# Patient Record
Sex: Female | Born: 1935 | Race: Black or African American | Hispanic: No | Smoking: Never smoker
Health system: Southern US, Community
[De-identification: ages and names within clinical notes are randomized; demographics above are authoritative.]

## PROBLEM LIST (undated history)

## (undated) DIAGNOSIS — I10 Essential (primary) hypertension: Secondary | ICD-10-CM

## (undated) DIAGNOSIS — D649 Anemia, unspecified: Secondary | ICD-10-CM

## (undated) DIAGNOSIS — E785 Hyperlipidemia, unspecified: Secondary | ICD-10-CM

## (undated) DIAGNOSIS — I2510 Atherosclerotic heart disease of native coronary artery without angina pectoris: Secondary | ICD-10-CM

## (undated) DIAGNOSIS — C189 Malignant neoplasm of colon, unspecified: Secondary | ICD-10-CM

## (undated) DIAGNOSIS — G629 Polyneuropathy, unspecified: Secondary | ICD-10-CM

## (undated) DIAGNOSIS — K219 Gastro-esophageal reflux disease without esophagitis: Secondary | ICD-10-CM

## (undated) DIAGNOSIS — R7303 Prediabetes: Secondary | ICD-10-CM

## (undated) DIAGNOSIS — C8330 Diffuse large B-cell lymphoma, unspecified site: Secondary | ICD-10-CM

## (undated) DIAGNOSIS — E119 Type 2 diabetes mellitus without complications: Secondary | ICD-10-CM

## (undated) DIAGNOSIS — I493 Ventricular premature depolarization: Secondary | ICD-10-CM

## (undated) HISTORY — PX: OTHER SURGICAL HISTORY: SHX169

## (undated) HISTORY — PX: HERNIA REPAIR: SHX51

## (undated) HISTORY — DX: Atherosclerotic heart disease of native coronary artery without angina pectoris: I25.10

## (undated) HISTORY — DX: Prediabetes: R73.03

## (undated) HISTORY — DX: Essential (primary) hypertension: I10

## (undated) HISTORY — DX: Malignant neoplasm of colon, unspecified: C18.9

## (undated) HISTORY — DX: Gastro-esophageal reflux disease without esophagitis: K21.9

## (undated) HISTORY — DX: Anemia, unspecified: D64.9

## (undated) HISTORY — DX: Polyneuropathy, unspecified: G62.9

## (undated) HISTORY — DX: Ventricular premature depolarization: I49.3

## (undated) HISTORY — DX: Hyperlipidemia, unspecified: E78.5

---

## 2005-08-11 ENCOUNTER — Observation Stay (HOSPITAL_COMMUNITY): Admission: EM | Admit: 2005-08-11 | Discharge: 2005-08-12 | Payer: Self-pay | Admitting: Emergency Medicine

## 2005-08-11 ENCOUNTER — Ambulatory Visit: Payer: Self-pay | Admitting: Cardiology

## 2005-12-11 ENCOUNTER — Emergency Department (HOSPITAL_COMMUNITY): Admission: EM | Admit: 2005-12-11 | Discharge: 2005-12-11 | Payer: Self-pay | Admitting: Emergency Medicine

## 2006-03-03 ENCOUNTER — Ambulatory Visit (HOSPITAL_COMMUNITY): Admission: RE | Admit: 2006-03-03 | Discharge: 2006-03-03 | Payer: Self-pay | Admitting: Family Medicine

## 2006-03-10 ENCOUNTER — Ambulatory Visit (HOSPITAL_COMMUNITY): Admission: RE | Admit: 2006-03-10 | Discharge: 2006-03-11 | Payer: Self-pay | Admitting: Family Medicine

## 2006-03-10 ENCOUNTER — Ambulatory Visit: Payer: Self-pay | Admitting: Cardiology

## 2006-03-13 ENCOUNTER — Ambulatory Visit: Payer: Self-pay | Admitting: *Deleted

## 2006-07-18 ENCOUNTER — Ambulatory Visit: Payer: Self-pay | Admitting: Family Medicine

## 2006-08-14 ENCOUNTER — Ambulatory Visit: Payer: Self-pay | Admitting: Family Medicine

## 2006-08-29 ENCOUNTER — Encounter (INDEPENDENT_AMBULATORY_CARE_PROVIDER_SITE_OTHER): Payer: Self-pay | Admitting: Family Medicine

## 2006-08-29 LAB — CONVERTED CEMR LAB
Blood Glucose, Fasting: 85 mg/dL
RBC count: 4.51 10*6/uL
WBC, blood: 5.1 10*3/uL

## 2006-09-11 ENCOUNTER — Ambulatory Visit: Payer: Self-pay | Admitting: Family Medicine

## 2006-09-17 ENCOUNTER — Ambulatory Visit (HOSPITAL_COMMUNITY): Admission: RE | Admit: 2006-09-17 | Discharge: 2006-09-17 | Payer: Self-pay | Admitting: *Deleted

## 2006-10-14 ENCOUNTER — Encounter: Payer: Self-pay | Admitting: Family Medicine

## 2006-10-15 DIAGNOSIS — J309 Allergic rhinitis, unspecified: Secondary | ICD-10-CM | POA: Insufficient documentation

## 2006-10-15 DIAGNOSIS — I10 Essential (primary) hypertension: Secondary | ICD-10-CM

## 2006-10-15 DIAGNOSIS — J40 Bronchitis, not specified as acute or chronic: Secondary | ICD-10-CM | POA: Insufficient documentation

## 2006-10-15 DIAGNOSIS — F411 Generalized anxiety disorder: Secondary | ICD-10-CM | POA: Insufficient documentation

## 2006-10-15 DIAGNOSIS — F329 Major depressive disorder, single episode, unspecified: Secondary | ICD-10-CM

## 2006-10-15 DIAGNOSIS — M545 Low back pain: Secondary | ICD-10-CM

## 2006-10-15 DIAGNOSIS — K219 Gastro-esophageal reflux disease without esophagitis: Secondary | ICD-10-CM

## 2006-10-15 DIAGNOSIS — E785 Hyperlipidemia, unspecified: Secondary | ICD-10-CM

## 2006-10-15 DIAGNOSIS — I252 Old myocardial infarction: Secondary | ICD-10-CM

## 2006-10-15 DIAGNOSIS — I2510 Atherosclerotic heart disease of native coronary artery without angina pectoris: Secondary | ICD-10-CM

## 2006-10-15 DIAGNOSIS — H409 Unspecified glaucoma: Secondary | ICD-10-CM | POA: Insufficient documentation

## 2006-10-23 ENCOUNTER — Ambulatory Visit: Payer: Self-pay | Admitting: Family Medicine

## 2006-11-13 ENCOUNTER — Ambulatory Visit: Payer: Self-pay | Admitting: Family Medicine

## 2006-11-13 LAB — CONVERTED CEMR LAB
AST: 20 units/L (ref 0–37)
Albumin: 4.6 g/dL (ref 3.5–5.2)
Alkaline Phosphatase: 117 units/L (ref 39–117)
BUN: 14 mg/dL (ref 6–23)
Potassium: 3.9 meq/L (ref 3.5–5.3)
Sodium: 143 meq/L (ref 135–145)
Total Protein: 7.6 g/dL (ref 6.0–8.3)

## 2006-12-18 ENCOUNTER — Encounter (INDEPENDENT_AMBULATORY_CARE_PROVIDER_SITE_OTHER): Payer: Self-pay | Admitting: Family Medicine

## 2006-12-29 ENCOUNTER — Ambulatory Visit: Payer: Self-pay | Admitting: Family Medicine

## 2006-12-29 DIAGNOSIS — M899 Disorder of bone, unspecified: Secondary | ICD-10-CM | POA: Insufficient documentation

## 2006-12-29 DIAGNOSIS — M949 Disorder of cartilage, unspecified: Secondary | ICD-10-CM

## 2006-12-29 LAB — CONVERTED CEMR LAB: HDL goal, serum: 40 mg/dL

## 2007-01-05 ENCOUNTER — Telehealth (INDEPENDENT_AMBULATORY_CARE_PROVIDER_SITE_OTHER): Payer: Self-pay | Admitting: Family Medicine

## 2007-01-09 ENCOUNTER — Ambulatory Visit (HOSPITAL_COMMUNITY): Admission: RE | Admit: 2007-01-09 | Discharge: 2007-01-09 | Payer: Self-pay | Admitting: Family Medicine

## 2007-01-12 ENCOUNTER — Encounter (INDEPENDENT_AMBULATORY_CARE_PROVIDER_SITE_OTHER): Payer: Self-pay | Admitting: Family Medicine

## 2007-01-26 ENCOUNTER — Ambulatory Visit: Payer: Self-pay | Admitting: Family Medicine

## 2007-01-28 ENCOUNTER — Encounter (INDEPENDENT_AMBULATORY_CARE_PROVIDER_SITE_OTHER): Payer: Self-pay | Admitting: Family Medicine

## 2007-03-16 ENCOUNTER — Ambulatory Visit: Payer: Self-pay | Admitting: Family Medicine

## 2007-03-17 ENCOUNTER — Encounter (INDEPENDENT_AMBULATORY_CARE_PROVIDER_SITE_OTHER): Payer: Self-pay | Admitting: Family Medicine

## 2007-03-17 LAB — CONVERTED CEMR LAB
AST: 21 units/L (ref 0–37)
Albumin: 4.2 g/dL (ref 3.5–5.2)
BUN: 12 mg/dL (ref 6–23)
Calcium: 8.9 mg/dL (ref 8.4–10.5)
Chloride: 109 meq/L (ref 96–112)
Potassium: 3.5 meq/L (ref 3.5–5.3)

## 2007-04-27 ENCOUNTER — Encounter (INDEPENDENT_AMBULATORY_CARE_PROVIDER_SITE_OTHER): Payer: Self-pay | Admitting: Family Medicine

## 2007-11-24 ENCOUNTER — Encounter (INDEPENDENT_AMBULATORY_CARE_PROVIDER_SITE_OTHER): Payer: Self-pay | Admitting: Family Medicine

## 2007-12-18 ENCOUNTER — Ambulatory Visit (HOSPITAL_COMMUNITY): Admission: RE | Admit: 2007-12-18 | Discharge: 2007-12-18 | Payer: Self-pay | Admitting: Family Medicine

## 2007-12-26 ENCOUNTER — Emergency Department (HOSPITAL_COMMUNITY): Admission: EM | Admit: 2007-12-26 | Discharge: 2007-12-27 | Payer: Self-pay | Admitting: Emergency Medicine

## 2008-01-01 ENCOUNTER — Encounter (INDEPENDENT_AMBULATORY_CARE_PROVIDER_SITE_OTHER): Payer: Self-pay | Admitting: Family Medicine

## 2008-01-22 ENCOUNTER — Encounter (INDEPENDENT_AMBULATORY_CARE_PROVIDER_SITE_OTHER): Payer: Self-pay | Admitting: Family Medicine

## 2008-01-31 ENCOUNTER — Emergency Department (HOSPITAL_COMMUNITY): Admission: EM | Admit: 2008-01-31 | Discharge: 2008-01-31 | Payer: Self-pay | Admitting: Emergency Medicine

## 2008-10-19 ENCOUNTER — Emergency Department (HOSPITAL_COMMUNITY): Admission: EM | Admit: 2008-10-19 | Discharge: 2008-10-19 | Payer: Self-pay | Admitting: Emergency Medicine

## 2009-02-03 ENCOUNTER — Ambulatory Visit (HOSPITAL_COMMUNITY): Admission: RE | Admit: 2009-02-03 | Discharge: 2009-02-03 | Payer: Self-pay | Admitting: Family Medicine

## 2009-07-04 ENCOUNTER — Emergency Department (HOSPITAL_COMMUNITY): Admission: EM | Admit: 2009-07-04 | Discharge: 2009-07-04 | Payer: Self-pay | Admitting: Emergency Medicine

## 2009-12-25 ENCOUNTER — Inpatient Hospital Stay (HOSPITAL_COMMUNITY): Admission: EM | Admit: 2009-12-25 | Discharge: 2009-12-28 | Payer: Self-pay | Admitting: Emergency Medicine

## 2009-12-27 ENCOUNTER — Encounter (INDEPENDENT_AMBULATORY_CARE_PROVIDER_SITE_OTHER): Payer: Self-pay | Admitting: Internal Medicine

## 2010-01-23 ENCOUNTER — Ambulatory Visit (HOSPITAL_COMMUNITY): Admission: RE | Admit: 2010-01-23 | Discharge: 2010-01-23 | Payer: Self-pay | Admitting: Family Medicine

## 2010-02-09 ENCOUNTER — Encounter: Admission: RE | Admit: 2010-02-09 | Discharge: 2010-02-09 | Payer: Self-pay | Admitting: Family Medicine

## 2010-09-19 ENCOUNTER — Emergency Department (HOSPITAL_COMMUNITY): Admission: EM | Admit: 2010-09-19 | Discharge: 2010-09-19 | Payer: Self-pay | Admitting: Emergency Medicine

## 2011-01-22 LAB — URINALYSIS, ROUTINE W REFLEX MICROSCOPIC
Glucose, UA: NEGATIVE mg/dL
Specific Gravity, Urine: 1.015 (ref 1.005–1.030)
pH: 6.5 (ref 5.0–8.0)

## 2011-01-22 LAB — CBC
Platelets: 172 10*3/uL (ref 150–400)
RBC: 4.13 MIL/uL (ref 3.87–5.11)
RDW: 14.3 % (ref 11.5–15.5)
WBC: 5.4 10*3/uL (ref 4.0–10.5)

## 2011-01-22 LAB — DIFFERENTIAL
Basophils Absolute: 0 10*3/uL (ref 0.0–0.1)
Eosinophils Absolute: 0.2 10*3/uL (ref 0.0–0.7)
Lymphocytes Relative: 37 % (ref 12–46)
Lymphs Abs: 2 10*3/uL (ref 0.7–4.0)
Neutrophils Relative %: 52 % (ref 43–77)

## 2011-01-22 LAB — BASIC METABOLIC PANEL
Calcium: 8.3 mg/dL — ABNORMAL LOW (ref 8.4–10.5)
Creatinine, Ser: 0.88 mg/dL (ref 0.4–1.2)
GFR calc Af Amer: >60 mL/min (ref >60)

## 2011-01-22 LAB — URINE CULTURE: Culture  Setup Time: 201111110334

## 2011-01-22 LAB — URINE MICROSCOPIC-ADD ON

## 2011-01-30 LAB — BASIC METABOLIC PANEL
CO2: 25 mEq/L (ref 19–32)
Calcium: 9.4 mg/dL (ref 8.4–10.5)
Chloride: 108 mEq/L (ref 96–112)
GFR calc Af Amer: >60 mL/min (ref >60)
Sodium: 141 mEq/L (ref 135–145)

## 2011-01-30 LAB — CBC
HCT: 35.9 % — ABNORMAL LOW (ref 36.0–46.0)
Hemoglobin: 12.1 g/dL (ref 12.0–15.0)
Hemoglobin: 12.2 g/dL (ref 12.0–15.0)
MCHC: 33.3 g/dL (ref 30.0–36.0)
MCHC: 33.6 g/dL (ref 30.0–36.0)
MCV: 83.3 fL (ref 78.0–100.0)
RBC: 4.37 MIL/uL (ref 3.87–5.11)
RDW: 14.3 % (ref 11.5–15.5)
WBC: 5.1 10*3/uL (ref 4.0–10.5)

## 2011-01-30 LAB — LIPID PANEL
LDL Cholesterol: 212 mg/dL — ABNORMAL HIGH (ref 0–99)
Total CHOL/HDL Ratio: 5 RATIO
Triglycerides: 93 mg/dL (ref <150)
VLDL: 19 mg/dL (ref 0–40)

## 2011-01-30 LAB — DIFFERENTIAL
Basophils Relative: 1 % (ref 0–1)
Basophils Relative: 1 % (ref 0–1)
Lymphs Abs: 2 10*3/uL (ref 0.7–4.0)
Monocytes Absolute: 0.3 10*3/uL (ref 0.1–1.0)
Monocytes Relative: 5 % (ref 3–12)
Monocytes Relative: 7 % (ref 3–12)
Neutro Abs: 1.7 10*3/uL (ref 1.7–7.7)
Neutro Abs: 2.7 10*3/uL (ref 1.7–7.7)
Neutrophils Relative %: 43 % (ref 43–77)
Neutrophils Relative %: 53 % (ref 43–77)

## 2011-01-30 LAB — URINE MICROSCOPIC-ADD ON

## 2011-01-30 LAB — PHOSPHORUS
Phosphorus: 3.6 mg/dL (ref 2.3–4.6)
Phosphorus: 3.9 mg/dL (ref 2.3–4.6)

## 2011-01-30 LAB — CK TOTAL AND CKMB (NOT AT ARMC)
CK, MB: 2.3 ng/mL (ref 0.3–4.0)
CK, MB: 2.9 ng/mL (ref 0.3–4.0)
Relative Index: 2 (ref 0.0–2.5)
Total CK: 132 U/L (ref 7–177)

## 2011-01-30 LAB — URINE CULTURE

## 2011-01-30 LAB — COMPREHENSIVE METABOLIC PANEL
Alkaline Phosphatase: 79 U/L (ref 39–117)
BUN: 11 mg/dL (ref 6–23)
Calcium: 9 mg/dL (ref 8.4–10.5)
Creatinine, Ser: 0.71 mg/dL (ref 0.4–1.2)
Glucose, Bld: 92 mg/dL (ref 70–99)
Potassium: 3.9 mEq/L (ref 3.5–5.1)
Total Protein: 6.6 g/dL (ref 6.0–8.3)

## 2011-01-30 LAB — URINALYSIS, ROUTINE W REFLEX MICROSCOPIC
Hgb urine dipstick: NEGATIVE
Nitrite: NEGATIVE
Specific Gravity, Urine: <1.005 — ABNORMAL LOW (ref 1.005–1.030)
Urobilinogen, UA: 0.2 mg/dL (ref 0.0–1.0)

## 2011-01-30 LAB — HEMOGLOBIN A1C: Hgb A1c MFr Bld: 6.3 % — ABNORMAL HIGH (ref 4.6–6.1)

## 2011-01-30 LAB — TSH: TSH: 2.777 u[IU]/mL (ref 0.350–4.500)

## 2011-01-30 LAB — VITAMIN B12: Vitamin B-12: 580 pg/mL (ref 211–911)

## 2011-01-30 LAB — POCT CARDIAC MARKERS
CKMB, poc: 1.7 ng/mL (ref 1.0–8.0)
Myoglobin, poc: 98 ng/mL (ref 12–200)
Troponin i, poc: <0.05 ng/mL (ref 0.00–0.09)

## 2011-02-16 LAB — URINALYSIS, ROUTINE W REFLEX MICROSCOPIC
Glucose, UA: NEGATIVE mg/dL
Nitrite: NEGATIVE
Protein, ur: NEGATIVE mg/dL
pH: 6 (ref 5.0–8.0)

## 2011-02-16 LAB — COMPREHENSIVE METABOLIC PANEL
BUN: 10 mg/dL (ref 6–23)
Calcium: 9.2 mg/dL (ref 8.4–10.5)
Creatinine, Ser: 0.86 mg/dL (ref 0.4–1.2)
Glucose, Bld: 106 mg/dL — ABNORMAL HIGH (ref 70–99)
Total Protein: 6.8 g/dL (ref 6.0–8.3)

## 2011-02-16 LAB — TROPONIN I: Troponin I: 0.03 ng/mL (ref 0.00–0.06)

## 2011-02-16 LAB — DIFFERENTIAL
Basophils Relative: 1 % (ref 0–1)
Lymphs Abs: 1.7 10*3/uL (ref 0.7–4.0)
Monocytes Relative: 7 % (ref 3–12)
Neutro Abs: 2.9 10*3/uL (ref 1.7–7.7)
Neutrophils Relative %: 56 % (ref 43–77)

## 2011-02-16 LAB — CK TOTAL AND CKMB (NOT AT ARMC)
CK, MB: 2.1 ng/mL (ref 0.3–4.0)
Total CK: 149 U/L (ref 7–177)

## 2011-02-16 LAB — CBC
MCHC: 34.1 g/dL (ref 30.0–36.0)
Platelets: 175 10*3/uL (ref 150–400)
RBC: 4.23 MIL/uL (ref 3.87–5.11)
RDW: 14.7 % (ref 11.5–15.5)

## 2011-03-29 NOTE — Procedures (Signed)
Evelyn Baird, Evelyn Baird                 ACCOUNT NO.:  [id number]   MEDICAL RECORD NO.:  [id number]          PATIENT TYPE:  OUT   LOCATION:  RAD                           FACILITY:  [facility]   PHYSICIAN:  [place] Bing, M.D. [facility] OF BIRTH:  1930/05/05   DATE OF PROCEDURE:  03/10/2006  DATE OF DISCHARGE:                                  ECHOCARDIOGRAM   REFERRING:  Dr. Loleta Chance.   CLINICAL DATA:  A 77 year old woman with hypertension and dizziness.   Aorta 3.  Left atrium 4.3.  Septum 1.4.  Posterior wall 1.3.  LV diastole  4.2.  LV systole 3.4.   IMPRESSION:  1.  Technically suboptimal but adequate echocardiographic study.  2.  Mild left atrial enlargement.  The right ventricle is prominent without      hypertrophy and with normal systolic function.  3.  Moderate aortic valvular sclerosis with mild impairment and leaflet      separation.  No stenosis by Doppler.  4.  Normal diameter of the aortic root with mild calcification of the wall.  5.  Normal tricuspid valve.  Mild regurgitation.  Normal estimated right      ventricular systolic pressure.  6.  Normal pulmonic valve and proximal pulmonary artery.  7.  Normal mitral valve.  Mild-to-moderate annular calcification.  Phylactic      coaptation with trivial regurgitation and no definite prolapse.  8.  Normal left ventricular size.  Mild hypertrophy.  Normal regional and      global function.  9.  Normal inferior vena cava.      [place] Bing, M.D. [facility]  Electronically Signed     RR/MEDQ  D:  03/10/2006  T:  03/11/2006  Job:  [id number]

## 2011-03-29 NOTE — H&P (Signed)
Evelyn Baird, Evelyn Baird                 ACCOUNT NO.:  [id number]   MEDICAL RECORD NO.:  [id number]          PATIENT TYPE:  INP   LOCATION:  A222                          FACILITY:  [facility]   PHYSICIAN:  Osvaldo Shipper, MD     DATE OF BIRTH:  75-Aug-1931   DATE OF ADMISSION:  08/11/2005  DATE OF DISCHARGE:  LH                                HISTORY & PHYSICAL   PRIMARY CARE PHYSICIAN:  Dr. Jorene Guest in [place].   ADMISSION DIAGNOSES:  1.  Near syncope.  2.  Pulsatile tinnitus.  3.  History of coronary artery disease.  4.  Hypertension.   CHIEF COMPLAINT:  Near syncopal episode last night.   HISTORY OF PRESENT ILLNESS:  The patient is a 77 year old African-American  female with past medical history of hypertension, coronary artery disease  who presented to the emergency department today after having a near syncopal  episode yesterday. The patient was in a store doing grocery shopping, when  she suddenly felt light headed. The patient denied having symptoms of  vertigo and she felt like she was going to pass out. She fell backwards,  more towards her left side but did not hurt her head anywhere. There was no  syncopal episode, however. The patient maintained that she remained  conscious throughout the process. However, did feel profoundly light headed.  She also complained of a mild headache at that time but nothing severe. The  patient gives a significant history of tinnitus, more so in her right ear,  which is pulsatile in nature, which she has been having for a few weeks on  and off. She did feel this sensation yesterday. The patient subsequent sat  down on the floor. She was helped to her feet by her daughter. Subsequently  was taken to a hospital. However, she had a long waiting period in that  hospital and she was taken to another hospital in [place]. The patient was  seen apparently by a physician, did not have any tests done over there and  was discharged home. This morning, the  patient woke up and still felt that  her symptoms had not gone away. She was still feeling light headed and  decided to come into this emergency department. There is no history  suggestive of vertigo. At no point did she have any chest pain,  palpitations, or shortness of breath. The patient does give a history of  dyspnea on exertion, however. There was no history of any seizure type of  activity. No history of urinary or stool incontinence. There is no history  of any focal weakness. No history of nausea, vomiting, or abdominal pain. No  history of any dysuria or hematuria.   PAST MEDICAL HISTORY:  The patient had a hysterectomy with bilateral  salpingo-oophorectomy along with a hernia repair about a few months ago.   MEDICATIONS:  The patient is not compliant with her medications. She does  not know the names of most of her medications. She states that she is  supposed to be taking a lot of medications, however, she has discontinued  them. She is currently on the following:  1.  Nitroglycerin for chest pain. The last used was Friday.  2.  Ibuprofen p.r.n.  3.  Zetia unknown dose daily, however, she has not been taking it for a      weak, as she has been feeling sick to her stomach.  Her pharmacy is [facility] in [place]. The phone number is [phone]. I did call them, however, they are closed at this time. The patient's  daughters also do not know the names of her medications.   ALLERGIES:  No known drug allergies.   SOCIAL HISTORY:  The patient lives in [place]. Lives alone. However, she  ambulates independently and does all of her activities of daily living  herself. No history of any alcohol use or any drug use. No recent history of  any smoking use. She used to be a smoker in the past, on and off, but never  a regular smoker.   FAMILY HISTORY:  Kidney disease in father. Otherwise, the patient does not  know much medical problems in her family.   REVIEW OF  SYSTEMS:  A 10 point review of systems was done, which was  remarkable for persistent symptoms of light headedness as well as  intermittent pulsatile tinnitus.   PHYSICAL EXAMINATION:  VITAL SIGNS:  Temperature 99.1, blood pressure  initially 151/67. Occasional she is known to become hypertensive with 150's  over 100. The patient did have orthostatic done, which are as follows:  Lying down blood pressure 158/89, heart rate 61. Sitting up blood pressure  149/86, heart rate 68. Standing up blood pressure 137/92, heart rate 77.  Respiratory rate is 14. Saturation 97% on room air.  GENERAL:  A well developed, well nourished female in no apparent distress.  HEENT:  No pallor, no icterus. Oral mucus membranes moist. No oral lesions  are seen. Examination of the ears bilaterally did not reveal any blood.  Tympanic membranes were present with good light reflex.  NECK:  Soft and supple with full range of motion with no reproducibility of  her symptoms.  LUNGS:  Few crackles at the base, clearing with cough. Otherwise,  unremarkable.  CARDIOVASCULAR:  S1 and S2 is normal with a few premature beats, consistent  with premature ventricular contractions as seen on telemetry. There is a  systolic murmur appreciated in the aortic area. Does not seem to radiate to  the carotids, however. No S3 or S4 heard. No carotid bruits were heard. No  jugular venous distention was seen.  ABDOMEN:  Soft, nontender, and nondistended. Bowel sounds are present. No  mass or organomegaly appreciated. Please note that there is some diffuse  mild tenderness present but there is no guarding or rigidity.  EXTREMITIES:  Without any edema. Peripheral pulses are palpable.  NEUROLOGIC:  The patient is alert and oriented x3. Cranial nerve examination  within normal limits. No nystagmus was noted. Motor examination, the patient had 5 out of 5 strength to both upper and lower extremities bilaterally.  Cerebellar signs were within  normal limits. Gait appeared to be normal.  Romberg sign was negative. Sensory examination within normal limits though  limited somewhat. Tone within normal limits. Reflexes within normal limits,  1+ bilaterally and equal. Plantar's downgoing.   LABORATORY DATA:  The patient has had limited laboratory analysis, however,  includes venous blood gas of pH 7.44, pCO 734, hemoglobin 13.9. No white  count or platelet count available. Sodium 139, potassium 3.8, chloride 110,  glucose 84, BUN 9, creatinine 0.8. No other parameters available. No liver  function studies available. CK MB 1.2, troponin less than 0.05. UA negative  for any abnormalities.   Chest x-ray showed significant for cardiomegaly and tiny nodular densities  in the left upper lung region. Followup recommended in 3 months.   EKG shows sinus rhythm with a normal axis. No significant Q waves  appreciated. Intervals appear to be within normal limits. Premature  ventricular contractions is seen in this electrocardiogram. No significant  ST or T wave abnormalities are noted on this electrocardiogram.   IMPRESSION:  This is a 77 year old African-American female with a past  medical history of coronary artery disease, hypertension, who presents to  the emergency department with an episode of near syncope last night with  symptoms continuing somewhat this morning. Differential diagnoses include  cardiac etiology considering the premature ventricular contractions that are  seen on her electrocardiogram. It is possible that she might have had some  kind of arrhythmia. We do hear a systolic murmur in the aortic area. It is  possible that patient has aortic stenosis, which should also be considered.  Ischemic event is unlikely, although it will be prudent to rule it out. The  patient gives a history of pulsatile tinnitus, which is concerning for  vascular malformations intracranially. The patient did have orthostatic's  done, which  suggest mild orthostasis, although she did not have any symptoms  when this testing was done.   PLAN:  NEAR SYNCOPE:  Will get the patient to telemetry. Will do a CT scan  of the head for now to rule out any acute cerebrovascular event. We will  rule the patient out for acute coronary syndrome. Will keep the patient on  telemetry to rule out any arrhythmias. A 2-D echocardiogram will be checked  to evaluate her enlarged heart, as well as the murmur heard on examination  to rule out significant valvular disease. The pulsatile tinnitus is  concerning, hence MRI and MRA will be ordered for tomorrow morning.   The patient is not very compliant with her medications. We are unable to get  her medication list at this time. I did try to call the pharmacy but was  unable to reach them because they are closed. The number is mentioned earlier in this dictation and attempt should be made tomorrow to get her  medication list. I have also asked her family to bring in her medications.  The patient will be started on an anti-hypertensive if her blood pressure  stays elevated tonight. Further management decisions will be based on  results of initial testing, as patient responds to treatment.      Osvaldo Shipper, MD  Electronically Signed     GK/MEDQ  D:  08/11/2005  T:  08/11/2005  Job:  [id number]   cc:   Dr. Sheilah Pigeon, [place]

## 2011-03-29 NOTE — Procedures (Signed)
Evelyn Baird, Evelyn Baird                 ACCOUNT NO.:  [id number]   MEDICAL RECORD NO.:  [id number]          PATIENT TYPE:  INP   LOCATION:  A222                          FACILITY:  [facility]   PHYSICIAN:  [place] Bing, M.D. [facility] OF BIRTH:  [birth year]   DATE OF PROCEDURE:  08/12/2005  DATE OF DISCHARGE:  08/12/2005                                  ECHOCARDIOGRAM   REFERRING:  Osvaldo Shipper, M.D.   CLINICAL DATA:  A 77 year old woman with syncope, hypertension and coronary  disease.   M-MODE:  Aorta 3.3, left atrium 4.6, septum 1.2, posterior wall 1.3, LV  diastole 4.9, LV systole 3.8.   1.  Technically adequate echocardiographic study.  2.  Left atrial size at the upper limit of normal; normal right atrium.  3.  Mild right ventricular dilatation; no right ventricular hypertrophy;      normal right ventricular systolic function.  4.  Trileaflet aortic valve; moderate sclerosis of the leaflets; minimally      decreased leaflet excursion. No stenosis nor insufficiency by Doppler.  5.  Normal mitral valve; mild annular calcification; trace regurgitation.  6.  Normal tricuspid valve; physiologic regurgitation; estimated right      ventricular systolic pressure at the upper limit of normal to slightly      increased.  7.  Normal pulmonic valve and proximal pulmonary artery.  8.  Normal left ventricular size; borderline hypertrophy; normal regional      and global function.  9.  Normal inferior vena cava.      [place] Bing, M.D. [facility]  Electronically Signed     RR/MEDQ  D:  08/12/2005  T:  08/12/2005  Job:  [id number]

## 2011-07-31 ENCOUNTER — Other Ambulatory Visit (HOSPITAL_COMMUNITY): Payer: Self-pay | Admitting: Family Medicine

## 2011-07-31 DIAGNOSIS — M79605 Pain in left leg: Secondary | ICD-10-CM

## 2011-08-02 ENCOUNTER — Ambulatory Visit (HOSPITAL_COMMUNITY): Payer: PRIVATE HEALTH INSURANCE

## 2011-08-02 LAB — POCT CARDIAC MARKERS
CKMB, poc: 1.2
CKMB, poc: 1.7
Myoglobin, poc: 57.9
Myoglobin, poc: 59.1
Operator id: 208461
Operator id: 215201
Troponin i, poc: <0.05
Troponin i, poc: <0.05

## 2011-08-02 LAB — BASIC METABOLIC PANEL WITH GFR
BUN: 11
CO2: 27
Calcium: 9
Chloride: 107
Creatinine, Ser: 0.73
GFR calc non Af Amer: >60
Glucose, Bld: 104 — ABNORMAL HIGH
Potassium: 3.4 — ABNORMAL LOW
Sodium: 140

## 2011-08-02 LAB — CBC
HCT: 31.5 — ABNORMAL LOW
Hemoglobin: 10.9 — ABNORMAL LOW
MCHC: 34.7
MCV: 81.4
Platelets: 215
RBC: 3.87
RDW: 14.3
WBC: 5.5

## 2011-08-02 LAB — DIFFERENTIAL
Eosinophils Absolute: 0.2
Lymphocytes Relative: 42
Lymphs Abs: 2.3
Monocytes Relative: 8
Neutro Abs: 2.5
Neutrophils Relative %: 46

## 2011-08-05 LAB — BASIC METABOLIC PANEL
BUN: 7
Chloride: 107
GFR calc Af Amer: >60
Potassium: 3.6
Sodium: 139

## 2011-08-05 LAB — DIFFERENTIAL
Eosinophils Absolute: 0.2
Eosinophils Relative: 4
Lymphocytes Relative: 31
Lymphs Abs: 1.5
Monocytes Relative: 7

## 2011-08-05 LAB — CBC
HCT: 33.7 — ABNORMAL LOW
Hemoglobin: 11.6 — ABNORMAL LOW
MCV: 81.6
RBC: 4.13
WBC: 4.9

## 2011-08-05 LAB — POCT CARDIAC MARKERS
Myoglobin, poc: 55
Operator id: 211291
Troponin i, poc: <0.05
Troponin i, poc: <0.05

## 2011-08-16 LAB — POCT CARDIAC MARKERS
CKMB, poc: <1 ng/mL — ABNORMAL LOW (ref 1.0–8.0)
Myoglobin, poc: 54.9 ng/mL (ref 12–200)

## 2011-08-16 LAB — CBC
MCV: 83.9 fL (ref 78.0–100.0)
Platelets: 195 10*3/uL (ref 150–400)
RDW: 14.4 % (ref 11.5–15.5)
WBC: 5.7 10*3/uL (ref 4.0–10.5)

## 2011-08-16 LAB — COMPREHENSIVE METABOLIC PANEL
AST: 24 U/L (ref 0–37)
Albumin: 3.8 g/dL (ref 3.5–5.2)
BUN: 8 mg/dL (ref 6–23)
Chloride: 110 mEq/L (ref 96–112)
Creatinine, Ser: 0.86 mg/dL (ref 0.4–1.2)
GFR calc Af Amer: >60 mL/min (ref >60)
Potassium: 3.6 mEq/L (ref 3.5–5.1)
Total Bilirubin: 0.4 mg/dL (ref 0.3–1.2)
Total Protein: 6.9 g/dL (ref 6.0–8.3)

## 2011-08-16 LAB — URINE MICROSCOPIC-ADD ON

## 2011-08-16 LAB — URINALYSIS, ROUTINE W REFLEX MICROSCOPIC
Bilirubin Urine: NEGATIVE
Glucose, UA: NEGATIVE mg/dL
Hgb urine dipstick: NEGATIVE
Specific Gravity, Urine: 1.01 (ref 1.005–1.030)
Urobilinogen, UA: 0.2 mg/dL (ref 0.0–1.0)
pH: 5.5 (ref 5.0–8.0)

## 2011-08-16 LAB — DIFFERENTIAL
Basophils Absolute: 0 10*3/uL (ref 0.0–0.1)
Eosinophils Relative: 3 % (ref 0–5)
Lymphocytes Relative: 33 % (ref 12–46)
Monocytes Absolute: 0.4 10*3/uL (ref 0.1–1.0)
Monocytes Relative: 7 % (ref 3–12)
Neutro Abs: 3.2 10*3/uL (ref 1.7–7.7)

## 2012-02-25 ENCOUNTER — Encounter: Payer: Self-pay | Admitting: Cardiology

## 2012-04-02 ENCOUNTER — Encounter: Payer: Self-pay | Admitting: Cardiology

## 2012-04-03 ENCOUNTER — Encounter: Payer: Self-pay | Admitting: Cardiology

## 2012-04-03 ENCOUNTER — Ambulatory Visit (INDEPENDENT_AMBULATORY_CARE_PROVIDER_SITE_OTHER): Payer: PRIVATE HEALTH INSURANCE | Admitting: Cardiology

## 2012-04-03 VITALS — BP 134/83 | HR 83 | Resp 16 | Ht 65.0 in | Wt 133.0 lb

## 2012-04-03 DIAGNOSIS — E785 Hyperlipidemia, unspecified: Secondary | ICD-10-CM

## 2012-04-03 DIAGNOSIS — I359 Nonrheumatic aortic valve disorder, unspecified: Secondary | ICD-10-CM | POA: Insufficient documentation

## 2012-04-03 DIAGNOSIS — R072 Precordial pain: Secondary | ICD-10-CM

## 2012-04-03 DIAGNOSIS — I10 Essential (primary) hypertension: Secondary | ICD-10-CM

## 2012-04-03 DIAGNOSIS — I2510 Atherosclerotic heart disease of native coronary artery without angina pectoris: Secondary | ICD-10-CM

## 2012-04-03 NOTE — Patient Instructions (Signed)
**Note De-identified Chevonne Bostrom Obfuscation** Your physician has requested that you have an echocardiogram. Echocardiography is a painless test that uses sound waves to create images of your heart. It provides your doctor with information about the size and shape of your heart and how well your heart's chambers and valves are working. This procedure takes approximately one hour. There are no restrictions for this procedure.  Your physician recommends that you schedule a follow-up appointment in: 6 months.  

## 2012-04-03 NOTE — Assessment & Plan Note (Signed)
Aortic murmur with sclerosis noted by echocardiogram in 2011. Plan followup echocardiogram for reassessment. At that time LV function was normal.

## 2012-04-03 NOTE — Assessment & Plan Note (Signed)
Recent lipid numbers reviewed, on Lipitor. Would ideally aim for LDL under 100.

## 2012-04-03 NOTE — Assessment & Plan Note (Signed)
Continue medical therapy, sodium restriction. 

## 2012-04-03 NOTE — Assessment & Plan Note (Signed)
Atypical in description, and actually very infrequent per patient report. Recent ECGs reviewed. She is not reporting exertional chest pain, and her symptoms could be GI in origin. Continue observation for now.

## 2012-04-03 NOTE — Progress Notes (Signed)
Clinical Summary Evelyn Baird is a 78 y.o.female referred for cardiology consultation by Evelyn Baird at Evelyn Baird. Record review finds prior cardiology followup with Evelyn Baird and a history of mild to moderate nonobstructive CAD documented at cardiac catheterization in 2009. Most recent echocardiogram from 2011 is noted below.  She states that she has actually had much fewer episodes of chest pain since she has been taking medications regularly. She typically describes a brief sharp discomfort in her chest, sometimes precipitated by certain foods, not clearly exertional. She reports NYHA class II dyspnea on exertion. No significant peripheral edema or orthopnea.  ECG from April reviewed showing sinus rhythm with left axis deviation, occasional PVCs, nonspecific T-wave changes. Lab work from April reviewed showing hemoglobin 12.6, platelets 232, cholesterol 214, triglycerides 62, HDL 85, LDL 117, TSH 1.7.  She denies any sense of palpitations, has had no syncope. Previous documentation of PVCs are noted.   No Known Allergies  Current Outpatient Prescriptions  Medication Sig Dispense Refill  . aspirin 81 MG tablet Take 81 mg by mouth daily.      Marland Kitchen atorvastatin (LIPITOR) 40 MG tablet Take 40 mg by mouth daily.      . Calcium Carbonate-Vitamin D (CALCIUM + D PO) Take by mouth daily.      Marland Kitchen ezetimibe (ZETIA) 10 MG tablet Take 10 mg by mouth daily.      Marland Kitchen gabapentin (NEURONTIN) 100 MG capsule Take 100 mg by mouth daily.      Marland Kitchen lisinopril (PRINIVIL,ZESTRIL) 20 MG tablet Take 20 mg by mouth daily.      . nitroGLYCERIN (NITROSTAT) 0.4 MG SL tablet Place 0.4 mg under the tongue every 5 (five) minutes as needed.      Marland Kitchen omeprazole (PRILOSEC) 20 MG capsule Take 20 mg by mouth daily.      . Potassium 99 MG TABS Take by mouth daily.        Past Medical History  Diagnosis Date  . Essential hypertension, benign   . Hyperlipidemia   . Coronary atherosclerosis of native coronary artery    Nonobstructive 2009 - previously followed by Evelyn Baird  . Peripheral neuropathy   . Prediabetes   . PVC's (premature ventricular contractions)     Past Surgical History  Procedure Date  . Left abdominal hernia repair     Family History  Problem Relation Age of Onset  . Hypertension Father   . Kidney disease Father     Social History Evelyn Baird reports that she has never smoked. She has never used smokeless tobacco. Evelyn Baird reports that she does not drink alcohol.  Review of Systems Stable appetite. Reflux symptoms. No reported melena or hematochezia. Functionally active with her ADLs. No falls. No bleeding problems. Occasional mild coughing, nonproductive, no fevers or chills. Otherwise negative.  Physical Examination Filed Vitals:   04/03/12 0904  BP: 134/83  Pulse: 83  Resp: 16   Normally nourished appearing elderly woman in no acute distress. HEENT: Conjunctiva and lids normal, oropharynx clear. Neck: Supple, no elevated JVP or carotid bruits, no thyromegaly. Lungs: Clear to auscultation, nonlabored breathing at rest. Cardiac: Regular rate and rhythm, no S3, 2/6 systolic murmur, no pericardial rub. Abdomen: Soft, nontender, bowel sounds present, no guarding or rebound. Extremities: No pitting edema, distal pulses 2+. Skin: Warm and dry. Musculoskeletal: No kyphosis. Neuropsychiatric: Alert and oriented x3, affect grossly appropriate.  ECG Reviewed in EMR.  Studies Echocardiogram February 2011 Study Conclusions  - Left ventricle: The cavity size was normal.  Systolic function was normal. The estimated ejection fraction was in the range of 55% to 60%. Wall motion was normal; there were no regional wall motion abnormalities. Doppler parameters are consistent with abnormal left ventricular relaxation (grade 1 diastolic dysfunction). Doppler parameters are consistent with elevated mean left atrial filling pressure. - Aortic valve: Mildly calcified leaflets.  Sclerosis without stenosis involving predominantly the noncoronary cusp. Valve area: 2.06cm^2(VTI). Valve area: 1.74cm^2 (Vmax). - Mitral valve: Calcified annulus. Mild regurgitation. - Left atrium: The atrium was mildly dilated. - Tricuspid valve: Moderate regurgitation.   Problem List and Plan   Precordial pain Atypical in description, and actually very infrequent per patient report. Recent ECGs reviewed. She is not reporting exertional chest pain, and her symptoms could be GI in origin. Continue observation for now.  Coronary atherosclerosis of native coronary artery History of mild to moderate nonobstructive CAD by catheterization in 2009, previously followed by Evelyn Baird. Would continue medical therapy and observation. Reinforced compliance with medications including her aspirin and statin therapy.  Essential hypertension, benign Continue medical therapy, sodium restriction.  HYPERLIPIDEMIA Recent lipid numbers reviewed, on Lipitor. Would ideally aim for LDL under 100.  Aortic valve disorders Aortic murmur with sclerosis noted by echocardiogram in 2011. Plan followup echocardiogram for reassessment. At that time LV function was normal.     Evelyn Baird, M.D., F.A.C.C.

## 2012-04-03 NOTE — Assessment & Plan Note (Signed)
History of mild to moderate nonobstructive CAD by catheterization in 2009, previously followed by [facility]. Would continue medical therapy and observation. Reinforced compliance with medications including her aspirin and statin therapy.

## 2012-04-17 ENCOUNTER — Ambulatory Visit (HOSPITAL_COMMUNITY)
Admission: RE | Admit: 2012-04-17 | Discharge: 2012-04-17 | Disposition: A | Discharge status: Home / Self Care | Payer: PRIVATE HEALTH INSURANCE | Source: Ambulatory Visit | Attending: Cardiology | Admitting: Cardiology

## 2012-04-17 DIAGNOSIS — I359 Nonrheumatic aortic valve disorder, unspecified: Secondary | ICD-10-CM

## 2012-04-17 DIAGNOSIS — E785 Hyperlipidemia, unspecified: Secondary | ICD-10-CM | POA: Insufficient documentation

## 2012-04-17 DIAGNOSIS — I10 Essential (primary) hypertension: Secondary | ICD-10-CM | POA: Insufficient documentation

## 2012-04-17 NOTE — Progress Notes (Signed)
*  PRELIMINARY RESULTS* Echocardiogram 2D Echocardiogram has been performed.  Evelyn Baird 04/17/2012, 11:58 AM

## 2012-04-24 ENCOUNTER — Encounter: Payer: Self-pay | Admitting: Cardiology

## 2012-09-14 ENCOUNTER — Emergency Department (HOSPITAL_COMMUNITY)
Admission: EM | Admit: 2012-09-14 | Discharge: 2012-09-14 | Disposition: A | Discharge status: Home / Self Care | Payer: PRIVATE HEALTH INSURANCE | Attending: Emergency Medicine | Admitting: Emergency Medicine

## 2012-09-14 ENCOUNTER — Encounter (HOSPITAL_COMMUNITY): Payer: Self-pay

## 2012-09-14 ENCOUNTER — Emergency Department (HOSPITAL_COMMUNITY): Payer: PRIVATE HEALTH INSURANCE

## 2012-09-14 DIAGNOSIS — D649 Anemia, unspecified: Secondary | ICD-10-CM

## 2012-09-14 DIAGNOSIS — Z7982 Long term (current) use of aspirin: Secondary | ICD-10-CM | POA: Insufficient documentation

## 2012-09-14 DIAGNOSIS — Z8669 Personal history of other diseases of the nervous system and sense organs: Secondary | ICD-10-CM | POA: Insufficient documentation

## 2012-09-14 DIAGNOSIS — E785 Hyperlipidemia, unspecified: Secondary | ICD-10-CM | POA: Insufficient documentation

## 2012-09-14 DIAGNOSIS — I10 Essential (primary) hypertension: Secondary | ICD-10-CM | POA: Insufficient documentation

## 2012-09-14 DIAGNOSIS — Z79899 Other long term (current) drug therapy: Secondary | ICD-10-CM | POA: Insufficient documentation

## 2012-09-14 DIAGNOSIS — I2510 Atherosclerotic heart disease of native coronary artery without angina pectoris: Secondary | ICD-10-CM | POA: Insufficient documentation

## 2012-09-14 DIAGNOSIS — I4949 Other premature depolarization: Secondary | ICD-10-CM | POA: Insufficient documentation

## 2012-09-14 LAB — BASIC METABOLIC PANEL
BUN: 11 mg/dL (ref 6–23)
Creatinine, Ser: 0.75 mg/dL (ref 0.50–1.10)
GFR calc Af Amer: >90 mL/min (ref >90)
GFR calc non Af Amer: 78 mL/min — ABNORMAL LOW (ref >90)
Potassium: 3.4 mEq/L — ABNORMAL LOW (ref 3.5–5.1)

## 2012-09-14 LAB — CBC WITH DIFFERENTIAL/PLATELET
Basophils Absolute: 0 10*3/uL (ref 0.0–0.1)
Basophils Relative: 0 % (ref 0–1)
Eosinophils Absolute: 0.1 10*3/uL (ref 0.0–0.7)
Hemoglobin: 9.8 g/dL — ABNORMAL LOW (ref 12.0–15.0)
MCH: 25.7 pg — ABNORMAL LOW (ref 26.0–34.0)
MCHC: 32.1 g/dL (ref 30.0–36.0)
Monocytes Relative: 6 % (ref 3–12)
Neutrophils Relative %: 56 % (ref 43–77)
RDW: 14.7 % (ref 11.5–15.5)

## 2012-09-14 NOTE — ED Notes (Signed)
Patient with no complaints at this time. Respirations even and unlabored. Skin warm/dry. Discharge instructions reviewed with patient at this time. Patient given opportunity to voice concerns/ask questions. IV removed per policy and band-aid applied to site. Patient discharged at this time and left Emergency Department with steady gait.  

## 2012-09-14 NOTE — ED Provider Notes (Signed)
History   This chart was scribed for Donnetta Hutching, MD by Charolett Bumpers . The patient was seen in room APA06/APA06. Patient's care was started at 1451.   CSN: [id number]  Arrival date & time 09/14/12  1302   First MD Initiated Contact with Patient 09/14/12 1451      Chief Complaint  Patient presents with  . Chest Pain    The history is provided by the patient. No language interpreter was used.   Evelyn Baird is a 78 y.o. female who presents to the Emergency Department complaining of moderate left-sided chest pain under left breast that occurred yesterday. She states she had chest pain yesterday and took nitroglycerin that resolved the chest pain. She states she was seen by her PCP for a check up this morning where an EKG was preformed and told to go to ED for further evaluation for ab. She reports a h/o chest pain that she takes nitroglycerin for. She states that she feels at baseline currently. She reports a h/o MI several years ago. She denies any h/o anemia.   Cardiologist: Corinda Gubler   Past Medical History  Diagnosis Date  . Essential hypertension, benign   . Hyperlipidemia   . Coronary atherosclerosis of native coronary artery     Nonobstructive 2009 - previously followed by [facility]  . Peripheral neuropathy   . Prediabetes   . PVC's (premature ventricular contractions)     Past Surgical History  Procedure Date  . Left abdominal hernia repair   . Hernia repair     Family History  Problem Relation Age of Onset  . Hypertension Father   . Kidney disease Father     History  Substance Use Topics  . Smoking status: Never Smoker   . Smokeless tobacco: Never Used  . Alcohol Use: No    OB History    Grav Para Term Preterm Abortions TAB SAB Ect Mult Living                  Review of Systems A complete 10 system review of systems was obtained and all systems are negative except as noted in the HPI and PMH.   Allergies  Review of patient's allergies indicates no  known allergies.  Home Medications   Current Outpatient Rx  Name  Route  Sig  Dispense  Refill  . ASPIRIN EC 81 MG PO TBEC   Oral   Take 81 mg by mouth daily.         . ATORVASTATIN CALCIUM 40 MG PO TABS   Oral   Take 40 mg by mouth daily.         Marland Kitchen BISACODYL 5 MG PO TBEC   Oral   Take 5 mg by mouth daily as needed. Constipation.         Marland Kitchen CALCIUM + D PO   Oral   Take 1 tablet by mouth daily.          Marland Kitchen DIPHENHYDRAMINE HCL 25 MG PO TABS   Oral   Take 25 mg by mouth every 6 (six) hours as needed. Itching.         Marland Kitchen EZETIMIBE 10 MG PO TABS   Oral   Take 10 mg by mouth daily.         Marland Kitchen GABAPENTIN 100 MG PO CAPS   Oral   Take 100 mg by mouth daily.         . [employer] LEG CRAMPS PAIN RELIEF PO  Oral   Take 1 tablet by mouth daily as needed. Foot cramps.         Marland Kitchen HYDROCODONE-ACETAMINOPHEN 5-500 MG PO TABS   Oral   Take 1 tablet by mouth every 4 (four) hours as needed. Pain.         Marland Kitchen LISINOPRIL 20 MG PO TABS   Oral   Take 20 mg by mouth daily.         Marland Kitchen NITROGLYCERIN 0.4 MG SL SUBL   Sublingual   Place 0.4 mg under the tongue every 5 (five) minutes as needed. Chest pain.         Marland Kitchen OMEPRAZOLE 20 MG PO CPDR   Oral   Take 20 mg by mouth daily.         Marland Kitchen POTASSIUM 99 MG PO TABS   Oral   Take 1 tablet by mouth daily.            BP 123/63  Pulse 83  Temp 98.6 F (37 C) (Oral)  Resp 14  Ht 5\' 4"  (1.626 m)  Wt 135 lb (61.236 kg)  BMI 23.17 kg/m2  SpO2 100%  Physical Exam  Nursing note and vitals reviewed. Constitutional: She is oriented to person, place, and time. She appears well-developed and well-nourished.  HENT:  Head: Normocephalic and atraumatic.  Eyes: Conjunctivae normal and EOM are normal. Pupils are equal, round, and reactive to light.  Neck: Normal range of motion. Neck supple.  Cardiovascular: Normal rate, regular rhythm and normal heart sounds.   No murmur heard. Pulmonary/Chest: Effort normal and breath sounds  normal. No respiratory distress. She has no wheezes.  Abdominal: Soft. Bowel sounds are normal. She exhibits no distension. There is no tenderness.  Musculoskeletal: Normal range of motion. She exhibits no edema.  Neurological: She is alert and oriented to person, place, and time. No cranial nerve deficit.  Skin: Skin is warm and dry.  Psychiatric: She has a normal mood and affect.    ED Course  Procedures (including critical care time)  DIAGNOSTIC STUDIES: Oxygen Saturation is 100% on room air, normal by my interpretation.    COORDINATION OF CARE:  15:27-Informed pt of lab results and findings of anemia. Upon review of old records, pt's hemoglobin appears to have slightly dropped since 2011. Will d/c pt home and have f/u with PCP. Pt and family are agreeable at this time.   Results for orders placed during the hospital encounter of 09/14/12  CBC WITH DIFFERENTIAL      Component Value Range   WBC 4.7  4.0 - 10.5 K/uL   RBC 3.81 (*) 3.87 - 5.11 MIL/uL   Hemoglobin 9.8 (*) 12.0 - 15.0 g/dL   HCT 45.4 (*) 09.8 - 11.9 %   MCV 80.1  78.0 - 100.0 fL   MCH 25.7 (*) 26.0 - 34.0 pg   MCHC 32.1  30.0 - 36.0 g/dL   RDW 14.7  82.9 - 56.2 %   Platelets 200  150 - 400 K/uL   Neutrophils Relative 56  43 - 77 %   Neutro Abs 2.7  1.7 - 7.7 K/uL   Lymphocytes Relative 34  12 - 46 %   Lymphs Abs 1.6  0.7 - 4.0 K/uL   Monocytes Relative 6  3 - 12 %   Monocytes Absolute 0.3  0.1 - 1.0 K/uL   Eosinophils Relative 3  0 - 5 %   Eosinophils Absolute 0.1  0.0 - 0.7 K/uL   Basophils Relative 0  0 - 1 %   Basophils Absolute 0.0  0.0 - 0.1 K/uL  BASIC METABOLIC PANEL      Component Value Range   Sodium 141  135 - 145 mEq/L   Potassium 3.4 (*) 3.5 - 5.1 mEq/L   Chloride 106  96 - 112 mEq/L   CO2 26  19 - 32 mEq/L   Glucose, Bld 126 (*) 70 - 99 mg/dL   BUN 11  6 - 23 mg/dL   Creatinine, Ser 1.61  0.50 - 1.10 mg/dL   Calcium 9.6  8.4 - 09.6 mg/dL   GFR calc non Af Amer 78 (*) >90 mL/min   GFR calc  Af Amer >90  >90 mL/min  TROPONIN I      Component Value Range   Troponin I <0.30  <0.30 ng/mL    Dg Chest Portable 1 View  09/14/2012  *RADIOLOGY REPORT*  Clinical Data: Chest pain.  PORTABLE CHEST - 1 VIEW  Comparison: 09/19/2010  Findings: Cardiomegaly.  Tortuosity of the thoracic aorta. Calcified granuloma in the left upper lobe.  No confluent opacities in the lungs.  No effusions.  No acute bony abnormality.  IMPRESSION: Cardiomegaly.  No acute findings.   Original Report Authenticated By: Charlett Nose, M.D.      No diagnosis found.  Date: 09/14/2012  Rate: 67  Rhythm: normal sinus rhythm  QRS Axis: normal  Intervals: normal  ST/T Wave abnormalities: normal  Conduction Disutrbances: none  Narrative Interpretation: unremarkable      MDM  Both daughters and patient state normal health.  EKG shows no acute changes.  Discussed drop in hemoglobin of 2 g over the past 2 years.  This can be followed by her primary care physician.  Patient has no complaints of chest pain or dyspnea in the emergency department.  I personally performed the services described in this documentation, which was scribed in my presence. The recorded information has been reviewed and considered.        Donnetta Hutching, MD 09/14/12 1606

## 2012-09-14 NOTE — ED Notes (Signed)
Pt states she had some chest pain under the left breast last evening. States she went to her PCP this morning for a scheduled visit. States her EKG was abnormal and she was told to come here for evaluation

## 2012-11-07 ENCOUNTER — Emergency Department (HOSPITAL_COMMUNITY): Payer: PRIVATE HEALTH INSURANCE

## 2012-11-07 ENCOUNTER — Emergency Department (HOSPITAL_COMMUNITY)
Admission: EM | Admit: 2012-11-07 | Discharge: 2012-11-07 | Disposition: A | Discharge status: Home / Self Care | Payer: PRIVATE HEALTH INSURANCE | Attending: Emergency Medicine | Admitting: Emergency Medicine

## 2012-11-07 ENCOUNTER — Encounter (HOSPITAL_COMMUNITY): Payer: Self-pay

## 2012-11-07 DIAGNOSIS — Z7982 Long term (current) use of aspirin: Secondary | ICD-10-CM | POA: Insufficient documentation

## 2012-11-07 DIAGNOSIS — I2510 Atherosclerotic heart disease of native coronary artery without angina pectoris: Secondary | ICD-10-CM | POA: Insufficient documentation

## 2012-11-07 DIAGNOSIS — Z79899 Other long term (current) drug therapy: Secondary | ICD-10-CM | POA: Insufficient documentation

## 2012-11-07 DIAGNOSIS — E785 Hyperlipidemia, unspecified: Secondary | ICD-10-CM | POA: Insufficient documentation

## 2012-11-07 DIAGNOSIS — R5381 Other malaise: Secondary | ICD-10-CM | POA: Insufficient documentation

## 2012-11-07 DIAGNOSIS — I10 Essential (primary) hypertension: Secondary | ICD-10-CM | POA: Insufficient documentation

## 2012-11-07 DIAGNOSIS — R531 Weakness: Secondary | ICD-10-CM

## 2012-11-07 LAB — URINALYSIS, ROUTINE W REFLEX MICROSCOPIC
Bilirubin Urine: NEGATIVE
Glucose, UA: NEGATIVE mg/dL
Ketones, ur: NEGATIVE mg/dL
Leukocytes, UA: NEGATIVE
pH: 6 (ref 5.0–8.0)

## 2012-11-07 LAB — COMPREHENSIVE METABOLIC PANEL
ALT: 12 U/L (ref 0–35)
AST: 20 U/L (ref 0–37)
Albumin: 4 g/dL (ref 3.5–5.2)
Alkaline Phosphatase: 102 U/L (ref 39–117)
Potassium: 4.5 mEq/L (ref 3.5–5.1)
Sodium: 136 mEq/L (ref 135–145)
Total Protein: 7.8 g/dL (ref 6.0–8.3)

## 2012-11-07 LAB — CBC WITH DIFFERENTIAL/PLATELET
Basophils Relative: 1 % (ref 0–1)
Eosinophils Absolute: 0.1 10*3/uL (ref 0.0–0.7)
MCH: 25.2 pg — ABNORMAL LOW (ref 26.0–34.0)
MCHC: 32.8 g/dL (ref 30.0–36.0)
Neutrophils Relative %: 49 % (ref 43–77)
Platelets: 225 10*3/uL (ref 150–400)
RBC: 3.97 MIL/uL (ref 3.87–5.11)

## 2012-11-07 NOTE — ED Provider Notes (Addendum)
History     CSN: [id number]  Arrival date & time 11/07/12  1726   First MD Initiated Contact with Patient 11/07/12 1753      Chief Complaint  Patient presents with  . Weakness    (Consider location/radiation/quality/duration/timing/severity/associated sxs/prior treatment) Patient is a 78 y.o. female presenting with weakness. The history is provided by the patient (the pt complains of weakness).  Weakness Primary symptoms do not include headaches or seizures. The symptoms began 2 days ago. The symptoms are unchanged. The neurological symptoms are multifocal. Context: nothing.  Additional symptoms include weakness. Additional symptoms do not include hallucinations. Medical issues do not include seizures.    Past Medical History  Diagnosis Date  . Essential hypertension, benign   . Hyperlipidemia   . Coronary atherosclerosis of native coronary artery     Nonobstructive 2009 - previously followed by [facility]  . Peripheral neuropathy   . Prediabetes   . PVC's (premature ventricular contractions)     Past Surgical History  Procedure Date  . Left abdominal hernia repair   . Hernia repair     Family History  Problem Relation Age of Onset  . Hypertension Father   . Kidney disease Father     History  Substance Use Topics  . Smoking status: Never Smoker   . Smokeless tobacco: Never Used  . Alcohol Use: No    OB History    Grav Para Term Preterm Abortions TAB SAB Ect Mult Living                  Review of Systems  Constitutional: Negative for fatigue.  HENT: Negative for congestion, sinus pressure and ear discharge.   Eyes: Negative for discharge.  Respiratory: Negative for cough.   Cardiovascular: Negative for chest pain.  Gastrointestinal: Negative for abdominal pain and diarrhea.  Genitourinary: Negative for frequency and hematuria.  Musculoskeletal: Negative for back pain.  Skin: Negative for rash.  Neurological: Positive for weakness. Negative for seizures and  headaches.  Hematological: Negative.   Psychiatric/Behavioral: Negative for hallucinations.    Allergies  Review of patient's allergies indicates no known allergies.  Home Medications   Current Outpatient Rx  Name  Route  Sig  Dispense  Refill  . ASPIRIN EC 81 MG PO TBEC   Oral   Take 81 mg by mouth daily.         . ATORVASTATIN CALCIUM 40 MG PO TABS   Oral   Take 40 mg by mouth daily.         Marland Kitchen BISACODYL 5 MG PO TBEC   Oral   Take 5 mg by mouth daily as needed. Constipation.         Marland Kitchen CALCIUM + D PO   Oral   Take 1 tablet by mouth daily.          Marland Kitchen EZETIMIBE 10 MG PO TABS   Oral   Take 10 mg by mouth daily.         Marland Kitchen GABAPENTIN 100 MG PO CAPS   Oral   Take 100 mg by mouth daily.         . [employer] LEG CRAMPS PAIN RELIEF PO   Oral   Take 1 tablet by mouth daily as needed. Foot cramps.         Marland Kitchen HYDROCODONE-ACETAMINOPHEN 5-500 MG PO TABS   Oral   Take 1 tablet by mouth every 4 (four) hours as needed. Pain.         Marland Kitchen  LISINOPRIL 20 MG PO TABS   Oral   Take 20 mg by mouth daily.         Marland Kitchen OMEPRAZOLE 20 MG PO CPDR   Oral   Take 20 mg by mouth daily.         Marland Kitchen POTASSIUM 99 MG PO TABS   Oral   Take 1 tablet by mouth daily.          Marland Kitchen DIPHENHYDRAMINE HCL 25 MG PO TABS   Oral   Take 25 mg by mouth every 6 (six) hours as needed. Itching.         Marland Kitchen NITROGLYCERIN 0.4 MG SL SUBL   Sublingual   Place 0.4 mg under the tongue every 5 (five) minutes as needed. Chest pain.           BP 110/69  Pulse 72  Temp 99 F (37.2 C) (Oral)  Resp 20  SpO2 100%  Physical Exam  Constitutional: She is oriented to person, place, and time. She appears well-developed.  HENT:  Head: Normocephalic and atraumatic.  Eyes: Conjunctivae normal and EOM are normal. No scleral icterus.  Neck: Neck supple. No thyromegaly present.  Cardiovascular: Normal rate and regular rhythm.  Exam reveals no gallop and no friction rub.   No murmur heard. Pulmonary/Chest: No  stridor. She has no wheezes. She has no rales. She exhibits no tenderness.  Abdominal: She exhibits no distension. There is no tenderness. There is no rebound.  Musculoskeletal: Normal range of motion. She exhibits no edema.  Lymphadenopathy:    She has no cervical adenopathy.  Neurological: She is oriented to person, place, and time. Coordination normal.  Skin: No rash noted. No erythema.  Psychiatric: She has a normal mood and affect. Her behavior is normal.    ED Course  Procedures (including critical care time)  Labs Reviewed  CBC WITH DIFFERENTIAL - Abnormal; Notable for the following:    Hemoglobin 10.0 (*)     HCT 30.5 (*)     MCV 76.8 (*)     MCH 25.2 (*)     All other components within normal limits  COMPREHENSIVE METABOLIC PANEL - Abnormal; Notable for the following:    Total Bilirubin 0.2 (*)     GFR calc non Af Amer 48 (*)     GFR calc Af Amer 55 (*)     All other components within normal limits  URINALYSIS, ROUTINE W REFLEX MICROSCOPIC - Abnormal; Notable for the following:    Color, Urine STRAW (*)     Specific Gravity, Urine <1.005 (*)     All other components within normal limits   Dg Chest 2 View  11/07/2012  *RADIOLOGY REPORT*  Clinical Data: Weakness and dizziness.  CHEST - 2 VIEW  Comparison: 09/14/2012  Findings: Lungs are clear. The cardiopericardial silhouette is enlarged.  Prominence of the ascending aorta suggest possible aneurysm there appears to be dense calcification in the ascending aorta.  This appearance is stable since the prior study and also back to 07/04/2009.  Calcified granulomas are noted in the left upper lobe. Imaged bony structures of the thorax are intact.  IMPRESSION: Stable exam.  No new or acute interval findings.   Original Report Authenticated By: Kennith Center, M.D.      1. Weakness       MDM      Date: 11/07/2012  Rate: 68  Rhythm: normal sinus rhythm  QRS Axis: normal  Intervals: normal  ST/T Wave abnormalities:  nonspecific ST  changes  Conduction Disutrbances:none  Narrative Interpretation:   Old EKG Reviewed: none available       Benny Lennert, MD 11/07/12 1924  Benny Lennert, MD 11/07/12 [phone]

## 2012-11-07 NOTE — ED Notes (Signed)
Pt reports having "weak spells" that hit all of a sudden she will eat a banana and drink something then feel better. Thinks her potassium may be low.

## 2013-01-01 ENCOUNTER — Telehealth: Payer: Self-pay | Admitting: *Deleted

## 2013-01-01 NOTE — Telephone Encounter (Signed)
Pt daughter came into office today  to drop off surgical clearance form for cataract surgery, pt daughter called back to advise that she is having the surgery this upcoming Monday 01-04-13, pt was advised by scheduler per pt last OV on 5-13, noted pt overdue for OV and MD SM will not be able to clear her for surgery at this time, pt accepted apt for 01-05-13 at 1pm, form placed on file for completion after upcoming apt

## 2013-01-05 ENCOUNTER — Ambulatory Visit (INDEPENDENT_AMBULATORY_CARE_PROVIDER_SITE_OTHER): Payer: PRIVATE HEALTH INSURANCE | Admitting: Cardiology

## 2013-01-05 ENCOUNTER — Encounter: Payer: Self-pay | Admitting: Cardiology

## 2013-01-05 VITALS — BP 107/68 | HR 69 | Ht 65.5 in | Wt 140.0 lb

## 2013-01-05 DIAGNOSIS — I2510 Atherosclerotic heart disease of native coronary artery without angina pectoris: Secondary | ICD-10-CM

## 2013-01-05 DIAGNOSIS — Z01818 Encounter for other preprocedural examination: Secondary | ICD-10-CM

## 2013-01-05 DIAGNOSIS — I10 Essential (primary) hypertension: Secondary | ICD-10-CM

## 2013-01-05 NOTE — Patient Instructions (Addendum)

## 2013-01-05 NOTE — Progress Notes (Signed)
Clinical Summary Evelyn Baird is an 79 y.o.female presenting for followup. She is to have cataract surgery soon. Last visit with [place] was in May 2013, at which time she was relatively stable. History includes mild to moderate nonobstructive CAD by cardiac catheterization in 2009.  She is here with family members today, states that she has been doing well, no angina symptoms, no palpitations or syncope. She reports compliance with her medications.   No Known Allergies  Current Outpatient Prescriptions  Medication Sig Dispense Refill  . aspirin EC 81 MG tablet Take 81 mg by mouth daily.      Marland Kitchen atorvastatin (LIPITOR) 40 MG tablet Take 40 mg by mouth daily.      . Calcium Carbonate-Vitamin D (CALCIUM + D PO) Take 1 tablet by mouth 2 (two) times daily.       . diphenhydrAMINE (BENADRYL) 25 MG tablet Take 25 mg by mouth every 6 (six) hours as needed. Itching.      . ezetimibe (ZETIA) 10 MG tablet Take 10 mg by mouth daily.      Marland Kitchen gabapentin (NEURONTIN) 100 MG capsule Take 100 mg by mouth daily. Will increase to twice daily when pt finishes first bottle of once daily.      . Homeopathic Products ([employer]S LEG CRAMPS PAIN RELIEF PO) Take 1 tablet by mouth daily as needed. Foot cramps.      Marland Kitchen lisinopril (PRINIVIL,ZESTRIL) 20 MG tablet Take 20 mg by mouth daily.      . nitroGLYCERIN (NITROSTAT) 0.4 MG SL tablet Place 0.4 mg under the tongue every 5 (five) minutes as needed. Chest pain.      Marland Kitchen omeprazole (PRILOSEC) 20 MG capsule Take 20 mg by mouth daily.      . Potassium 99 MG TABS Take 1 tablet by mouth daily.        No current facility-administered medications for this visit.    Past Medical History  Diagnosis Date  . Essential hypertension, benign   . Hyperlipidemia   . Coronary atherosclerosis of native coronary artery     Nonobstructive 2009 - previously followed by [facility]  . Peripheral neuropathy   . Prediabetes   . PVC's (premature ventricular contractions)     Social History Evelyn Baird  reports that she has never smoked. She has never used smokeless tobacco. Evelyn Baird reports that she does not drink alcohol.  Review of Systems Decreased vision both eyes. Stable appetite. No bleeding episodes. No orthopnea or PND. Otherwise negative.  Physical Examination Filed Vitals:   01/05/13 1338  BP: 107/68  Pulse: 69   Filed Weights   01/05/13 1338  Weight: 140 lb (63.504 kg)    Normally nourished appearing elderly woman in no acute distress.  HEENT: Conjunctiva and lids normal, oropharynx clear.  Neck: Supple, no elevated JVP or carotid bruits, no thyromegaly.  Lungs: Clear to auscultation, nonlabored breathing at rest.  Cardiac: Regular rate and rhythm, no S3, 2/6 systolic murmur, no pericardial rub.  Abdomen: Soft, nontender, bowel sounds present, no guarding or rebound.  Extremities: No pitting edema, distal pulses 2+.    Problem List and Plan   Preoperative evaluation to rule out surgical contraindication Patient to undergo cataract extraction on 3/6 at [facility]. From a cardiac perspective she has been clinically stable, previous history of nonobstructive CAD. Do not anticipate any further cardiac testing at this time. She should be able to proceed with an acceptable perioperative cardiovascular risk, overall low.  Coronary atherosclerosis of native coronary  artery Mild to moderate, nonobstructive disease. Continue medical therapy and observation.  Essential hypertension, benign Blood pressure normal today. No changes made.    Jonelle Sidle, M.D., F.A.C.C.

## 2013-01-05 NOTE — Assessment & Plan Note (Signed)
Patient to undergo cataract extraction on 3/6 at [facility]. From a cardiac perspective she has been clinically stable, previous history of nonobstructive CAD. Do not anticipate any further cardiac testing at this time. She should be able to proceed with an acceptable perioperative cardiovascular risk, overall low.

## 2013-01-05 NOTE — Assessment & Plan Note (Signed)
Blood pressure normal today. No changes made. 

## 2013-01-05 NOTE — Assessment & Plan Note (Signed)
Mild to moderate, nonobstructive disease. Continue medical therapy and observation.

## 2013-01-06 NOTE — Telephone Encounter (Signed)
Noted pt was evaluated by MD SM at OV 01-05-13 and surgical clearance addressed at OV with pt and family member.

## 2013-11-17 ENCOUNTER — Emergency Department (HOSPITAL_COMMUNITY)
Admission: EM | Admit: 2013-11-17 | Discharge: 2013-11-17 | Disposition: A | Discharge status: Home / Self Care | Payer: PRIVATE HEALTH INSURANCE | Attending: Emergency Medicine | Admitting: Emergency Medicine

## 2013-11-17 ENCOUNTER — Emergency Department (HOSPITAL_COMMUNITY): Payer: PRIVATE HEALTH INSURANCE

## 2013-11-17 ENCOUNTER — Encounter (HOSPITAL_COMMUNITY): Payer: Self-pay | Admitting: Emergency Medicine

## 2013-11-17 DIAGNOSIS — Z79899 Other long term (current) drug therapy: Secondary | ICD-10-CM | POA: Insufficient documentation

## 2013-11-17 DIAGNOSIS — G609 Hereditary and idiopathic neuropathy, unspecified: Secondary | ICD-10-CM | POA: Insufficient documentation

## 2013-11-17 DIAGNOSIS — E785 Hyperlipidemia, unspecified: Secondary | ICD-10-CM | POA: Insufficient documentation

## 2013-11-17 DIAGNOSIS — Z7982 Long term (current) use of aspirin: Secondary | ICD-10-CM | POA: Insufficient documentation

## 2013-11-17 DIAGNOSIS — W1809XA Striking against other object with subsequent fall, initial encounter: Secondary | ICD-10-CM | POA: Insufficient documentation

## 2013-11-17 DIAGNOSIS — S93601A Unspecified sprain of right foot, initial encounter: Secondary | ICD-10-CM

## 2013-11-17 DIAGNOSIS — Y939 Activity, unspecified: Secondary | ICD-10-CM | POA: Insufficient documentation

## 2013-11-17 DIAGNOSIS — I2510 Atherosclerotic heart disease of native coronary artery without angina pectoris: Secondary | ICD-10-CM | POA: Insufficient documentation

## 2013-11-17 DIAGNOSIS — W19XXXA Unspecified fall, initial encounter: Secondary | ICD-10-CM

## 2013-11-17 DIAGNOSIS — S93609A Unspecified sprain of unspecified foot, initial encounter: Secondary | ICD-10-CM | POA: Insufficient documentation

## 2013-11-17 DIAGNOSIS — I10 Essential (primary) hypertension: Secondary | ICD-10-CM | POA: Insufficient documentation

## 2013-11-17 DIAGNOSIS — Y92009 Unspecified place in unspecified non-institutional (private) residence as the place of occurrence of the external cause: Secondary | ICD-10-CM | POA: Insufficient documentation

## 2013-11-17 MED ORDER — OXYCODONE-ACETAMINOPHEN 5-325 MG PO TABS: 1.0000 | ORAL_TABLET | ORAL | Status: DC | PRN

## 2013-11-17 MED ORDER — OXYCODONE-ACETAMINOPHEN 5-325 MG PO TABS
1.0000 | ORAL_TABLET | Freq: Once | ORAL | Status: AC
  Administered 2013-11-17: 1 via ORAL
  Filled 2013-11-17: qty 1

## 2013-11-17 NOTE — Discharge Instructions (Signed)
Use the post op shoe as needed. Use the walker as needed. Take Acetaminophen (Tylenol), Ibuprofen (Advil), or Naproxen (Aleve) as needed for less severe pain.  Foot Sprain The muscles and cord like structures which attach muscle to bone (tendons) that surround the feet are made up of units. A foot sprain can occur at the weakest spot in any of these units. This condition is most often caused by injury to or overuse of the foot, as from playing contact sports, or aggravating a previous injury, or from poor conditioning, or obesity. SYMPTOMS  Pain with movement of the foot.  Tenderness and swelling at the injury site.  Loss of strength is present in moderate or severe sprains. THE THREE GRADES OR SEVERITY OF FOOT SPRAIN ARE:  Mild (Grade I): Slightly pulled muscle without tearing of muscle or tendon fibers or loss of strength.  Moderate (Grade II): Tearing of fibers in a muscle, tendon, or at the attachment to bone, with small decrease in strength.  Severe (Grade III): Rupture of the muscle-tendon-bone attachment, with separation of fibers. Severe sprain requires surgical repair. Often repeating (chronic) sprains are caused by overuse. Sudden (acute) sprains are caused by direct injury or over-use. DIAGNOSIS  Diagnosis of this condition is usually by your own observation. If problems continue, a caregiver may be required for further evaluation and treatment. X-rays may be required to make sure there are not breaks in the bones (fractures) present. Continued problems may require physical therapy for treatment. PREVENTION  Use strength and conditioning exercises appropriate for your sport.  Warm up properly prior to working out.  Use athletic shoes that are made for the sport you are participating in.  Allow adequate time for healing. Early return to activities makes repeat injury more likely, and can lead to an unstable arthritic foot that can result in prolonged disability. Mild sprains  generally heal in 3 to 10 days, with moderate and severe sprains taking 2 to 10 weeks. Your caregiver can help you determine the proper time required for healing. HOME CARE INSTRUCTIONS   Apply ice to the injury for 15-20 minutes, 03-04 times per day. Put the ice in a plastic bag and place a towel between the bag of ice and your skin.  An elastic wrap (like an Ace bandage) may be used to keep swelling down.  Keep foot above the level of the heart, or at least raised on a footstool, when swelling and pain are present.  Try to avoid use other than gentle range of motion while the foot is painful. Do not resume use until instructed by your caregiver. Then begin use gradually, not increasing use to the point of pain. If pain does develop, decrease use and continue the above measures, gradually increasing activities that do not cause discomfort, until you gradually achieve normal use.  Use crutches if and as instructed, and for the length of time instructed.  Keep injured foot and ankle wrapped between treatments.  Massage foot and ankle for comfort and to keep swelling down. Massage from the toes up towards the knee.  Only take over-the-counter or prescription medicines for pain, discomfort, or fever as directed by your caregiver. SEEK IMMEDIATE MEDICAL CARE IF:   Your pain and swelling increase, or pain is not controlled with medications.  You have loss of feeling in your foot or your foot turns cold or blue.  You develop new, unexplained symptoms, or an increase of the symptoms that brought you to your caregiver. MAKE SURE YOU:  Understand these instructions.  Will watch your condition.  Will get help right away if you are not doing well or get worse. Document Released: 04/19/2002 Document Revised: 01/20/2012 Document Reviewed: 06/16/2008 [facility] Patient Information 2014 [place], [place].  Acetaminophen; Oxycodone tablets What is this medicine? ACETAMINOPHEN; OXYCODONE (a set a  MEE noe fen; ox i KOE done) is a pain reliever. It is used to treat mild to moderate pain. This medicine may be used for other purposes; ask your health care provider or pharmacist if you have questions. COMMON BRAND NAME(S): Endocet, Magnacet, Narvox, Percocet, Perloxx, Primalev, Primlev, Roxicet, Xolox What should I tell my health care provider before I take this medicine? They need to know if you have any of these conditions: -brain tumor -Crohn's disease, inflammatory bowel disease, or ulcerative colitis -drink more than 3 alcohol containing drinks per day -drug abuse or addiction -head injury -heart or circulation problems -kidney disease or problems going to the bathroom -liver disease -lung disease, asthma, or breathing problems -an unusual or allergic reaction to acetaminophen, oxycodone, other opioid analgesics, other medicines, foods, dyes, or preservatives -pregnant or trying to get pregnant -breast-feeding How should I use this medicine? Take this medicine by mouth with a full glass of water. Follow the directions on the prescription label. Take your medicine at regular intervals. Do not take your medicine more often than directed. Talk to your pediatrician regarding the use of this medicine in children. Special care may be needed. Patients over 39 years old may have a stronger reaction and need a smaller dose. Overdosage: If you think you have taken too much of this medicine contact a poison control center or emergency room at once. NOTE: This medicine is only for you. Do not share this medicine with others. What if I miss a dose? If you miss a dose, take it as soon as you can. If it is almost time for your next dose, take only that dose. Do not take double or extra doses. What may interact with this medicine? -alcohol -antihistamines -barbiturates like amobarbital, butalbital, butabarbital, methohexital, pentobarbital, phenobarbital, thiopental, and  secobarbital -benztropine -drugs for bladder problems like solifenacin, trospium, oxybutynin, tolterodine, hyoscyamine, and methscopolamine -drugs for breathing problems like ipratropium and tiotropium -drugs for certain stomach or intestine problems like propantheline, homatropine methylbromide, glycopyrrolate, atropine, belladonna, and dicyclomine -general anesthetics like etomidate, ketamine, nitrous oxide, propofol, desflurane, enflurane, halothane, isoflurane, and sevoflurane -medicines for depression, anxiety, or psychotic disturbances -medicines for sleep -muscle relaxants -naltrexone -narcotic medicines (opiates) for pain -phenothiazines like perphenazine, thioridazine, chlorpromazine, mesoridazine, fluphenazine, prochlorperazine, promazine, and trifluoperazine -scopolamine -tramadol -trihexyphenidyl This list may not describe all possible interactions. Give your health care provider a list of all the medicines, herbs, non-prescription drugs, or dietary supplements you use. Also tell them if you smoke, drink alcohol, or use illegal drugs. Some items may interact with your medicine. What should I watch for while using this medicine? Tell your doctor or health care professional if your pain does not go away, if it gets worse, or if you have new or a different type of pain. You may develop tolerance to the medicine. Tolerance means that you will need a higher dose of the medication for pain relief. Tolerance is normal and is expected if you take this medicine for a long time. Do not suddenly stop taking your medicine because you may develop a severe reaction. Your body becomes used to the medicine. This does NOT mean you are addicted. Addiction is a behavior related to getting  and using a drug for a non-medical reason. If you have pain, you have a medical reason to take pain medicine. Your doctor will tell you how much medicine to take. If your doctor wants you to stop the medicine, the dose  will be slowly lowered over time to avoid any side effects. You may get drowsy or dizzy. Do not drive, use machinery, or do anything that needs mental alertness until you know how this medicine affects you. Do not stand or sit up quickly, especially if you are an older patient. This reduces the risk of dizzy or fainting spells. Alcohol may interfere with the effect of this medicine. Avoid alcoholic drinks. There are different types of narcotic medicines (opiates) for pain. If you take more than one type at the same time, you may have more side effects. Give your health care provider a list of all medicines you use. Your doctor will tell you how much medicine to take. Do not take more medicine than directed. Call emergency for help if you have problems breathing. The medicine will cause constipation. Try to have a bowel movement at least every 2 to 3 days. If you do not have a bowel movement for 3 days, call your doctor or health care professional. Do not take Tylenol (acetaminophen) or medicines that have acetaminophen with this medicine. Too much acetaminophen can be very dangerous. Many nonprescription medicines contain acetaminophen. Always read the labels carefully to avoid taking more acetaminophen. What side effects may I notice from receiving this medicine? Side effects that you should report to your doctor or health care professional as soon as possible: -allergic reactions like skin rash, itching or hives, swelling of the face, lips, or tongue -breathing difficulties, wheezing -confusion -light headedness or fainting spells -severe stomach pain -yellowing of the skin or the whites of the eyes Side effects that usually do not require medical attention (report to your doctor or health care professional if they continue or are bothersome): -dizziness -drowsiness -nausea -vomiting This list may not describe all possible side effects. Call your doctor for medical advice about side effects. You  may report side effects to FDA at 1-800-FDA-1088. Where should I keep my medicine? Keep out of the reach of children. This medicine can be abused. Keep your medicine in a safe place to protect it from theft. Do not share this medicine with anyone. Selling or giving away this medicine is dangerous and against the law. Store at room temperature between 20 and 25 degrees C (68 and 77 degrees F). Keep container tightly closed. Protect from light. This medicine may cause accidental overdose and death if it is taken by other adults, children, or pets. Flush any unused medicine down the toilet to reduce the chance of harm. Do not use the medicine after the expiration date. NOTE: This sheet is a summary. It may not cover all possible information. If you have questions about this medicine, talk to your doctor, pharmacist, or health care provider.  2014, [employer]. (2013-04-13 11:35:32)

## 2013-11-17 NOTE — ED Provider Notes (Signed)
CSN: [id number]     Arrival date & time 11/17/13  2136 History  This chart was scribed for Delora Fuel, MD by Rolanda Lundborg, ED Scribe. This patient was seen in room APA09/APA09 and the patient's care was started at 11:07 PM.   Chief Complaint  Patient presents with  . Fall  . Foot Injury   The history is provided by the patient. No language interpreter was used.   HPI Comments: Evelyn Baird is a 80 y.o. female with a h/o HTN who presents to the Emergency Department complaining of pain to the dorsal aspect of right foot after falling forward onto the couch. Pt states her right foot got tangled under a foot stool in front of the couch causing her to fall. Pt denies LOC. She denies head, neck, chest, abdomen, back injury. She uses a cane occasionally. She is normally in good health. She does not smoke or drink. She has a PCP, but ahs had difficulty getting appointments.   Past Medical History  Diagnosis Date  . Essential hypertension, benign   . Hyperlipidemia   . Coronary atherosclerosis of native coronary artery     Nonobstructive 2009 - previously followed by [facility]  . Peripheral neuropathy   . Prediabetes   . PVC's (premature ventricular contractions)    Past Surgical History  Procedure Laterality Date  . Left abdominal hernia repair    . Hernia repair     Family History  Problem Relation Age of Onset  . Hypertension Father   . Kidney disease Father    History  Substance Use Topics  . Smoking status: Never Smoker   . Smokeless tobacco: Never Used  . Alcohol Use: No   OB History   Grav Para Term Preterm Abortions TAB SAB Ect Mult Living                 Review of Systems  Musculoskeletal: Positive for arthralgias (right foot).  All other systems reviewed and are negative.    Allergies  Review of patient's allergies indicates no known allergies.  Home Medications   Current Outpatient Rx  Name  Route  Sig  Dispense  Refill  . aspirin EC 81 MG tablet   Oral  Take 81 mg by mouth every morning.          Marland Kitchen atorvastatin (LIPITOR) 40 MG tablet   Oral   Take 40 mg by mouth every morning.          . Calcium Carb-Cholecalciferol (CALCIUM 600 + D) 600-200 MG-UNIT TABS   Oral   Take 1 tablet by mouth 2 (two) times daily.         . Calcium Carbonate-Vitamin D (CALCIUM + D PO)   Oral   Take 1 tablet by mouth 2 (two) times daily.          . diphenhydrAMINE (BENADRYL) 25 MG tablet   Oral   Take 25 mg by mouth every 6 (six) hours as needed. Itching.         . gabapentin (NEURONTIN) 100 MG capsule   Oral   Take 100 mg by mouth daily. Will increase to twice daily when pt finishes first bottle of once daily.         . Homeopathic Products ([employer] LEG CRAMPS PAIN RELIEF PO)   Oral   Take 1 tablet by mouth daily as needed. Foot cramps.         Marland Kitchen lisinopril (PRINIVIL,ZESTRIL) 20 MG tablet   Oral  Take 20 mg by mouth every morning.          . nitroGLYCERIN (NITROSTAT) 0.4 MG SL tablet   Sublingual   Place 0.4 mg under the tongue every 5 (five) minutes as needed. Chest pain.          BP 136/72  Pulse 80  Temp(Src) 98.3 F (36.8 C) (Oral)  Resp 18  Ht 5\' 6"  (1.676 m)  Wt 143 lb (64.864 kg)  BMI 23.09 kg/m2  SpO2 99% Physical Exam  Nursing note and vitals reviewed. Constitutional: She is oriented to person, place, and time. She appears well-developed and well-nourished. No distress.  HENT:  Head: Normocephalic and atraumatic.  Eyes: EOM are normal. Pupils are equal, round, and reactive to light.  Neck: Neck supple. No JVD present. No tracheal deviation present.  Cardiovascular: Normal rate and regular rhythm.   No murmur heard. Pulmonary/Chest: Effort normal and breath sounds normal. No respiratory distress. She has no wheezes. She has no rales.  Abdominal: Soft. Bowel sounds are normal. She exhibits no mass. There is no tenderness.  Musculoskeletal: Normal range of motion. She exhibits no edema.  Right foot has tenderness  across the dorsum of the mid foot. Pain elicited with passive plantar flexion and also with dorsi flexion against resistance. Neurovascular exam is normal with normal sensation and prompt capillary refill  Lymphadenopathy:    She has no cervical adenopathy.  Neurological: She is alert and oriented to person, place, and time. No cranial nerve deficit.  Skin: Skin is warm and dry.  Psychiatric: She has a normal mood and affect. Her behavior is normal.    ED Course  Procedures (including critical care time) Medications - No data to display  DIAGNOSTIC STUDIES: Oxygen Saturation is 99% on RA, normal by my interpretation.    COORDINATION OF CARE: 11:11 PM- Discussed treatment plan with pt which includes prescription for a walker, pain medication, and referral to an orthopedist. Pt agrees to plan.  Imaging Review Dg Foot Complete Right  11/17/2013   CLINICAL DATA:  Pain post trauma  EXAM: RIGHT FOOT COMPLETE - 3+ VIEW  COMPARISON:  None.  FINDINGS: Frontal, oblique, and lateral views were obtained. There is no acute fracture or dislocation. There is osteoarthritic change in all PIP and DIP joints. There is no erosive change. There is a small spur arising from the posterior calcaneus. There is pes planus.  IMPRESSION: Multifocal osteoarthritic change. Pes planus. No acute fracture or dislocation.   Electronically Signed   By: Lowella Grip M.D.   On: 11/17/2013 22:17   MDM   1. Fall at home, initial encounter   2. Sprain of right foot, initial encounter    Fall with injury to the right foot most consistent with extensor tendinitis. X-rays are negative for fracture. She's given a prescription for a walker to use as needed and oxycodone acetaminophen use for pain. She sees acetaminophen, naproxen, or ibuprofen as needed for less severe pain. She is referred to orthopedics for followup.  I personally performed the services described in this documentation, which was scribed in my presence. The  recorded information has been reviewed and is accurate.   Delora Fuel, MD 63/01/60 1093

## 2013-11-17 NOTE — ED Notes (Signed)
Pt reports getting right foot caught under foot stool and then falling forward onto the couch. Denies LOC. C/o pain to arch of right foot. Peripheral sensation and pulses WDL.

## 2013-11-17 NOTE — ED Notes (Signed)
Pt states she got her right foot tangled in a footstool and fell, pain to foot.

## 2013-11-18 ENCOUNTER — Telehealth: Payer: Self-pay | Admitting: Orthopedic Surgery

## 2013-11-18 NOTE — Telephone Encounter (Addendum)
Patient/daughter Evelyn Baird, called to relay that her mom was seen at [facility] for problem of right foot tendonitis.  States that her insurance, Medicare, primary, does not require referral, and that her Medicaid([employer]) does require referral.  Patient has contacted primary care, [facility], to request referral.  (Our office has sent a faxed note as well).  Appointment pending.  Patient aware of status.  Patient's daughter's ph# [phone].

## 2013-11-23 MED FILL — Oxycodone w/ Acetaminophen Tab 5-325 MG: ORAL | Qty: 6 | Status: AC

## 2013-11-23 NOTE — Telephone Encounter (Signed)
11/23/13 - Patient's daughter called to check status - advised that no response to our faxed request, nor patient's request, has been received from primary care.  Referral needed to schedule appointment.

## 2013-12-02 NOTE — Telephone Encounter (Signed)
11/25/13 followed up with Primary care office due to no response; spoke with Anastasio Champion, who is reviewing with Dr. Lennox Grumbles  12/01/13 call received from Primary care; received authorization referral; appointment scheduled. Patient aware.

## 2013-12-06 ENCOUNTER — Encounter: Payer: Self-pay | Admitting: Orthopedic Surgery

## 2013-12-06 ENCOUNTER — Telehealth: Payer: Self-pay | Admitting: *Deleted

## 2013-12-06 ENCOUNTER — Ambulatory Visit (INDEPENDENT_AMBULATORY_CARE_PROVIDER_SITE_OTHER): Payer: PRIVATE HEALTH INSURANCE | Admitting: Orthopedic Surgery

## 2013-12-06 ENCOUNTER — Other Ambulatory Visit: Payer: Self-pay | Admitting: *Deleted

## 2013-12-06 VITALS — BP 133/75 | Ht 66.0 in | Wt 143.0 lb

## 2013-12-06 DIAGNOSIS — G609 Hereditary and idiopathic neuropathy, unspecified: Secondary | ICD-10-CM

## 2013-12-06 NOTE — Patient Instructions (Addendum)
Peripheral Neuropathy/referral to DR [facility]

## 2013-12-06 NOTE — Progress Notes (Signed)
   Subjective:    Patient ID: Evelyn Baird, female    DOB: [birth year], 80 y.o.   MRN: [id number]  HPI Comments: 80 year-old female fell and injured her right foot no fracture was seen she has degenerative changes in the midfoot. However she's had chronic peripheral neuropathy-like symptoms with burning and tingling right greater than left foot and a questionable history of diabetes she's been on some gabapentin 100 mg I believe twice a day. She comes in complaining of burning pain in her right foot worse at night associated with numbness and tingling in the right greater than the left foot. Pain levels are 3/10.  Foot Pain      Review of Systems The patient could not really fill out her forms her daughter tried to help her and they listed a negative review of systems    Objective:   Physical Exam  BP 133/75  Ht 5\' 6"  (1.676 m)  Wt 143 lb (64.864 kg)  BMI 23.09 kg/m2 The patient is awake alert and oriented to person and place she is a pleasant mood she ambulates without labored gait  Other than some corns and calluses the foot is plantigrade with slight pes planus normal range of motion at the ankle no instability normal muscle tone without atrophy skin is normal sharp and soft touch are intact by pinprick and cotton ball test and she is a good distal pulse  Her x-rays show degenerative changes in the midfoot without fracture      Assessment & Plan:   Encounter Diagnosis  Name Primary?  Marland Kitchen Unspecified hereditary and idiopathic peripheral neuropathy Yes    Recommend neurology referral.

## 2013-12-06 NOTE — Telephone Encounter (Signed)
Faxed referral and office notes to Dr. [name]. Awaiting appointment.

## 2013-12-15 NOTE — Telephone Encounter (Signed)
Received fax from Dr. Freddie Apley office that the referral must come from [facility], and the patient is aware.

## 2014-03-23 ENCOUNTER — Observation Stay (HOSPITAL_COMMUNITY)
Admission: EM | Admit: 2014-03-23 | Discharge: 2014-03-25 | Disposition: A | Discharge status: Home / Self Care | Payer: PRIVATE HEALTH INSURANCE | Attending: Internal Medicine | Admitting: Internal Medicine

## 2014-03-23 ENCOUNTER — Emergency Department (HOSPITAL_COMMUNITY): Payer: PRIVATE HEALTH INSURANCE

## 2014-03-23 ENCOUNTER — Encounter (HOSPITAL_COMMUNITY): Payer: Self-pay | Admitting: Emergency Medicine

## 2014-03-23 DIAGNOSIS — E7800 Pure hypercholesterolemia, unspecified: Secondary | ICD-10-CM | POA: Insufficient documentation

## 2014-03-23 DIAGNOSIS — Z7982 Long term (current) use of aspirin: Secondary | ICD-10-CM | POA: Insufficient documentation

## 2014-03-23 DIAGNOSIS — E785 Hyperlipidemia, unspecified: Secondary | ICD-10-CM

## 2014-03-23 DIAGNOSIS — I4949 Other premature depolarization: Secondary | ICD-10-CM | POA: Insufficient documentation

## 2014-03-23 DIAGNOSIS — I359 Nonrheumatic aortic valve disorder, unspecified: Secondary | ICD-10-CM

## 2014-03-23 DIAGNOSIS — G609 Hereditary and idiopathic neuropathy, unspecified: Secondary | ICD-10-CM | POA: Insufficient documentation

## 2014-03-23 DIAGNOSIS — I2510 Atherosclerotic heart disease of native coronary artery without angina pectoris: Secondary | ICD-10-CM

## 2014-03-23 DIAGNOSIS — I519 Heart disease, unspecified: Secondary | ICD-10-CM | POA: Insufficient documentation

## 2014-03-23 DIAGNOSIS — R7309 Other abnormal glucose: Secondary | ICD-10-CM | POA: Insufficient documentation

## 2014-03-23 DIAGNOSIS — I4510 Unspecified right bundle-branch block: Secondary | ICD-10-CM | POA: Insufficient documentation

## 2014-03-23 DIAGNOSIS — R079 Chest pain, unspecified: Principal | ICD-10-CM

## 2014-03-23 DIAGNOSIS — D649 Anemia, unspecified: Secondary | ICD-10-CM

## 2014-03-23 DIAGNOSIS — I10 Essential (primary) hypertension: Secondary | ICD-10-CM

## 2014-03-23 DIAGNOSIS — M549 Dorsalgia, unspecified: Secondary | ICD-10-CM | POA: Insufficient documentation

## 2014-03-23 DIAGNOSIS — D509 Iron deficiency anemia, unspecified: Secondary | ICD-10-CM | POA: Insufficient documentation

## 2014-03-23 LAB — COMPREHENSIVE METABOLIC PANEL
ALT: 11 U/L (ref 0–35)
AST: 20 U/L (ref 0–37)
Albumin: 3.4 g/dL — ABNORMAL LOW (ref 3.5–5.2)
Alkaline Phosphatase: 107 U/L (ref 39–117)
BUN: 20 mg/dL (ref 6–23)
CALCIUM: 9.2 mg/dL (ref 8.4–10.5)
CHLORIDE: 106 meq/L (ref 96–112)
CO2: 26 meq/L (ref 19–32)
Creatinine, Ser: 0.88 mg/dL (ref 0.50–1.10)
GFR calc non Af Amer: 60 mL/min — ABNORMAL LOW (ref >90)
GFR, EST AFRICAN AMERICAN: 69 mL/min — AB (ref >90)
GLUCOSE: 108 mg/dL — AB (ref 70–99)
Potassium: 4.1 mEq/L (ref 3.7–5.3)
SODIUM: 142 meq/L (ref 137–147)
Total Protein: 6.8 g/dL (ref 6.0–8.3)

## 2014-03-23 LAB — URINALYSIS, ROUTINE W REFLEX MICROSCOPIC
BILIRUBIN URINE: NEGATIVE
Glucose, UA: NEGATIVE mg/dL
Hgb urine dipstick: NEGATIVE
Ketones, ur: NEGATIVE mg/dL
Nitrite: NEGATIVE
Protein, ur: NEGATIVE mg/dL
Specific Gravity, Urine: 1.025 (ref 1.005–1.030)
UROBILINOGEN UA: 0.2 mg/dL (ref 0.0–1.0)
pH: 5.5 (ref 5.0–8.0)

## 2014-03-23 LAB — CBC WITH DIFFERENTIAL/PLATELET
BASOS ABS: 0 10*3/uL (ref 0.0–0.1)
Basophils Relative: 1 % (ref 0–1)
EOS ABS: 0.1 10*3/uL (ref 0.0–0.7)
EOS PCT: 2 % (ref 0–5)
HCT: 27.6 % — ABNORMAL LOW (ref 36.0–46.0)
Hemoglobin: 9 g/dL — ABNORMAL LOW (ref 12.0–15.0)
LYMPHS ABS: 1.8 10*3/uL (ref 0.7–4.0)
LYMPHS PCT: 41 % (ref 12–46)
MCH: 24.5 pg — AB (ref 26.0–34.0)
MCHC: 32.6 g/dL (ref 30.0–36.0)
MCV: 75 fL — AB (ref 78.0–100.0)
Monocytes Absolute: 0.3 10*3/uL (ref 0.1–1.0)
Monocytes Relative: 7 % (ref 3–12)
NEUTROS PCT: 51 % (ref 43–77)
Neutro Abs: 2.2 10*3/uL (ref 1.7–7.7)
PLATELETS: 197 10*3/uL (ref 150–400)
RBC: 3.68 MIL/uL — AB (ref 3.87–5.11)
RDW: 17 % — AB (ref 11.5–15.5)
WBC: 4.3 10*3/uL (ref 4.0–10.5)

## 2014-03-23 LAB — URINE MICROSCOPIC-ADD ON

## 2014-03-23 LAB — RETICULOCYTES
RBC.: 3.68 MIL/uL — AB (ref 3.87–5.11)
Retic Count, Absolute: 36.8 10*3/uL (ref 19.0–186.0)
Retic Ct Pct: 1 % (ref 0.4–3.1)

## 2014-03-23 LAB — TROPONIN I: Troponin I: <0.3 ng/mL (ref <0.30)

## 2014-03-23 LAB — PROTIME-INR
INR: 0.93 (ref 0.00–1.49)
Prothrombin Time: 12.3 seconds (ref 11.6–15.2)

## 2014-03-23 LAB — POC OCCULT BLOOD, ED: Fecal Occult Bld: NEGATIVE

## 2014-03-23 MED ORDER — HEPARIN SODIUM (PORCINE) 5000 UNIT/ML IJ SOLN
5000.0000 [IU] | Freq: Three times a day (TID) | INTRAMUSCULAR | Status: DC
  Administered 2014-03-24 – 2014-03-25 (×6): 5000 [IU] via SUBCUTANEOUS
  Filled 2014-03-23 (×6): qty 1

## 2014-03-23 MED ORDER — GABAPENTIN 300 MG PO CAPS
300.0000 mg | ORAL_CAPSULE | Freq: Two times a day (BID) | ORAL | Status: DC
  Administered 2014-03-24 – 2014-03-25 (×4): 300 mg via ORAL
  Filled 2014-03-23 (×8): qty 1

## 2014-03-23 MED ORDER — SODIUM CHLORIDE 0.9 % IV SOLN
INTRAVENOUS | Status: AC
  Administered 2014-03-24 – 2014-03-25 (×2): via INTRAVENOUS

## 2014-03-23 MED ORDER — CALCIUM CARBONATE-VITAMIN D 500-200 MG-UNIT PO TABS
1.0000 | ORAL_TABLET | Freq: Two times a day (BID) | ORAL | Status: DC
  Administered 2014-03-24 – 2014-03-25 (×4): 1 via ORAL
  Filled 2014-03-23 (×8): qty 1

## 2014-03-23 MED ORDER — LISINOPRIL 10 MG PO TABS
20.0000 mg | ORAL_TABLET | Freq: Every morning | ORAL | Status: DC
  Administered 2014-03-24 – 2014-03-25 (×2): 20 mg via ORAL
  Filled 2014-03-23 (×2): qty 2

## 2014-03-23 MED ORDER — CALCIUM CARB-CHOLECALCIFEROL 600-200 MG-UNIT PO TABS: 1.0000 | ORAL_TABLET | Freq: Two times a day (BID) | ORAL | Status: DC

## 2014-03-23 MED ORDER — SODIUM CHLORIDE 0.9 % IJ SOLN
3.0000 mL | Freq: Two times a day (BID) | INTRAMUSCULAR | Status: DC
  Administered 2014-03-24 – 2014-03-25 (×2): 3 mL via INTRAVENOUS

## 2014-03-23 MED ORDER — ASPIRIN EC 81 MG PO TBEC
81.0000 mg | DELAYED_RELEASE_TABLET | Freq: Every morning | ORAL | Status: DC
  Administered 2014-03-24 – 2014-03-25 (×2): 81 mg via ORAL
  Filled 2014-03-23 (×4): qty 1

## 2014-03-23 MED ORDER — NITROGLYCERIN 0.4 MG SL SUBL: 0.4000 mg | SUBLINGUAL_TABLET | SUBLINGUAL | Status: DC | PRN

## 2014-03-23 MED ORDER — DIPHENHYDRAMINE HCL 25 MG PO CAPS
25.0000 mg | ORAL_CAPSULE | Freq: Four times a day (QID) | ORAL | Status: DC | PRN
  Filled 2014-03-23: qty 1

## 2014-03-23 MED ORDER — ONDANSETRON HCL 4 MG/2ML IJ SOLN
4.0000 mg | Freq: Four times a day (QID) | INTRAMUSCULAR | Status: DC | PRN
  Administered 2014-03-24: 4 mg via INTRAVENOUS
  Filled 2014-03-23: qty 2

## 2014-03-23 MED ORDER — ONDANSETRON HCL 4 MG PO TABS: 4.0000 mg | ORAL_TABLET | Freq: Four times a day (QID) | ORAL | Status: DC | PRN

## 2014-03-23 MED ORDER — MORPHINE SULFATE 2 MG/ML IJ SOLN: 2.0000 mg | INTRAMUSCULAR | Status: DC | PRN

## 2014-03-23 NOTE — ED Notes (Signed)
Pt. C/o central chest pain starting at 1800. Pt. Reports taking 2 nitro at home with some relief. EMS gave her 3 asprin en route. Pt. Denies pain at this time.

## 2014-03-23 NOTE — H&P (Signed)
[facility] History and Physical  DEONI COSEY KZS:[id number] DOB: Mar 25, 1930 DOA: 03/23/2014  Referring physician: ER. PCP: Fredirick Maudlin, MD   Chief Complaint: Chest pain.  HPI: Evelyn Baird is a 80 y.o. female  This is an 80 year old lady, who has a history of nonobstructive coronary artery disease, who presents with a couple of episodes of chest pain at rest which have been relieved with sublingual nitroglycerin. She first got the chest pain around 5 PM today at rest. It did not seem to radiate much. There is no dyspnea or nausea or vomiting associated with it. She came to the emergency room and sublingual nitroglycerin seems to have helped her. She subsequently got another chest pain which similarly was relieved. She is currently chest pain-free. She is now being admitted for further management.   Review of Systems:  Constitutional:  No weight loss, night sweats, Fevers, chills, fatigue.  HEENT:  No headaches, Difficulty swallowing,Tooth/dental problems,Sore throat,  No sneezing, itching, ear ache, nasal congestion, post nasal drip,  Cardio-vascular:  No  Orthopnea, PND, swelling in lower extremities, anasarca, dizziness, palpitations  GI:  No heartburn, indigestion, abdominal pain, nausea, vomiting, diarrhea, change in bowel habits, loss of appetite  Resp:  No shortness of breath with exertion or at rest. No excess mucus, no productive cough, No non-productive cough, No coughing up of blood.No change in color of mucus.No wheezing.No chest wall deformity  Skin:  no rash or lesions.  GU:  no dysuria, change in color of urine, no urgency or frequency. No flank pain.  Musculoskeletal:  No joint pain or swelling. No decreased range of motion. No back pain.  Psych:  No change in mood or affect. No depression or anxiety. No memory loss.   Past Medical History  Diagnosis Date  . Essential hypertension, benign   . Hyperlipidemia   . Coronary atherosclerosis of native  coronary artery     Nonobstructive 2009 - previously followed by [facility]  . Peripheral neuropathy   . Prediabetes   . PVC's (premature ventricular contractions)    Past Surgical History  Procedure Laterality Date  . Left abdominal hernia repair    . Hernia repair     Social History:  reports that she has never smoked. She has never used smokeless tobacco. She reports that she does not drink alcohol or use illicit drugs.  No Known Allergies  Family History  Problem Relation Age of Onset  . Hypertension Father   . Kidney disease Father      Prior to Admission medications   Medication Sig Start Date End Date Taking? Authorizing Provider  aspirin EC 81 MG tablet Take 81 mg by mouth every morning.    Yes Historical Provider, MD  Calcium Carb-Cholecalciferol (CALCIUM 600 + D) 600-200 MG-UNIT TABS Take 1 tablet by mouth 2 (two) times daily.   Yes Historical Provider, MD  diphenhydrAMINE (BENADRYL) 25 MG tablet Take 25 mg by mouth every 6 (six) hours as needed. Itching.   Yes Historical Provider, MD  gabapentin (NEURONTIN) 300 MG capsule Take 300 mg by mouth 2 (two) times daily.   Yes Historical Provider, MD  Homeopathic Products ([employer] LEG CRAMPS PAIN RELIEF PO) Take 1 tablet by mouth daily as needed. Foot cramps.   Yes Historical Provider, MD  lisinopril (PRINIVIL,ZESTRIL) 20 MG tablet Take 20 mg by mouth every morning.    Yes Historical Provider, MD  nitroGLYCERIN (NITROSTAT) 0.4 MG SL tablet Place 0.4 mg under the tongue every 5 (five) minutes  as needed. Chest pain.   Yes Historical Provider, MD  Potassium 99 MG TABS Take 1 tablet by mouth daily.   Yes Historical Provider, MD   Physical Exam: Filed Vitals:   03/23/14 2200  BP: 142/74  Pulse: 63  Temp:   Resp: 14    BP 142/74  Pulse 63  Temp(Src) 98.1 F (36.7 C) (Oral)  Resp 14  SpO2 98%  General:  Appears calm and comfortable. Does not appear to be in pain. Eyes: PERRL, normal lids, irises & conjunctiva ENT: grossly normal  hearing, lips & tongue Neck: no LAD, masses or thyromegaly Cardiovascular: RRR, no m/r/g. No LE edema. Telemetry: SR, no arrhythmias  Respiratory: CTA bilaterally, no w/r/r. Normal respiratory effort. Abdomen: soft, ntnd Skin: no rash or induration seen on limited exam Musculoskeletal: grossly normal tone BUE/BLE Psychiatric: grossly normal mood and affect, speech fluent and appropriate Neurologic: grossly non-focal.          Labs on Admission:  Basic Metabolic Panel:  Recent Labs Lab 03/23/14 2048  NA 142  K 4.1  CL 106  CO2 26  GLUCOSE 108*  BUN 20  CREATININE 0.88  CALCIUM 9.2   Liver Function Tests:  Recent Labs Lab 03/23/14 2048  AST 20  ALT 11  ALKPHOS 107  BILITOT <0.2*  PROT 6.8  ALBUMIN 3.4*     CBC:  Recent Labs Lab 03/23/14 2048  WBC 4.3  NEUTROABS 2.2  HGB 9.0*  HCT 27.6*  MCV 75.0*  PLT 197   Cardiac Enzymes:  Recent Labs Lab 03/23/14 2048  TROPONINI <0.30      Radiological Exams on Admission: Dg Chest 2 View  03/23/2014   CLINICAL DATA:  Chest pain, hypertension  EXAM: CHEST  2 VIEW  COMPARISON:  11/07/2012  FINDINGS: Stable cardiomegaly without CHF or pneumonia. Calcific atherosclerosis of the ectatic and tortuous thoracic aorta. Remote granulomatous disease in the upper lobes bilaterally. There is no superimposed consolidation, collapse, effusion, or pneumothorax. Trachea midline. Degenerative changes of the spine. Overall stable exam.  IMPRESSION: Cardiomegaly without acute process  Thoracic atherosclerosis and tortuosity  Remote granulomatous disease  No interval change or acute process   Electronically Signed   By: Daryll Brod M.D.   On: 03/23/2014 21:11    EKG: Independently reviewed. Normal sinus rhythm without any acute ST-T wave changes.  Assessment/Plan   1. Probable cardiac chest pain. Currently chest pain-free. History of nonobstructive coronary artery disease. 2. Hypertension, controlled. 3. Microcytic  anemia. 4. Hyperlipidemia.  Plan: 1. Admit to telemetry floor. 2. Serial cardiac enzymes. 3. Cardiology consultation. 4. Further investigate her microcytic anemia with an iron panel. Fecal occult blood.  Further recommendations will depend on patient's hospital progress.  Code Status: Full code.  Family Communication: I discussed the plan with the patient at the bedside.   Disposition Plan: Home when medically stable.   Time spent: 60 minutes.  [facility] Pager [phone].

## 2014-03-23 NOTE — ED Provider Notes (Signed)
CSN: [id number]     Arrival date & time 03/23/14  1934 History   First MD Initiated Contact with Patient 03/23/14 1941     Chief Complaint  Patient presents with  . Chest Pain     (Consider location/radiation/quality/duration/timing/severity/associated sxs/prior Treatment) HPI Comments: Patient presents for EMS with central chest pain onset around 6 PM while she was eating supper. This pain in the left side and did not radiate. No shortness of breath, nausea or vomiting. She took 2 nitroglycerin at home with some relief. EMS gave her aspirin and initial nitroglycerin the pain is resolved at this time. She believes it lasted about 20 minutes. She reports having this pain last 2 weeks ago. She denies having a heart attack. She is not sure she has a stent in her heart. She denies any leg pain or leg swelling. No abdominal pain. No cough or shortness of breath.  The history is provided by the patient.    Past Medical History  Diagnosis Date  . Essential hypertension, benign   . Hyperlipidemia   . Coronary atherosclerosis of native coronary artery     Nonobstructive 2009 - previously followed by [facility]  . Peripheral neuropathy   . Prediabetes   . PVC's (premature ventricular contractions)    Past Surgical History  Procedure Laterality Date  . Left abdominal hernia repair    . Hernia repair     Family History  Problem Relation Age of Onset  . Hypertension Father   . Kidney disease Father    History  Substance Use Topics  . Smoking status: Never Smoker   . Smokeless tobacco: Never Used  . Alcohol Use: No   OB History   Grav Para Term Preterm Abortions TAB SAB Ect Mult Living                 Review of Systems  Constitutional: Negative for fever, activity change and appetite change.  HENT: Negative for congestion and rhinorrhea.   Respiratory: Positive for chest tightness. Negative for cough and shortness of breath.   Cardiovascular: Positive for chest pain.  Gastrointestinal:  Negative for nausea, vomiting and abdominal pain.  Genitourinary: Negative for dysuria, hematuria, vaginal bleeding and vaginal discharge.  Musculoskeletal: Negative for arthralgias, back pain and myalgias.  Skin: Negative for rash.  Neurological: Negative for dizziness, weakness and headaches.  A complete 10 system review of systems was obtained and all systems are negative except as noted in the HPI and PMH.      Allergies  Review of patient's allergies indicates no known allergies.  Home Medications   Prior to Admission medications   Medication Sig Start Date End Date Taking? Authorizing Provider  aspirin EC 81 MG tablet Take 81 mg by mouth every morning.     Historical Provider, MD  atorvastatin (LIPITOR) 40 MG tablet Take 40 mg by mouth every morning.     Historical Provider, MD  Calcium Carb-Cholecalciferol (CALCIUM 600 + D) 600-200 MG-UNIT TABS Take 1 tablet by mouth 2 (two) times daily.    Historical Provider, MD  Calcium Carbonate-Vitamin D (CALCIUM + D PO) Take 1 tablet by mouth 2 (two) times daily.     Historical Provider, MD  diphenhydrAMINE (BENADRYL) 25 MG tablet Take 25 mg by mouth every 6 (six) hours as needed. Itching.    Historical Provider, MD  gabapentin (NEURONTIN) 100 MG capsule Take 100 mg by mouth daily. Will increase to twice daily when pt finishes first bottle of once daily.  Historical Provider, MD  Homeopathic Products ([employer] LEG CRAMPS PAIN RELIEF PO) Take 1 tablet by mouth daily as needed. Foot cramps.    Historical Provider, MD  lisinopril (PRINIVIL,ZESTRIL) 20 MG tablet Take 20 mg by mouth every morning.     Historical Provider, MD  nitroGLYCERIN (NITROSTAT) 0.4 MG SL tablet Place 0.4 mg under the tongue every 5 (five) minutes as needed. Chest pain.    Historical Provider, MD  oxyCODONE-acetaminophen (PERCOCET/ROXICET) 5-325 MG per tablet Take 1 tablet by mouth every 4 (four) hours as needed. 6/0/63   Delora Fuel, MD  oxyCODONE-acetaminophen  (PERCOCET/ROXICET) 5-325 MG per tablet Take 1 tablet by mouth every 4 (four) hours as needed for severe pain. 0/1/60   Delora Fuel, MD   BP 109/32  Pulse 63  Temp(Src) 98.1 F (36.7 C) (Oral)  Resp 14  SpO2 98% Physical Exam  Constitutional: She is oriented to person, place, and time. She appears well-developed and well-nourished. No distress.  HENT:  Head: Normocephalic and atraumatic.  Mouth/Throat: Oropharynx is clear and moist. No oropharyngeal exudate.  Eyes: Conjunctivae and EOM are normal. Pupils are equal, round, and reactive to light.  Neck: Normal range of motion. Neck supple.  Cardiovascular: Normal rate, regular rhythm, normal heart sounds and intact distal pulses.   No murmur heard. Pulmonary/Chest: Effort normal and breath sounds normal. No respiratory distress. She exhibits no tenderness.  Abdominal: Soft. There is no tenderness. There is no rebound and no guarding.  Musculoskeletal: Normal range of motion. She exhibits no edema and no tenderness.  Neurological: She is alert and oriented to person, place, and time. No cranial nerve deficit. She exhibits normal muscle tone. Coordination normal.  Skin: Skin is warm.    ED Course  Procedures (including critical care time) Labs Review Labs Reviewed  CBC WITH DIFFERENTIAL - Abnormal; Notable for the following:    RBC 3.68 (*)    Hemoglobin 9.0 (*)    HCT 27.6 (*)    MCV 75.0 (*)    MCH 24.5 (*)    RDW 17.0 (*)    All other components within normal limits  COMPREHENSIVE METABOLIC PANEL - Abnormal; Notable for the following:    Glucose, Bld 108 (*)    Albumin 3.4 (*)    Total Bilirubin <0.2 (*)    GFR calc non Af Amer 60 (*)    GFR calc Af Amer 69 (*)    All other components within normal limits  URINALYSIS, ROUTINE W REFLEX MICROSCOPIC - Abnormal; Notable for the following:    Leukocytes, UA TRACE (*)    All other components within normal limits  RETICULOCYTES - Abnormal; Notable for the following:    RBC. 3.68  (*)    All other components within normal limits  TROPONIN I  PROTIME-INR  URINE MICROSCOPIC-ADD ON  COMPREHENSIVE METABOLIC PANEL  CBC  TSH  TROPONIN I  TROPONIN I  TROPONIN I  VITAMIN B12  IRON AND TIBC  FERRITIN  FOLATE  POC OCCULT BLOOD, ED    Imaging Review Dg Chest 2 View  03/23/2014   CLINICAL DATA:  Chest pain, hypertension  EXAM: CHEST  2 VIEW  COMPARISON:  11/07/2012  FINDINGS: Stable cardiomegaly without CHF or pneumonia. Calcific atherosclerosis of the ectatic and tortuous thoracic aorta. Remote granulomatous disease in the upper lobes bilaterally. There is no superimposed consolidation, collapse, effusion, or pneumothorax. Trachea midline. Degenerative changes of the spine. Overall stable exam.  IMPRESSION: Cardiomegaly without acute process  Thoracic atherosclerosis and tortuosity  Remote granulomatous disease  No interval change or acute process   Electronically Signed   By: Daryll Brod M.D.   On: 03/23/2014 21:11     EKG Interpretation   Date/Time:  Wednesday Mar 23 2014 19:45:43 EDT Ventricular Rate:  76 PR Interval:  206 QRS Duration: 108 QT Interval:  405 QTC Calculation: 455 R Axis:   -34 Text Interpretation:  Sinus rhythm Incomplete RBBB and LAFB No significant  change was found Confirmed by Wyvonnia Dusky  MD, Burney Cal[place]illa [phone]) on 03/23/2014  8:02:20 PM      MDM   Final diagnoses:  Chest pain   Central chest pain episode resolved with nitroglycerin and aspirin. Pain-free on arrival. EKG shows right bundle branch block nonspecific ST changes.  Record review shows catheterization in 2009 nonobstructive disease.  EKG without ST changes. Troponin negative. No further chest pain in ED. Slight decrease in hemoglobin to 9 from 10-11 range. FOBT negative.  Observation admission for chest pain rule out d/ wDr. Anastasio Champion.   Ezequiel Essex, MD 03/23/14 [phone]

## 2014-03-24 DIAGNOSIS — I2510 Atherosclerotic heart disease of native coronary artery without angina pectoris: Secondary | ICD-10-CM

## 2014-03-24 DIAGNOSIS — R079 Chest pain, unspecified: Principal | ICD-10-CM

## 2014-03-24 DIAGNOSIS — I369 Nonrheumatic tricuspid valve disorder, unspecified: Secondary | ICD-10-CM

## 2014-03-24 LAB — COMPREHENSIVE METABOLIC PANEL
ALK PHOS: 89 U/L (ref 39–117)
ALT: 8 U/L (ref 0–35)
AST: 18 U/L (ref 0–37)
Albumin: 3.2 g/dL — ABNORMAL LOW (ref 3.5–5.2)
BUN: 16 mg/dL (ref 6–23)
CALCIUM: 8.8 mg/dL (ref 8.4–10.5)
CO2: 25 meq/L (ref 19–32)
Chloride: 107 mEq/L (ref 96–112)
Creatinine, Ser: 0.8 mg/dL (ref 0.50–1.10)
GFR calc Af Amer: 77 mL/min — ABNORMAL LOW (ref >90)
GFR calc non Af Amer: 67 mL/min — ABNORMAL LOW (ref >90)
Glucose, Bld: 91 mg/dL (ref 70–99)
Potassium: 3.6 mEq/L — ABNORMAL LOW (ref 3.7–5.3)
Sodium: 143 mEq/L (ref 137–147)
TOTAL PROTEIN: 6.1 g/dL (ref 6.0–8.3)
Total Bilirubin: 0.3 mg/dL (ref 0.3–1.2)

## 2014-03-24 LAB — CBC
HEMATOCRIT: 26.7 % — AB (ref 36.0–46.0)
HEMOGLOBIN: 8.8 g/dL — AB (ref 12.0–15.0)
MCH: 24.6 pg — ABNORMAL LOW (ref 26.0–34.0)
MCHC: 33 g/dL (ref 30.0–36.0)
MCV: 74.8 fL — ABNORMAL LOW (ref 78.0–100.0)
Platelets: 175 10*3/uL (ref 150–400)
RBC: 3.57 MIL/uL — ABNORMAL LOW (ref 3.87–5.11)
RDW: 16.9 % — AB (ref 11.5–15.5)
WBC: 3.8 10*3/uL — AB (ref 4.0–10.5)

## 2014-03-24 LAB — VITAMIN B12: Vitamin B-12: 425 pg/mL (ref 211–911)

## 2014-03-24 LAB — FERRITIN: Ferritin: 11 ng/mL (ref 10–291)

## 2014-03-24 LAB — TROPONIN I
Troponin I: <0.3 ng/mL (ref <0.30)
Troponin I: <0.3 ng/mL (ref <0.30)

## 2014-03-24 LAB — TSH: TSH: 2.59 u[IU]/mL (ref 0.350–4.500)

## 2014-03-24 LAB — FOLATE: Folate: >20 ng/mL

## 2014-03-24 MED ORDER — POTASSIUM CHLORIDE CRYS ER 20 MEQ PO TBCR
20.0000 meq | EXTENDED_RELEASE_TABLET | Freq: Once | ORAL | Status: AC
  Administered 2014-03-24: 20 meq via ORAL
  Filled 2014-03-24: qty 1

## 2014-03-24 MED ORDER — PANTOPRAZOLE SODIUM 40 MG PO TBEC
40.0000 mg | DELAYED_RELEASE_TABLET | Freq: Every day | ORAL | Status: DC
  Administered 2014-03-24 – 2014-03-25 (×2): 40 mg via ORAL
  Filled 2014-03-24 (×2): qty 1

## 2014-03-24 MED ORDER — REGADENOSON 0.4 MG/5ML IV SOLN
0.4000 mg | Freq: Once | INTRAVENOUS | Status: DC
  Filled 2014-03-24: qty 5

## 2014-03-24 NOTE — Progress Notes (Signed)
  Echocardiogram 2D Echocardiogram has been performed.  Evelyn Baird 03/24/2014, 1:43 PM

## 2014-03-24 NOTE — Consult Note (Signed)
CARDIOLOGY CONSULT NOTE   Patient ID: Evelyn Baird MRN: [id number] DOB/AGE: 78/78/1931 78 y.o.  Admit Date: 03/23/2014 Referring Physician: PTH Primary Physician: Fredirick Maudlin, MD Consulting Cardiologist: Carlyle Dolly MD Primary Cardiologist: Rozann Lesches MD Reason for Consultation: Chest Pain  Clinical Summary Evelyn Baird is a 80 y.o.female with known history of non-obstructive CAD with most recent cardiac cath in 2009 via [facility] per office notes, , hypertension, and hyperlipidemia, admitted with chest pain.She states that she had finished eating and felt pressure in her chest, as if she need to burp.She was unable to do so. She took on NTG and felt some relief and burped "a little" but 20 minutes later pressure continued and worsened. She took a second NTG and called EMS. On route, she was given ASA and more NTG, and had relief of pain.    On arrival, BP 102/63 HR 71 with O2 sat of 98%. She was anemic with Hg of 9.0 Hct of 27.8. Cardiac markers were negative. ECG demnstrated normal sinus rhythm ncomplete RBBB and LAFB. CXR was negative for CHF or pulmonary edema, or infective process. She is currently comfortable and without recurrence of chest pain since admission.    No Known Allergies  Medications Scheduled Medications: . sodium chloride   Intravenous STAT  . aspirin EC  81 mg Oral q morning - 10a  . calcium-vitamin D  1 tablet Oral BID  . gabapentin  300 mg Oral BID  . heparin  5,000 Units Subcutaneous 3 times per day  . lisinopril  20 mg Oral q morning - 10a  . sodium chloride  3 mL Intravenous Q12H     Infusions:     PRN Medications:  diphenhydrAMINE, morphine injection, nitroGLYCERIN, ondansetron (ZOFRAN) IV, ondansetron   Past Medical History  Diagnosis Date  . Essential hypertension, benign   . Hyperlipidemia   . Coronary atherosclerosis of native coronary artery     Nonobstructive 2009 - previously followed by [facility]  . Peripheral neuropathy   .  Prediabetes   . PVC's (premature ventricular contractions)     Past Surgical History  Procedure Laterality Date  . Left abdominal hernia repair    . Hernia repair      Family History  Problem Relation Age of Onset  . Hypertension Father   . Kidney disease Father     Social History Evelyn Baird reports that she has never smoked. She has never used smokeless tobacco. Evelyn Baird reports that she does not drink alcohol.  Review of Systems Otherwise reviewed and negative except as outlined.  Physical Examination Blood pressure 105/65, pulse 54, temperature 97.5 F (36.4 C), temperature source Oral, resp. rate 18, height 5\' 5"  (1.651 m), weight 147 lb 4.3 oz (66.8 kg), SpO2 99.00%.  Intake/Output Summary (Last 24 hours) at 03/24/14 0839 Last data filed at 03/24/14 9518  Gross per 24 hour  Intake 508.75 ml  Output    700 ml  Net -191.25 ml    Telemetry:  GEN: HEENT: Conjunctiva and lids normal, oropharynx clear with moist mucosa. Neck: Supple, no elevated JVP or carotid bruits, no thyromegaly. Lungs: Clear to auscultation, nonlabored breathing at rest. Cardiac: Regular rate and rhythm, no S3 or significant systolic murmur, no pericardial rub. Abdomen: Soft, nontender, no hepatomegaly, bowel sounds present, no guarding or rebound. Extremities: No pitting edema, distal pulses 2+. Skin: Warm and dry. Musculoskeletal: No kyphosis. Neuropsychiatric: Alert and oriented x3, affect grossly appropriate.  Prior Cardiac Testing/Procedures 1. Echocardiogram: 2013 Left  ventricle: The cavity size was normal. Wall thickness was normal. Systolic function was normal. The estimated ejection fraction was in the range of 55% to 60%. Wall motion was normal; there were no regional wall motion abnormalities. - Aortic valve: Mildly calcified annulus. Mildly thickened, mildly calcified leaflets. There wasminimal, if any,stenosis. Valve area: 2.59cm^2 (Vmax). - Mitral valve: Calcified  annulus. - Atrial septum: No defect or patent foramen ovale was identified.  2. Cardiac Cath 2009 Requesting records.  Lab Results  Basic Metabolic Panel:  Recent Labs Lab 03/23/14 2048 03/24/14 0408  NA 142 143  K 4.1 3.6*  CL 106 107  CO2 26 25  GLUCOSE 108* 91  BUN 20 16  CREATININE 0.88 0.80  CALCIUM 9.2 8.8    Liver Function Tests:  Recent Labs Lab 03/23/14 2048 03/24/14 0408  AST 20 18  ALT 11 8  ALKPHOS 107 89  BILITOT <0.2* 0.3  PROT 6.8 6.1  ALBUMIN 3.4* 3.2*    CBC:  Recent Labs Lab 03/23/14 2048 03/24/14 0408  WBC 4.3 3.8*  NEUTROABS 2.2  --   HGB 9.0* 8.8*  HCT 27.6* 26.7*  MCV 75.0* 74.8*  PLT 197 175    Cardiac Enzymes:  Recent Labs Lab 03/23/14 2048 03/23/14 2251 03/24/14 0408  TROPONINI <0.30 <0.30 <0.30    Radiology: Dg Chest 2 View  03/23/2014   CLINICAL DATA:  Chest pain, hypertension  EXAM: CHEST  2 VIEW  COMPARISON:  11/07/2012  FINDINGS: Stable cardiomegaly without CHF or pneumonia. Calcific atherosclerosis of the ectatic and tortuous thoracic aorta. Remote granulomatous disease in the upper lobes bilaterally. There is no superimposed consolidation, collapse, effusion, or pneumothorax. Trachea midline. Degenerative changes of the spine. Overall stable exam.  IMPRESSION: Cardiomegaly without acute process  Thoracic atherosclerosis and tortuosity  Remote granulomatous disease  No interval change or acute process   Electronically Signed   By: Daryll Brod M.D.   On: 03/23/2014 21:11     ECG: Sinus rhythm Incomplete RBBB and LAFB   Impression and Recommendations  1. Chest Pain: Known history of non-obstructive CAD per office notes from Dr. Domenic Polite with cath in 2006. I have requested records from [facility] to be faxed. She had chest pressure which she describes as midsternal radiating to left chest, feeling as if she needed to burp. NTG X 2 helped some with some burping occuring. She is now pain free.  Cardiac markers  are negative ruling out ACS, with lateral T-wave abnormalities. Continue ASA.   She will benefit from stress test, which I will plan for the am. She is able to walk, but is frail with fall risk, would suggested Lexiscan. Echocardiogram is pending.   2. Hypertension: Blood pressure is controlled currently. She is on lisinopril 2.5 mg daily. Home medications have her on 20 mg of lisinopril daily. Echo pending.  3. Hypercholesterolemia: Check fasting lipid profile in am. She is not on a statin.   4. Anemia: Anemia profile will be ordered. She has baseline Hgb of 9.8-10.0.   Signed: Phill Myron. Purcell Nails NP [facility] 03/24/2014, 8:39 AM Co-Sign MD  Patient seen and discussed with NP Purcell Nails, I agree with her documentation above. 80 yo female hx of HTN, HL, non-obstructive CAD by cath in 2009, prediabetes admitted with chest pain. Mixed symptoms for cardiac pain, however she has multiple CAD risk factors.  EKG NSR with RBBB and LAFB and no ischemic changes. Troponins negative x 3.  Plan for stess test tomorrow.  Carlyle Dolly MD

## 2014-03-24 NOTE — Plan of Care (Signed)
Problem: Phase II Progression Outcomes Goal: Stress Test if indicated Outcome: Progressing Stress test scheduled for tomorrow.

## 2014-03-24 NOTE — Progress Notes (Signed)
[facility] PROGRESS NOTE  Evelyn Baird WFU:[id number] DOB: 07-18-1930 DOA: 03/23/2014 PCP: Fredirick Maudlin, MD  Assessment/Plan: 1-Chest pain; Troponin times 3 negative. Chest pain free. ECHO pending. For stress test tomorrow. Continue with aspirin, PRN nitroglycerin./   2-Hypertension; Continue with lisinopril.   3-Anemia; Hb stable at 8.8. Anemia panel pending. Need out patient work up. Occult blood negative,.  Code Status: presume full code.  Family Communication: care discussed with patient.  Disposition Plan: Stress test tomorrow.    Consultants:  Cardiology  Procedures:  ECHO P  Antibiotics:  none  HPI/Subjective: No chest pain, feeling well.   Objective: Filed Vitals:   03/24/14 0921  BP: 126/67  Pulse:   Temp:   Resp:     Intake/Output Summary (Last 24 hours) at 03/24/14 1236 Last data filed at 03/24/14 1008  Gross per 24 hour  Intake 748.75 ml  Output   1000 ml  Net -251.25 ml   Filed Weights   03/23/14 2323  Weight: 66.8 kg (147 lb 4.3 oz)    Exam:   General:  No distress.   Cardiovascular: S 1, S 2 RRR  Respiratory: CTA  Abdomen: BS present, soft, nt  Musculoskeletal: trace edema.   Data Reviewed: Basic Metabolic Panel:  Recent Labs Lab 03/23/14 2048 03/24/14 0408  NA 142 143  K 4.1 3.6*  CL 106 107  CO2 26 25  GLUCOSE 108* 91  BUN 20 16  CREATININE 0.88 0.80  CALCIUM 9.2 8.8   Liver Function Tests:  Recent Labs Lab 03/23/14 2048 03/24/14 0408  AST 20 18  ALT 11 8  ALKPHOS 107 89  BILITOT <0.2* 0.3  PROT 6.8 6.1  ALBUMIN 3.4* 3.2*   No results found for this basename: LIPASE, AMYLASE,  in the last 168 hours No results found for this basename: AMMONIA,  in the last 168 hours CBC:  Recent Labs Lab 03/23/14 2048 03/24/14 0408  WBC 4.3 3.8*  NEUTROABS 2.2  --   HGB 9.0* 8.8*  HCT 27.6* 26.7*  MCV 75.0* 74.8*  PLT 197 175   Cardiac Enzymes:  Recent Labs Lab 03/23/14 2048 03/23/14 2251  03/24/14 0408 03/24/14 1018  TROPONINI <0.30 <0.30 <0.30 <0.30   BNP (last 3 results) No results found for this basename: PROBNP,  in the last 8760 hours CBG: No results found for this basename: GLUCAP,  in the last 168 hours  No results found for this or any previous visit (from the past 240 hour(s)).   Studies: Dg Chest 2 View  03/23/2014   CLINICAL DATA:  Chest pain, hypertension  EXAM: CHEST  2 VIEW  COMPARISON:  11/07/2012  FINDINGS: Stable cardiomegaly without CHF or pneumonia. Calcific atherosclerosis of the ectatic and tortuous thoracic aorta. Remote granulomatous disease in the upper lobes bilaterally. There is no superimposed consolidation, collapse, effusion, or pneumothorax. Trachea midline. Degenerative changes of the spine. Overall stable exam.  IMPRESSION: Cardiomegaly without acute process  Thoracic atherosclerosis and tortuosity  Remote granulomatous disease  No interval change or acute process   Electronically Signed   By: Daryll Brod M.D.   On: 03/23/2014 21:11    Scheduled Meds: . aspirin EC  81 mg Oral q morning - 10a  . calcium-vitamin D  1 tablet Oral BID  . gabapentin  300 mg Oral BID  . heparin  5,000 Units Subcutaneous 3 times per day  . lisinopril  20 mg Oral q morning - 10a  . regadenoson  0.4 mg Intravenous Once  .  sodium chloride  3 mL Intravenous Q12H   Continuous Infusions:   Active Problems:   HYPERLIPIDEMIA   Essential hypertension, benign   Coronary atherosclerosis of native coronary artery   Chest pain   Anemia    Time spent: 30 minutes.     [facility] Pager [phone]. If 7PM-7AM, please contact night-coverage at [email or web address], password [facility] 03/24/2014, 12:36 PM  LOS: 1 day

## 2014-03-24 NOTE — Care Management Note (Signed)
UR completed 

## 2014-03-25 ENCOUNTER — Inpatient Hospital Stay (HOSPITAL_COMMUNITY): Payer: PRIVATE HEALTH INSURANCE

## 2014-03-25 ENCOUNTER — Encounter (HOSPITAL_COMMUNITY): Payer: Self-pay

## 2014-03-25 LAB — BASIC METABOLIC PANEL
BUN: 15 mg/dL (ref 6–23)
CO2: 25 meq/L (ref 19–32)
Calcium: 9.6 mg/dL (ref 8.4–10.5)
Chloride: 102 mEq/L (ref 96–112)
Creatinine, Ser: 0.8 mg/dL (ref 0.50–1.10)
GFR calc Af Amer: 77 mL/min — ABNORMAL LOW (ref >90)
GFR calc non Af Amer: 67 mL/min — ABNORMAL LOW (ref >90)
GLUCOSE: 169 mg/dL — AB (ref 70–99)
Potassium: 4 mEq/L (ref 3.7–5.3)
SODIUM: 140 meq/L (ref 137–147)

## 2014-03-25 LAB — LIPID PANEL
CHOL/HDL RATIO: 2.9 ratio
Cholesterol: 232 mg/dL — ABNORMAL HIGH (ref 0–200)
HDL: 80 mg/dL (ref >39)
LDL CALC: 134 mg/dL — AB (ref 0–99)
Triglycerides: 89 mg/dL (ref <150)
VLDL: 18 mg/dL (ref 0–40)

## 2014-03-25 LAB — IRON AND TIBC
IRON: 39 ug/dL — AB (ref 42–135)
Saturation Ratios: 12 % — ABNORMAL LOW (ref 20–55)
TIBC: 335 ug/dL (ref 250–470)
UIBC: 296 ug/dL (ref 125–400)

## 2014-03-25 MED ORDER — TECHNETIUM TC 99M SESTAMIBI GENERIC - CARDIOLITE
10.0000 | Freq: Once | INTRAVENOUS | Status: AC | PRN
  Administered 2014-03-25: 10 via INTRAVENOUS

## 2014-03-25 MED ORDER — REGADENOSON 0.4 MG/5ML IV SOLN
INTRAVENOUS | Status: AC
Start: 2014-03-25 — End: 2014-03-25
  Administered 2014-03-25: 0.4 mg via INTRAVENOUS
  Filled 2014-03-25: qty 5

## 2014-03-25 MED ORDER — FERROUS SULFATE 325 (65 FE) MG PO TABS: 325.0000 mg | ORAL_TABLET | Freq: Two times a day (BID) | ORAL | Status: DC

## 2014-03-25 MED ORDER — PANTOPRAZOLE SODIUM 40 MG PO TBEC: 40.0000 mg | DELAYED_RELEASE_TABLET | Freq: Every day | ORAL | Status: DC

## 2014-03-25 MED ORDER — SODIUM CHLORIDE 0.9 % IV BOLUS (SEPSIS)
250.0000 mL | Freq: Once | INTRAVENOUS | Status: AC
  Administered 2014-03-25: 250 mL via INTRAVENOUS

## 2014-03-25 MED ORDER — TECHNETIUM TC 99M SESTAMIBI - CARDIOLITE
30.0000 | Freq: Once | INTRAVENOUS | Status: AC | PRN
  Administered 2014-03-25: 10:00:00 30 via INTRAVENOUS

## 2014-03-25 MED ORDER — ATORVASTATIN CALCIUM 40 MG PO TABS: 40.0000 mg | ORAL_TABLET | Freq: Every day | ORAL | Status: DC

## 2014-03-25 MED ORDER — SODIUM CHLORIDE 0.9 % IJ SOLN
INTRAMUSCULAR | Status: AC
  Administered 2014-03-25: 10 mL via INTRAVENOUS
  Filled 2014-03-25: qty 10

## 2014-03-25 NOTE — Progress Notes (Signed)
OT Cancellation Note  Patient Details Name: Evelyn Baird MRN: [id number] DOB: 01-14-1930   Cancelled Treatment:    Reason Eval/Treat Not Completed: Patient at procedure or test/ unavailable. Pt at stress test - will reattempt OT eval at later time/date.  Bea Graff, [place], OTR/L [phone]  03/25/2014, 10:39 AM

## 2014-03-25 NOTE — Progress Notes (Signed)
Consulting cardiologist:Hedda Crumbley, Roderic Palau MD Primary Cardiologist: Rozann Lesches MD  Subjective:   No recurrence of chest pain. Some back pain.   Objective:   Temp:  [97.8 F (36.6 C)-98.1 F (36.7 C)] 97.9 F (36.6 C) (05/15 0300) Pulse Rate:  [58-60] 60 (05/15 0300) Resp:  [20] 20 (05/15 0300) BP: (122-140)/(47-67) 140/60 mmHg (05/15 0300) SpO2:  [99 %] 99 % (05/15 0300) Last BM Date: 03/23/14  Filed Weights   03/23/14 2323  Weight: 147 lb 4.3 oz (66.8 kg)    Intake/Output Summary (Last 24 hours) at 03/25/14 0914 Last data filed at 03/24/14 1624  Gross per 24 hour  Intake  612.5 ml  Output    600 ml  Net   12.5 ml    Telemetry:NSR  Exam:  General: No acute distress.  HEENT: Conjunctiva and lids normal, oropharynx clear.  Lungs: Clear to auscultation, nonlabored.  Cardiac: No elevated JVP or bruits. RRR, no gallop or rub.   Abdomen: Normoactive bowel sounds, nontender, nondistended.  Extremities: No pitting edema, distal pulses full.  Neuropsychiatric: Alert and oriented x3, affect appropriate.  Echocardiogram 03/24/2014 Study data: Technically difficult study. - Left ventricle: The cavity size was normal. Wall thickness was increased in a pattern of mild LVH. Systolic function was normal. The estimated ejection fraction was in the range of 60% to 65%. Doppler parameters are consistent with abnormal left ventricular relaxation (grade 1 diastolic dysfunction). - Aortic valve: Mildly calcified annulus. Trileaflet; moderately thickened leaflets. - Mitral valve: Mildly calcified annulus. Mildly thickened leaflets . - Left atrium: The atrium was moderately dilated. - Right ventricle: The cavity size was mildly dilated. - Right atrium: The atrium was moderately dilated. - Tricuspid valve: Moderate regurgitation.   Lab Results:  Basic Metabolic Panel:  Recent Labs Lab 03/23/14 2048 03/24/14 0408  NA 142 143  K 4.1 3.6*  CL 106 107  CO2 26  25  GLUCOSE 108* 91  BUN 20 16  CREATININE 0.88 0.80  CALCIUM 9.2 8.8    Liver Function Tests:  Recent Labs Lab 03/23/14 2048 03/24/14 0408  AST 20 18  ALT 11 8  ALKPHOS 107 89  BILITOT <0.2* 0.3  PROT 6.8 6.1  ALBUMIN 3.4* 3.2*    CBC:  Recent Labs Lab 03/23/14 2048 03/24/14 0408  WBC 4.3 3.8*  HGB 9.0* 8.8*  HCT 27.6* 26.7*  MCV 75.0* 74.8*  PLT 197 175    Cardiac Enzymes:  Recent Labs Lab 03/23/14 2251 03/24/14 0408 03/24/14 1018  TROPONINI <0.30 <0.30 <0.30    Coagulation:  Recent Labs Lab 03/23/14 2048  INR 0.93    Radiology: Dg Chest 2 View  03/23/2014   CLINICAL DATA:  Chest pain, hypertension  EXAM: CHEST  2 VIEW  COMPARISON:  11/07/2012  FINDINGS: Stable cardiomegaly without CHF or pneumonia. Calcific atherosclerosis of the ectatic and tortuous thoracic aorta. Remote granulomatous disease in the upper lobes bilaterally. There is no superimposed consolidation, collapse, effusion, or pneumothorax. Trachea midline. Degenerative changes of the spine. Overall stable exam.  IMPRESSION: Cardiomegaly without acute process  Thoracic atherosclerosis and tortuosity  Remote granulomatous disease  No interval change or acute process   Electronically Signed   By: Daryll Brod M.D.   On: 03/23/2014 21:11      Medications:   Scheduled Medications: . aspirin EC  81 mg Oral q morning - 10a  . calcium-vitamin D  1 tablet Oral BID  . gabapentin  300 mg Oral BID  . heparin  5,000  Units Subcutaneous 3 times per day  . lisinopril  20 mg Oral q morning - 10a  . pantoprazole  40 mg Oral Daily  . regadenoson  0.4 mg Intravenous Once  . sodium chloride  3 mL Intravenous Q12H       PRN Medications: diphenhydrAMINE, morphine injection, nitroGLYCERIN, ondansetron (ZOFRAN) IV, ondansetron, technetium sestamibi, technetium sestamibi generic   Assessment and Plan:   1. Chest Pain: Known history of non-obstructive CAD per office notes from Dr. Domenic Polite with cath  in 2006. She is to have stress test this am.   2. Hypertension: Blood pressure is controlled currently in HD6Q 229'N systolic on review of trends.  She is on lisinopril 2.5 mg daily.Echo normal EF with grade I diastolic dysfunction. Moderately dilated LA.  3. Hypercholesterolemia:  Reviewed lipid profile. LDL 134 with TC of 232. Will begin statin.   4. Anemia:  She has baseline Hgb of 9.8-10.0. Await full anemia profile results  Phill Myron. Purcell Nails NP [facility] 03/25/2014, 9:14 AM   Attending Note  Patient seen and discussed with NP Purcell Nails. Agree with her documentation above. No evidence of ACS by EKG or enzymes. Echo with normal LV function, grade I diastolic dysfunction. No significant valvular pathology. She will have Lexiscan MPI today, will follow up results.   Zandra Abts MD

## 2014-03-25 NOTE — Evaluation (Signed)
Physical Therapy Evaluation Patient Details Name: Evelyn Baird MRN: [id number] DOB: 1930/10/21 Today's Date: 03/25/2014   History of Present Illness  This is an 80 year old lady, who has a history of nonobstructive coronary artery disease, who presents with a couple of episodes of chest pain at rest which have been relieved with sublingual nitroglycerin. She first got the chest pain around 5 PM today at rest. It did not seem to radiate much. There is no dyspnea or nausea or vomiting associated with it. She came to the emergency room and sublingual nitroglycerin seems to have helped her. She subsequently got another chest pain which similarly was relieved. She is currently chest pain-free. She is now being admitted for further management.  Clinical Impression  No treatment given                                           Leeroy Cha 03/25/2014, 9:47 AM

## 2014-03-25 NOTE — Discharge Instructions (Signed)

## 2014-03-25 NOTE — Progress Notes (Signed)
Patient discharged with instructions, prescriptions, and carenotes. She verbalized understanding via teach back method.  The patient left the floor with staff via w/c in stable condition.  

## 2014-03-25 NOTE — Progress Notes (Signed)
Lexiscan negative for ischemia. No further cardiac testing planned at this time. Will signoff of inpatient care, please call with questions. She may follow up with NP Purcell Nails in 2-3 weeks.    Zandra Abts MD

## 2014-03-25 NOTE — Discharge Summary (Signed)
Physician Discharge Summary  JASMEET MANTON ZWC:[id number] DOB: 1930/03/18 DOA: 03/23/2014  PCP: Fredirick Maudlin, MD  Admit date: 03/23/2014 Discharge date: 03/25/2014  Time spent: 35 minutes  Recommendations for Outpatient Follow-up:  1. Follow up with cardio in 3 weeks.  2. Need repeat Lipid panel in 3 months.   Discharge Diagnoses:    Chest pain   HYPERLIPIDEMIA   Essential hypertension, benign   Coronary atherosclerosis of native coronary artery   Anemia   Discharge Condition: stable.   Diet recommendation: Heart Healthy  Filed Weights   03/23/14 2323  Weight: 66.8 kg (147 lb 4.3 oz)    History of present illness:  Evelyn Baird is a 80 y.o. female  This is an 80 year old lady, who has a history of nonobstructive coronary artery disease, who presents with a couple of episodes of chest pain at rest which have been relieved with sublingual nitroglycerin. She first got the chest pain around 5 PM today at rest. It did not seem to radiate much. There is no dyspnea or nausea or vomiting associated with it. She came to the emergency room and sublingual nitroglycerin seems to have helped her. She subsequently got another chest pain which similarly was relieved. She is currently chest pain-free. She is now being admitted for further management.   Hospital Course:  1-Chest pain; Troponin times 3 negative. Chest pain free. ECHO with normal EF. lesiscan negative for ischemia. . Continue with aspirin, PRN nitroglycerin./ continue with protonix.  2-Hypertension; Continue with lisinopril.  3-Anemia; Hb stable at 8.8. Anemia panel iron low at 39, ferritin at 11, B 12 425. Need out patient work up, colonoscopy, defer to PCP. Marland Kitchen Occult blood negative,. Will provide prescription for iron.  4-Hyperlipidemia; she run out of lipitor. Will provide refill.   Family decline assistance or home health.   Procedures: Lexiscan: 1. Negative Lexiscan MPI for ischemia  2. Normal left ventricular systolic  function, LVEF 23%  3. Low risk study for major cardiac events  ECHO;  Study data: Technically difficult study. - Left ventricle: The cavity size was normal. Wall thickness was increased in a pattern of mild LVH. Systolic function was normal. The estimated ejection fraction was in the range of 60% to 65%. Doppler parameters are consistent with abnormal left ventricular relaxation (grade 1 diastolic dysfunction).  Consultations:  Cardiology  Discharge Exam: Filed Vitals:   03/25/14 0300  BP: 140/60  Pulse: 60  Temp: 97.9 F (36.6 C)  Resp: 20    General: No distress.  Cardiovascular: S 1, S 2 RRR Respiratory: CTA  Discharge Instructions You were cared for by a hospitalist during your hospital stay. If you have any questions about your discharge medications or the care you received while you were in the hospital after you are discharged, you can call the unit and asked to speak with the hospitalist on call if the hospitalist that took care of you is not available. Once you are discharged, your primary care physician will handle any further medical issues. Please note that NO REFILLS for any discharge medications will be authorized once you are discharged, as it is imperative that you return to your primary care physician (or establish a relationship with a primary care physician if you do not have one) for your aftercare needs so that they can reassess your need for medications and monitor your lab values.  Discharge Instructions   Diet - low sodium heart healthy    Complete by:  As directed  Increase activity slowly    Complete by:  As directed             Medication List         aspirin EC 81 MG tablet  Take 81 mg by mouth every morning.     atorvastatin 40 MG tablet  Commonly known as:  LIPITOR  Take 1 tablet (40 mg total) by mouth daily at 6 PM.     CALCIUM 600 + D 600-200 MG-UNIT Tabs  Generic drug:  Calcium Carb-Cholecalciferol  Take 1 tablet by mouth 2  (two) times daily.     [employer] LEG CRAMPS PAIN RELIEF PO  Take 1 tablet by mouth daily as needed. Foot cramps.     diphenhydrAMINE 25 MG tablet  Commonly known as:  BENADRYL  Take 25 mg by mouth every 6 (six) hours as needed. Itching.     ferrous sulfate 325 (65 FE) MG tablet  Take 1 tablet (325 mg total) by mouth 2 (two) times daily with a meal.     gabapentin 300 MG capsule  Commonly known as:  NEURONTIN  Take 300 mg by mouth 2 (two) times daily.     lisinopril 20 MG tablet  Commonly known as:  PRINIVIL,ZESTRIL  Take 20 mg by mouth every morning.     nitroGLYCERIN 0.4 MG SL tablet  Commonly known as:  NITROSTAT  Place 0.4 mg under the tongue every 5 (five) minutes as needed. Chest pain.     pantoprazole 40 MG tablet  Commonly known as:  PROTONIX  Take 1 tablet (40 mg total) by mouth daily.     Potassium 99 MG Tabs  Take 1 tablet by mouth daily.       No Known Allergies    The results of significant diagnostics from this hospitalization (including imaging, microbiology, ancillary and laboratory) are listed below for reference.    Significant Diagnostic Studies: Dg Chest 2 View  03/23/2014   CLINICAL DATA:  Chest pain, hypertension  EXAM: CHEST  2 VIEW  COMPARISON:  11/07/2012  FINDINGS: Stable cardiomegaly without CHF or pneumonia. Calcific atherosclerosis of the ectatic and tortuous thoracic aorta. Remote granulomatous disease in the upper lobes bilaterally. There is no superimposed consolidation, collapse, effusion, or pneumothorax. Trachea midline. Degenerative changes of the spine. Overall stable exam.  IMPRESSION: Cardiomegaly without acute process  Thoracic atherosclerosis and tortuosity  Remote granulomatous disease  No interval change or acute process   Electronically Signed   By: Daryll Brod M.D.   On: 03/23/2014 21:11   Nm Myocar Single W/spect W/wall Motion And Ef  03/25/2014   CLINICAL DATA:  80 year old female with no known history of coronary artery disease  referred for chest pain.  EXAM: MYOCARDIAL IMAGING WITH SPECT (REST AND PHARMACOLOGIC-STRESS)  GATED LEFT VENTRICULAR WALL MOTION STUDY  LEFT VENTRICULAR EJECTION FRACTION  TECHNIQUE: Standard myocardial SPECT imaging was performed after resting intravenous injection of 10 mCi Tc-71m sestamibi. Subsequently, intravenous infusion of Lexiscan was performed under the supervision of the Cardiology staff. At peak effect of the drug, 30 mCi Tc-65m sestamibi was injected intravenously and standard myocardial SPECT imaging was performed. Quantitative gated imaging was also performed to evaluate left ventricular wall motion, and estimate left ventricular ejection fraction.  COMPARISON:  None.  FINDINGS: Pharmacological stress  Baseline EKG showed normal sinus rhythm with nonspecific ST/T changes. After injection the resting heart rate increased from 72 beats per min up to 115 beats per min and blood pressure decreased from 120/70 down  to 97/54. The test was stopped after injection was complete, the patient did not experience any chest pain.  Post-injection EKG showed no specific ischemic changes and no significant arrhythmias.  Myocardial perfusion imaging  Raw images showed appropriate radiotracer uptake. There was a small apical defect seen in the resting images, this defect was less intense in the post-injection images. The apex had normal wall motion, overall findings consistent with apical thinning. There was a moderate-sized inferior wall defect seen at rest, this defect was less intense in the post-injection images. The inferior wall had normal wall motion, overall findings consistent with sub- diaphragmatic attenuation. There were no other myocardial perfusion defects.  Gated imaging showed end-diastolic volume 78 mL, and systolic volume 34 mL, left ventricular ejection fraction 56%, TID 1.10. Wall motion was normal.  IMPRESSION: 1.  Negative Lexiscan MPI for ischemia  2.  Normal left ventricular systolic function,  LVEF 93%  3.  Low risk study for major cardiac events   Electronically Signed   By: Carlyle Dolly   On: 03/25/2014 12:14    Microbiology: No results found for this or any previous visit (from the past 240 hour(s)).   Labs: Basic Metabolic Panel:  Recent Labs Lab 03/23/14 2048 03/24/14 0408 03/25/14 1310  NA 142 143 140  K 4.1 3.6* 4.0  CL 106 107 102  CO2 26 25 25   GLUCOSE 108* 91 169*  BUN 20 16 15   CREATININE 0.88 0.80 0.80  CALCIUM 9.2 8.8 9.6   Liver Function Tests:  Recent Labs Lab 03/23/14 2048 03/24/14 0408  AST 20 18  ALT 11 8  ALKPHOS 107 89  BILITOT <0.2* 0.3  PROT 6.8 6.1  ALBUMIN 3.4* 3.2*   No results found for this basename: LIPASE, AMYLASE,  in the last 168 hours No results found for this basename: AMMONIA,  in the last 168 hours CBC:  Recent Labs Lab 03/23/14 2048 03/24/14 0408  WBC 4.3 3.8*  NEUTROABS 2.2  --   HGB 9.0* 8.8*  HCT 27.6* 26.7*  MCV 75.0* 74.8*  PLT 197 175   Cardiac Enzymes:  Recent Labs Lab 03/23/14 2048 03/23/14 2251 03/24/14 0408 03/24/14 1018  TROPONINI <0.30 <0.30 <0.30 <0.30   BNP: BNP (last 3 results) No results found for this basename: PROBNP,  in the last 8760 hours CBG: No results found for this basename: GLUCAP,  in the last 168 hours     Signed:  Audie Wieser A Kayon Dozier  [facility] 03/25/2014, 2:23 PM

## 2014-03-25 NOTE — Progress Notes (Addendum)
Stress Lab Nurses Notes - [place] 03/25/2014 Reason for doing test: Chest Pain Type of test: Wille Glaser Nurse performing test: Gerrit Halls, RN Nuclear Medicine Tech: Dyanne Carrel Echo Tech: Not Applicable MD performing test: Branch/K.Lawrence NP Family MD: PTH Test explained and consent signed: yes IV started: Saline lock flushed, No redness or edema and Saline lock from floor Symptoms: nausea, Flushed & lightheaded Treatment/Intervention: None Reason test stopped: protocol completed After recovery IV was: No redness or edema and Saline Lock flushed Patient to return to [place]. Med at :10:15 Patient discharged: Transported back to room 332 via wc Patient's Condition upon discharge was: stable Comments: During test BP 97/54 & HR 115 .  Recovery BP 64/52, Bolus of 250 cc NS given IV & HR 64 .  Repeat BP 101/57 & HR 68. Feeling better now.  Symptoms resolved in recovery. Donnajean Lopes

## 2014-04-11 ENCOUNTER — Emergency Department (HOSPITAL_COMMUNITY): Payer: PRIVATE HEALTH INSURANCE

## 2014-04-11 ENCOUNTER — Emergency Department (HOSPITAL_COMMUNITY)
Admission: EM | Admit: 2014-04-11 | Discharge: 2014-04-11 | Disposition: A | Discharge status: Home / Self Care | Payer: PRIVATE HEALTH INSURANCE | Attending: Emergency Medicine | Admitting: Emergency Medicine

## 2014-04-11 ENCOUNTER — Encounter (HOSPITAL_COMMUNITY): Payer: Self-pay | Admitting: Emergency Medicine

## 2014-04-11 DIAGNOSIS — E785 Hyperlipidemia, unspecified: Secondary | ICD-10-CM | POA: Insufficient documentation

## 2014-04-11 DIAGNOSIS — I2510 Atherosclerotic heart disease of native coronary artery without angina pectoris: Secondary | ICD-10-CM | POA: Insufficient documentation

## 2014-04-11 DIAGNOSIS — R071 Chest pain on breathing: Secondary | ICD-10-CM | POA: Insufficient documentation

## 2014-04-11 DIAGNOSIS — Z7982 Long term (current) use of aspirin: Secondary | ICD-10-CM | POA: Insufficient documentation

## 2014-04-11 DIAGNOSIS — R05 Cough: Secondary | ICD-10-CM | POA: Insufficient documentation

## 2014-04-11 DIAGNOSIS — I10 Essential (primary) hypertension: Secondary | ICD-10-CM | POA: Insufficient documentation

## 2014-04-11 DIAGNOSIS — R079 Chest pain, unspecified: Secondary | ICD-10-CM | POA: Diagnosis present

## 2014-04-11 DIAGNOSIS — R059 Cough, unspecified: Secondary | ICD-10-CM | POA: Diagnosis not present

## 2014-04-11 DIAGNOSIS — G609 Hereditary and idiopathic neuropathy, unspecified: Secondary | ICD-10-CM | POA: Diagnosis not present

## 2014-04-11 LAB — BASIC METABOLIC PANEL
BUN: 15 mg/dL (ref 6–23)
CALCIUM: 9.3 mg/dL (ref 8.4–10.5)
CO2: 25 mEq/L (ref 19–32)
CREATININE: 0.84 mg/dL (ref 0.50–1.10)
Chloride: 103 mEq/L (ref 96–112)
GFR calc Af Amer: 73 mL/min — ABNORMAL LOW (ref >90)
GFR, EST NON AFRICAN AMERICAN: 63 mL/min — AB (ref >90)
GLUCOSE: 83 mg/dL (ref 70–99)
Potassium: 4.2 mEq/L (ref 3.7–5.3)
Sodium: 142 mEq/L (ref 137–147)

## 2014-04-11 LAB — CBC WITH DIFFERENTIAL/PLATELET
Basophils Absolute: 0 10*3/uL (ref 0.0–0.1)
Basophils Relative: 0 % (ref 0–1)
EOS ABS: 0.1 10*3/uL (ref 0.0–0.7)
EOS PCT: 2 % (ref 0–5)
HCT: 32.1 % — ABNORMAL LOW (ref 36.0–46.0)
HEMOGLOBIN: 10.4 g/dL — AB (ref 12.0–15.0)
Lymphocytes Relative: 35 % (ref 12–46)
Lymphs Abs: 1.6 10*3/uL (ref 0.7–4.0)
MCH: 25.2 pg — AB (ref 26.0–34.0)
MCHC: 32.4 g/dL (ref 30.0–36.0)
MCV: 77.9 fL — AB (ref 78.0–100.0)
MONO ABS: 0.3 10*3/uL (ref 0.1–1.0)
MONOS PCT: 7 % (ref 3–12)
Neutro Abs: 2.7 10*3/uL (ref 1.7–7.7)
Neutrophils Relative %: 56 % (ref 43–77)
Platelets: 209 10*3/uL (ref 150–400)
RBC: 4.12 MIL/uL (ref 3.87–5.11)
RDW: 19 % — ABNORMAL HIGH (ref 11.5–15.5)
WBC: 4.7 10*3/uL (ref 4.0–10.5)

## 2014-04-11 LAB — TROPONIN I: Troponin I: <0.3 ng/mL (ref <0.30)

## 2014-04-11 NOTE — ED Notes (Signed)
Pt states she is having mild pain in chest, cough at night, with clear drainage

## 2014-04-11 NOTE — Progress Notes (Signed)
HPI: Evelyn Baird is an 80 year old patient of Dr. Harl Bowie we are following for ongoing assessment and management of hypertension and chest pain. She said her recent admission to [facility] for recurrent chest pain with known history of non-obstructive CAD with most recent cardiac cath in 2009 via [facility] per office notes, , hypertension, and hyperlipidemia, admitted with chest pain.She states that she had finished eating and felt pressure in her chest, as if she need to burp.  During hospitalization, she was found to be negative for acute coronary syndrome, and Lexiscan Myoview was completed in May of 2015 which was negative for ischemia, low risk study for major cardiac events with an EF of 56%. Echocardiogram was completed revealing normal LV systolic function EF of 7026%. Doppler parameters are consistent with abnormal left ventricular relaxation grade 1 diastolic dysfunction.  She was seen again in the emergency room on 04/11/2014 with recurrent chest pain on the left blood pressure was 97/65 with a pulse of 74. No interventions or admission was completed during the ER evaluation. She is here for posthospitalization in ER followup.  She is doing well. Continues to have gas pain in her abdomen and sometimes. She is otherwise without complaints.   Allergies  Allergen Reactions  . Beef-Derived Products Itching and Nausea And Vomiting  . Pork-Derived Products Itching and Nausea And Vomiting    Current Outpatient Prescriptions  Medication Sig Dispense Refill  . aspirin EC 81 MG tablet Take 81 mg by mouth every morning.       Marland Kitchen atorvastatin (LIPITOR) 40 MG tablet Take 1 tablet (40 mg total) by mouth daily at 6 PM.  30 tablet  0  . Calcium Carb-Cholecalciferol (CALCIUM 600 + D) 600-200 MG-UNIT TABS Take 1 tablet by mouth 2 (two) times daily.      . diphenhydrAMINE (BENADRYL) 25 MG tablet Take 25 mg by mouth every 6 (six) hours as needed. Itching.      . ferrous sulfate 325 (65 FE) MG  tablet Take 1 tablet (325 mg total) by mouth 2 (two) times daily with a meal.  30 tablet  3  . gabapentin (NEURONTIN) 300 MG capsule Take 300 mg by mouth 2 (two) times daily.      . Homeopathic Products ([employer] LEG CRAMPS PAIN RELIEF PO) Take 1 tablet by mouth daily as needed. Foot cramps.      Marland Kitchen lisinopril (PRINIVIL,ZESTRIL) 20 MG tablet       . nitroGLYCERIN (NITROSTAT) 0.4 MG SL tablet Place 0.4 mg under the tongue every 5 (five) minutes as needed. Chest pain.      Marland Kitchen Potassium 99 MG TABS Take 1 tablet by mouth daily.       No current facility-administered medications for this visit.    Past Medical History  Diagnosis Date  . Essential hypertension, benign   . Hyperlipidemia   . Coronary atherosclerosis of native coronary artery     Nonobstructive 2009 - previously followed by [facility]  . Peripheral neuropathy   . Prediabetes   . PVC's (premature ventricular contractions)     Past Surgical History  Procedure Laterality Date  . Left abdominal hernia repair    . Hernia repair      ROS: Review of systems complete and found to be negative unless listed above  PHYSICAL EXAM BP 128/88  Pulse 60  Ht 5\' 4"  (1.626 m)  Wt 143 lb (64.864 kg)  BMI 24.53 kg/m2  SpO2 97% General: Well developed, well nourished, in no  acute distress Head: Eyes PERRLA, No xanthomas.   Normal cephalic and atramatic  Lungs: Clear bilaterally to auscultation and percussion. Heart: HRRR S1 S2, without MRG.  Pulses are 2+ & equal.            No carotid bruit. Occasional extra systole,  No JVD.  No abdominal bruits. No femoral bruits. Abdomen: Bowel sounds are positive, abdomen soft and non-tender without masses or                  Hernia's noted. Msk:  Back normal, normal gait. Normal strength and tone for age. Extremities: No clubbing, cyanosis or edema.  DP +1 Neuro: Alert and oriented X 3. Psych:  Good affect, responds appropriately  ASSESSMENT AND PLAN

## 2014-04-11 NOTE — ED Provider Notes (Signed)
CSN: [id number]     Arrival date & time 04/11/14  1053 History   First MD Initiated Contact with Patient 04/11/14 1132     Chief Complaint  Patient presents with  . Chest Pain     (Consider location/radiation/quality/duration/timing/severity/associated sxs/prior Treatment) Patient is a 80 y.o. female presenting with chest pain. The history is provided by the patient.  Chest Pain Pain location:  L chest Associated symptoms: cough   Associated symptoms: no abdominal pain, no back pain, no headache, no nausea, no numbness, no shortness of breath, not vomiting and no weakness    patient presents with left-sided lower chest pain. Began couple hours prior to arrival, but has had episodes of the same and was recently admitted hospital. She had a negative stress test and negative enzymes at that time. She is to follow up with cardiology tomorrow. She's had a cough with some sputum. No swelling or legs. No fevers. No change in her activity.  Past Medical History  Diagnosis Date  . Essential hypertension, benign   . Hyperlipidemia   . Coronary atherosclerosis of native coronary artery     Nonobstructive 2009 - previously followed by [facility]  . Peripheral neuropathy   . Prediabetes   . PVC's (premature ventricular contractions)    Past Surgical History  Procedure Laterality Date  . Left abdominal hernia repair    . Hernia repair     Family History  Problem Relation Age of Onset  . Hypertension Father   . Kidney disease Father    History  Substance Use Topics  . Smoking status: Never Smoker   . Smokeless tobacco: Never Used  . Alcohol Use: No   OB History   Grav Para Term Preterm Abortions TAB SAB Ect Mult Living                 Review of Systems  Constitutional: Negative for activity change and appetite change.  Eyes: Negative for pain.  Respiratory: Positive for cough. Negative for chest tightness and shortness of breath.   Cardiovascular: Positive for chest pain. Negative for  leg swelling.  Gastrointestinal: Negative for nausea, vomiting, abdominal pain and diarrhea.  Genitourinary: Negative for flank pain.  Musculoskeletal: Negative for back pain and neck stiffness.  Skin: Negative for rash.  Neurological: Negative for weakness, numbness and headaches.  Psychiatric/Behavioral: Negative for behavioral problems.      Allergies  Beef-derived products and Pork-derived products  Home Medications   Prior to Admission medications   Medication Sig Start Date End Date Taking? Authorizing Provider  aspirin EC 81 MG tablet Take 81 mg by mouth every morning.    Yes Historical Provider, MD  atorvastatin (LIPITOR) 40 MG tablet Take 1 tablet (40 mg total) by mouth daily at 6 PM. 03/25/14  Yes Belkys A Regalado, MD  Calcium Carb-Cholecalciferol (CALCIUM 600 + D) 600-200 MG-UNIT TABS Take 1 tablet by mouth 2 (two) times daily.   Yes Historical Provider, MD  diphenhydrAMINE (BENADRYL) 25 MG tablet Take 25 mg by mouth every 6 (six) hours as needed. Itching.   Yes Historical Provider, MD  ferrous sulfate 325 (65 FE) MG tablet Take 1 tablet (325 mg total) by mouth 2 (two) times daily with a meal. 03/25/14  Yes Belkys A Regalado, MD  gabapentin (NEURONTIN) 300 MG capsule Take 300 mg by mouth 2 (two) times daily.   Yes Historical Provider, MD  Homeopathic Products ([employer] LEG CRAMPS PAIN RELIEF PO) Take 1 tablet by mouth daily as needed. Foot cramps.  Yes Historical Provider, MD  nitroGLYCERIN (NITROSTAT) 0.4 MG SL tablet Place 0.4 mg under the tongue every 5 (five) minutes as needed. Chest pain.   Yes Historical Provider, MD  pantoprazole (PROTONIX) 40 MG tablet Take 1 tablet (40 mg total) by mouth daily. 03/25/14  Yes Belkys A Regalado, MD  Potassium 99 MG TABS Take 1 tablet by mouth daily.   Yes Historical Provider, MD   BP 97/65  Pulse 74  Temp(Src) 98.7 F (37.1 C) (Oral)  Resp 16  SpO2 97% Physical Exam  Nursing note and vitals reviewed. Constitutional: She is oriented to  person, place, and time. She appears well-developed and well-nourished.  HENT:  Head: Normocephalic and atraumatic.  Eyes: EOM are normal. Pupils are equal, round, and reactive to light.  Neck: Normal range of motion. Neck supple.  Cardiovascular: Normal rate, regular rhythm and normal heart sounds.   No murmur heard. Pulmonary/Chest: Effort normal and breath sounds normal. No respiratory distress. She has no wheezes. She has no rales. She exhibits tenderness.  Mild tenderness to left anterior chest wall. No crepitance or deformity.  Abdominal: Soft. Bowel sounds are normal. She exhibits no distension. There is no tenderness. There is no rebound and no guarding.  Musculoskeletal: Normal range of motion.  Neurological: She is alert and oriented to person, place, and time. No cranial nerve deficit.  Skin: Skin is warm and dry.  Psychiatric: She has a normal mood and affect. Her speech is normal.    ED Course  Procedures (including critical care time) Labs Review Labs Reviewed  CBC WITH DIFFERENTIAL - Abnormal; Notable for the following:    Hemoglobin 10.4 (*)    HCT 32.1 (*)    MCV 77.9 (*)    MCH 25.2 (*)    RDW 19.0 (*)    All other components within normal limits  BASIC METABOLIC PANEL - Abnormal; Notable for the following:    GFR calc non Af Amer 63 (*)    GFR calc Af Amer 73 (*)    All other components within normal limits  TROPONIN I    Imaging Review Dg Chest 2 View  04/11/2014   CLINICAL DATA:  Worsening chest pain  EXAM: CHEST  2 VIEW  COMPARISON:  5/13/ 15  FINDINGS: Cardiac shadow is mildly enlarged. Thoracic aorta is tortuous but stable. Changes of prior granulomatous disease are noted. No focal confluent infiltrate or sizable effusion is seen. No bony abnormality is noted.  IMPRESSION: Prior granulomatous disease.  No acute abnormality is noted.   Electronically Signed   By: Inez Catalina M.D.   On: 04/11/2014 12:14     EKG Interpretation None      Date:  04/11/2014  Rate: 63  Rhythm: normal sinus rhythm and premature ventricular contractions (PVC)  QRS Axis: normal  Intervals: normal  ST/T Wave abnormalities: nonspecific ST/T changes  Conduction Disutrbances:none  Narrative Interpretation:   Old EKG Reviewed: unchanged   MDM   Final diagnoses:  Chest pain    Patient with chest pain. Episodes of same previously. EKG reassuring except for more frequent PVCs. Enzymes negative. Has had a recent negative stress test as followup with cardiology tomorrow. Will discharge home    Jasper Riling. Alvino Chapel, MD 04/11/14 1537

## 2014-04-11 NOTE — ED Notes (Signed)
Complain of chest pain that started about an hour ago. State the pain is worse when taking a deep breath. Daughter states pt was here about three weeks ago for the same and was admitted

## 2014-04-11 NOTE — ED Notes (Signed)
Patient given discharge instruction, verbalized understand. Patient ambulatory out of the department.  

## 2014-04-11 NOTE — Discharge Instructions (Signed)
Chest Pain (Nonspecific) °It is often hard to give a specific diagnosis for the cause of chest pain. There is always a chance that your pain could be related to something serious, such as a heart attack or a blood clot in the lungs. You need to follow up with your caregiver for further evaluation. °CAUSES  °· Heartburn. °· Pneumonia or bronchitis. °· Anxiety or stress. °· Inflammation around your heart (pericarditis) or lung (pleuritis or pleurisy). °· A blood clot in the lung. °· A collapsed lung (pneumothorax). It can develop suddenly on its own (spontaneous pneumothorax) or from injury (trauma) to the chest. °· Shingles infection (herpes zoster virus). °The chest wall is composed of bones, muscles, and cartilage. Any of these can be the source of the pain. °· The bones can be bruised by injury. °· The muscles or cartilage can be strained by coughing or overwork. °· The cartilage can be affected by inflammation and become sore (costochondritis). °DIAGNOSIS  °Lab tests or other studies, such as X-rays, electrocardiography, stress testing, or cardiac imaging, may be needed to find the cause of your pain.  °TREATMENT  °· Treatment depends on what may be causing your chest pain. Treatment may include: °· Acid blockers for heartburn. °· Anti-inflammatory medicine. °· Pain medicine for inflammatory conditions. °· Antibiotics if an infection is present. °· You may be advised to change lifestyle habits. This includes stopping smoking and avoiding alcohol, caffeine, and chocolate. °· You may be advised to keep your head raised (elevated) when sleeping. This reduces the chance of acid going backward from your stomach into your esophagus. °· Most of the time, nonspecific chest pain will improve within 2 to 3 days with rest and mild pain medicine. °HOME CARE INSTRUCTIONS  °· If antibiotics were prescribed, take your antibiotics as directed. Finish them even if you start to feel better. °· For the next few days, avoid physical  activities that bring on chest pain. Continue physical activities as directed. °· Do not smoke. °· Avoid drinking alcohol. °· Only take over-the-counter or prescription medicine for pain, discomfort, or fever as directed by your caregiver. °· Follow your caregiver's suggestions for further testing if your chest pain does not go away. °· Keep any follow-up appointments you made. If you do not go to an appointment, you could develop lasting (chronic) problems with pain. If there is any problem keeping an appointment, you must call to reschedule. °SEEK MEDICAL CARE IF:  °· You think you are having problems from the medicine you are taking. Read your medicine instructions carefully. °· Your chest pain does not go away, even after treatment. °· You develop a rash with blisters on your chest. °SEEK IMMEDIATE MEDICAL CARE IF:  °· You have increased chest pain or pain that spreads to your arm, neck, jaw, back, or abdomen. °· You develop shortness of breath, an increasing cough, or you are coughing up blood. °· You have severe back or abdominal pain, feel nauseous, or vomit. °· You develop severe weakness, fainting, or chills. °· You have a fever. °THIS IS AN EMERGENCY. Do not wait to see if the pain will go away. Get medical help at once. Call your local emergency services (911 in [place] Do not drive yourself to the hospital. °MAKE SURE YOU:  °· Understand these instructions. °· Will watch your condition. °· Will get help right away if you are not doing well or get worse. °Document Released: 08/07/2005 Document Revised: 01/20/2012 Document Reviewed: 06/02/2008 °[employer]® Patient Information ©2014 [employer]   [employer]. ° °

## 2014-04-12 ENCOUNTER — Encounter: Payer: Self-pay | Admitting: Adult Health

## 2014-04-12 ENCOUNTER — Ambulatory Visit (INDEPENDENT_AMBULATORY_CARE_PROVIDER_SITE_OTHER): Payer: PRIVATE HEALTH INSURANCE | Admitting: Adult Health

## 2014-04-12 VITALS — BP 128/88 | HR 60 | Ht 64.0 in | Wt 143.0 lb

## 2014-04-12 DIAGNOSIS — I359 Nonrheumatic aortic valve disorder, unspecified: Secondary | ICD-10-CM

## 2014-04-12 DIAGNOSIS — I10 Essential (primary) hypertension: Secondary | ICD-10-CM

## 2014-04-12 DIAGNOSIS — K219 Gastro-esophageal reflux disease without esophagitis: Secondary | ICD-10-CM

## 2014-04-12 DIAGNOSIS — I2510 Atherosclerotic heart disease of native coronary artery without angina pectoris: Secondary | ICD-10-CM

## 2014-04-12 NOTE — Patient Instructions (Signed)
Your physician wants you to follow-up in: 1 year You will receive a reminder letter in the mail two months in advance. If you don't receive a letter, please call our office to schedule the follow-up appointment.    Your physician recommends that you continue on your current medications as directed. Please refer to the Current Medication list given to you today.     Thank you for choosing [facility] !  

## 2014-04-12 NOTE — Assessment & Plan Note (Signed)
Recommend GI evaluation for further treatment and recommendations for abdominal distention and gas. Can take OTC anti-gas medications.

## 2014-04-12 NOTE — Assessment & Plan Note (Signed)
Will continue current medication with medical management with statin, ASA. No further testing is planned at this time. See her in one year.

## 2014-04-12 NOTE — Assessment & Plan Note (Signed)
Follow up in one year unless symptomatic. Echo if this occurs, otherwise repeat in 2 years.

## 2014-04-12 NOTE — Assessment & Plan Note (Signed)
Excellent control of BP on current medication regimen. Will not make any changes. She will follow with PCP. Will see her in a year unless new symptoms occur.,

## 2014-04-12 NOTE — Progress Notes (Deleted)
Name: Evelyn Baird    DOB: Jul 04, 1930  Age: 80 y.o.  MR#: [id number]       PCP:  Fredirick Maudlin, MD      Insurance: Payor: Onnie Boer MEDICARE / Plan: Cottie Banda / Product Type: *No Product type* /   CC:    Chief Complaint  Patient presents with  . Chest Pain  . Hypertension    VS Filed Vitals:   04/12/14 1318  BP: 128/88  Pulse: 60  Height: 5\' 4"  (1.626 m)  Weight: 143 lb (64.864 kg)  SpO2: 97%    Weights Current Weight  04/12/14 143 lb (64.864 kg)  03/23/14 147 lb 4.3 oz (66.8 kg)  12/06/13 143 lb (64.864 kg)    Blood Pressure  BP Readings from Last 3 Encounters:  04/12/14 128/88  04/11/14 97/65  03/25/14 140/60     Admit date:  (Not on file) Last encounter with RMR:  Visit date not found   Allergy Beef-derived products and Pork-derived products  Current Outpatient Prescriptions  Medication Sig Dispense Refill  . aspirin EC 81 MG tablet Take 81 mg by mouth every morning.       Marland Kitchen atorvastatin (LIPITOR) 40 MG tablet Take 1 tablet (40 mg total) by mouth daily at 6 PM.  30 tablet  0  . Calcium Carb-Cholecalciferol (CALCIUM 600 + D) 600-200 MG-UNIT TABS Take 1 tablet by mouth 2 (two) times daily.      . diphenhydrAMINE (BENADRYL) 25 MG tablet Take 25 mg by mouth every 6 (six) hours as needed. Itching.      . ferrous sulfate 325 (65 FE) MG tablet Take 1 tablet (325 mg total) by mouth 2 (two) times daily with a meal.  30 tablet  3  . gabapentin (NEURONTIN) 300 MG capsule Take 300 mg by mouth 2 (two) times daily.      . Homeopathic Products ([employer] LEG CRAMPS PAIN RELIEF PO) Take 1 tablet by mouth daily as needed. Foot cramps.      Marland Kitchen lisinopril (PRINIVIL,ZESTRIL) 20 MG tablet       . nitroGLYCERIN (NITROSTAT) 0.4 MG SL tablet Place 0.4 mg under the tongue every 5 (five) minutes as needed. Chest pain.      Marland Kitchen Potassium 99 MG TABS Take 1 tablet by mouth daily.       No current facility-administered medications for this visit.    Discontinued Meds:    Medications  Discontinued During This Encounter  Medication Reason  . pantoprazole (PROTONIX) 40 MG tablet Error    Patient Active Problem List   Diagnosis Date Noted  . Chest pain 03/23/2014  . Anemia 03/23/2014  . Unspecified hereditary and idiopathic peripheral neuropathy 12/06/2013  . Preoperative evaluation to rule out surgical contraindication 01/05/2013  . Aortic valve disorders 04/03/2012  . HYPERLIPIDEMIA 10/15/2006  . Essential hypertension, benign 10/15/2006  . Coronary atherosclerosis of native coronary artery 10/15/2006  . GERD 10/15/2006    LABS    Component Value Date/Time   NA 142 04/11/2014 1115   NA 140 03/25/2014 1310   NA 143 03/24/2014 0408   K 4.2 04/11/2014 1115   K 4.0 03/25/2014 1310   K 3.6* 03/24/2014 0408   CL 103 04/11/2014 1115   CL 102 03/25/2014 1310   CL 107 03/24/2014 0408   CO2 25 04/11/2014 1115   CO2 25 03/25/2014 1310   CO2 25 03/24/2014 0408   GLUCOSE 83 04/11/2014 1115   GLUCOSE 169* 03/25/2014 1310   GLUCOSE 91 03/24/2014  0408   BUN 15 04/11/2014 1115   BUN 15 03/25/2014 1310   BUN 16 03/24/2014 0408   CREATININE 0.84 04/11/2014 1115   CREATININE 0.80 03/25/2014 1310   CREATININE 0.80 03/24/2014 0408   CALCIUM 9.3 04/11/2014 1115   CALCIUM 9.6 03/25/2014 1310   CALCIUM 8.8 03/24/2014 0408   GFRNONAA 63* 04/11/2014 1115   GFRNONAA 67* 03/25/2014 1310   GFRNONAA 67* 03/24/2014 0408   GFRAA 73* 04/11/2014 1115   GFRAA 77* 03/25/2014 1310   GFRAA 77* 03/24/2014 0408   CMP     Component Value Date/Time   NA 142 04/11/2014 1115   K 4.2 04/11/2014 1115   CL 103 04/11/2014 1115   CO2 25 04/11/2014 1115   GLUCOSE 83 04/11/2014 1115   BUN 15 04/11/2014 1115   CREATININE 0.84 04/11/2014 1115   CALCIUM 9.3 04/11/2014 1115   PROT 6.1 03/24/2014 0408   ALBUMIN 3.2* 03/24/2014 0408   AST 18 03/24/2014 0408   ALT 8 03/24/2014 0408   ALKPHOS 89 03/24/2014 0408   BILITOT 0.3 03/24/2014 0408   GFRNONAA 63* 04/11/2014 1115   GFRAA 73* 04/11/2014 1115       Component Value Date/Time   WBC 4.7  04/11/2014 1115   WBC 3.8* 03/24/2014 0408   WBC 4.3 03/23/2014 2048   HGB 10.4* 04/11/2014 1115   HGB 8.8* 03/24/2014 0408   HGB 9.0* 03/23/2014 2048   HCT 32.1* 04/11/2014 1115   HCT 26.7* 03/24/2014 0408   HCT 27.6* 03/23/2014 2048   MCV 77.9* 04/11/2014 1115   MCV 74.8* 03/24/2014 0408   MCV 75.0* 03/23/2014 2048    Lipid Panel     Component Value Date/Time   CHOL 232* 03/25/2014 0525   TRIG 89 03/25/2014 0525   HDL 80 03/25/2014 0525   CHOLHDL 2.9 03/25/2014 0525   VLDL 18 03/25/2014 0525   LDLCALC 134* 03/25/2014 0525    ABG No results found for this basename: phart, pco2, pco2art, po2, po2art, hco3, tco2, acidbasedef, o2sat     Lab Results  Component Value Date   TSH 2.590 03/23/2014   BNP (last 3 results) No results found for this basename: PROBNP,  in the last 8760 hours Cardiac Panel (last 3 results)  Recent Labs  04/11/14 1115  TROPONINI <0.30    Iron/TIBC/Ferritin    Component Value Date/Time   IRON 39* 03/24/2014 0408   TIBC 335 03/24/2014 0408   FERRITIN 11 03/24/2014 0408     EKG Orders placed during the hospital encounter of 04/11/14  . EKG 12-LEAD  . EKG 12-LEAD  . EKG 12-LEAD  . EKG 12-LEAD     Prior Assessment and Plan Problem List as of 04/12/2014     Cardiovascular and Mediastinum   Essential hypertension, benign   Last Assessment & Plan   01/05/2013 Office Visit Written 01/05/2013  2:06 PM by Satira Sark, MD     Blood pressure normal today. No changes made.    Coronary atherosclerosis of native coronary artery   Last Assessment & Plan   01/05/2013 Office Visit Written 01/05/2013  2:05 PM by Satira Sark, MD     Mild to moderate, nonobstructive disease. Continue medical therapy and observation.    Aortic valve disorders   Last Assessment & Plan   04/03/2012 Office Visit Written 04/03/2012  9:58 AM by Satira Sark, MD     Aortic murmur with sclerosis noted by echocardiogram in 2011. Plan followup echocardiogram for reassessment. At that time  LV function was normal.      Digestive   GERD     Nervous and Auditory   Unspecified hereditary and idiopathic peripheral neuropathy     Other   HYPERLIPIDEMIA   Last Assessment & Plan   04/03/2012 Office Visit Written 04/03/2012  9:57 AM by Satira Sark, MD     Recent lipid numbers reviewed, on Lipitor. Would ideally aim for LDL under 100.    Preoperative evaluation to rule out surgical contraindication   Last Assessment & Plan   01/05/2013 Office Visit Written 01/05/2013  2:05 PM by Satira Sark, MD     Patient to undergo cataract extraction on 3/6 at [facility]. From a cardiac perspective she has been clinically stable, previous history of nonobstructive CAD. Do not anticipate any further cardiac testing at this time. She should be able to proceed with an acceptable perioperative cardiovascular risk, overall low.    Chest pain   Anemia       Imaging: Dg Chest 2 View  04/11/2014   CLINICAL DATA:  Worsening chest pain  EXAM: CHEST  2 VIEW  COMPARISON:  5/13/ 15  FINDINGS: Cardiac shadow is mildly enlarged. Thoracic aorta is tortuous but stable. Changes of prior granulomatous disease are noted. No focal confluent infiltrate or sizable effusion is seen. No bony abnormality is noted.  IMPRESSION: Prior granulomatous disease.  No acute abnormality is noted.   Electronically Signed   By: Inez Catalina M.D.   On: 04/11/2014 12:14   Dg Chest 2 View  03/23/2014   CLINICAL DATA:  Chest pain, hypertension  EXAM: CHEST  2 VIEW  COMPARISON:  11/07/2012  FINDINGS: Stable cardiomegaly without CHF or pneumonia. Calcific atherosclerosis of the ectatic and tortuous thoracic aorta. Remote granulomatous disease in the upper lobes bilaterally. There is no superimposed consolidation, collapse, effusion, or pneumothorax. Trachea midline. Degenerative changes of the spine. Overall stable exam.  IMPRESSION: Cardiomegaly without acute process  Thoracic atherosclerosis and tortuosity  Remote  granulomatous disease  No interval change or acute process   Electronically Signed   By: Daryll Brod M.D.   On: 03/23/2014 21:11   Nm Myocar Single W/spect W/wall Motion And Ef  03/25/2014   CLINICAL DATA:  80 year old female with no known history of coronary artery disease referred for chest pain.  EXAM: MYOCARDIAL IMAGING WITH SPECT (REST AND PHARMACOLOGIC-STRESS)  GATED LEFT VENTRICULAR WALL MOTION STUDY  LEFT VENTRICULAR EJECTION FRACTION  TECHNIQUE: Standard myocardial SPECT imaging was performed after resting intravenous injection of 10 mCi Tc-72m sestamibi. Subsequently, intravenous infusion of Lexiscan was performed under the supervision of the Cardiology staff. At peak effect of the drug, 30 mCi Tc-51m sestamibi was injected intravenously and standard myocardial SPECT imaging was performed. Quantitative gated imaging was also performed to evaluate left ventricular wall motion, and estimate left ventricular ejection fraction.  COMPARISON:  None.  FINDINGS: Pharmacological stress  Baseline EKG showed normal sinus rhythm with nonspecific ST/T changes. After injection the resting heart rate increased from 72 beats per min up to 115 beats per min and blood pressure decreased from 120/70 down to 97/54. The test was stopped after injection was complete, the patient did not experience any chest pain.  Post-injection EKG showed no specific ischemic changes and no significant arrhythmias.  Myocardial perfusion imaging  Raw images showed appropriate radiotracer uptake. There was a small apical defect seen in the resting images, this defect was less intense in the post-injection images. The apex had normal wall motion,  overall findings consistent with apical thinning. There was a moderate-sized inferior wall defect seen at rest, this defect was less intense in the post-injection images. The inferior wall had normal wall motion, overall findings consistent with sub- diaphragmatic attenuation. There were no other  myocardial perfusion defects.  Gated imaging showed end-diastolic volume 78 mL, and systolic volume 34 mL, left ventricular ejection fraction 56%, TID 1.10. Wall motion was normal.  IMPRESSION: 1.  Negative Lexiscan MPI for ischemia  2.  Normal left ventricular systolic function, LVEF 61%  3.  Low risk study for major cardiac events   Electronically Signed   By: Carlyle Dolly   On: 03/25/2014 12:14

## 2014-04-26 ENCOUNTER — Emergency Department (HOSPITAL_COMMUNITY)
Admission: EM | Admit: 2014-04-26 | Discharge: 2014-04-26 | Disposition: A | Discharge status: Home / Self Care | Payer: PRIVATE HEALTH INSURANCE | Attending: Emergency Medicine | Admitting: Emergency Medicine

## 2014-04-26 ENCOUNTER — Emergency Department (HOSPITAL_COMMUNITY): Payer: PRIVATE HEALTH INSURANCE

## 2014-04-26 ENCOUNTER — Encounter (HOSPITAL_COMMUNITY): Payer: Self-pay | Admitting: Emergency Medicine

## 2014-04-26 DIAGNOSIS — Z79899 Other long term (current) drug therapy: Secondary | ICD-10-CM | POA: Insufficient documentation

## 2014-04-26 DIAGNOSIS — G608 Other hereditary and idiopathic neuropathies: Secondary | ICD-10-CM | POA: Insufficient documentation

## 2014-04-26 DIAGNOSIS — I2510 Atherosclerotic heart disease of native coronary artery without angina pectoris: Secondary | ICD-10-CM | POA: Diagnosis not present

## 2014-04-26 DIAGNOSIS — R1084 Generalized abdominal pain: Secondary | ICD-10-CM | POA: Diagnosis present

## 2014-04-26 DIAGNOSIS — I10 Essential (primary) hypertension: Secondary | ICD-10-CM | POA: Diagnosis not present

## 2014-04-26 DIAGNOSIS — I4949 Other premature depolarization: Secondary | ICD-10-CM | POA: Diagnosis not present

## 2014-04-26 DIAGNOSIS — R109 Unspecified abdominal pain: Secondary | ICD-10-CM

## 2014-04-26 DIAGNOSIS — Z7982 Long term (current) use of aspirin: Secondary | ICD-10-CM | POA: Insufficient documentation

## 2014-04-26 DIAGNOSIS — E785 Hyperlipidemia, unspecified: Secondary | ICD-10-CM | POA: Diagnosis not present

## 2014-04-26 LAB — CBC WITH DIFFERENTIAL/PLATELET
Basophils Absolute: 0 10*3/uL (ref 0.0–0.1)
Basophils Relative: 0 % (ref 0–1)
EOS ABS: 0.1 10*3/uL (ref 0.0–0.7)
EOS PCT: 2 % (ref 0–5)
HCT: 35.9 % — ABNORMAL LOW (ref 36.0–46.0)
Hemoglobin: 11.7 g/dL — ABNORMAL LOW (ref 12.0–15.0)
LYMPHS ABS: 2 10*3/uL (ref 0.7–4.0)
Lymphocytes Relative: 41 % (ref 12–46)
MCH: 25.8 pg — ABNORMAL LOW (ref 26.0–34.0)
MCHC: 32.6 g/dL (ref 30.0–36.0)
MCV: 79.2 fL (ref 78.0–100.0)
Monocytes Absolute: 0.3 10*3/uL (ref 0.1–1.0)
Monocytes Relative: 6 % (ref 3–12)
Neutro Abs: 2.5 10*3/uL (ref 1.7–7.7)
Neutrophils Relative %: 51 % (ref 43–77)
PLATELETS: 184 10*3/uL (ref 150–400)
RBC: 4.53 MIL/uL (ref 3.87–5.11)
RDW: 19.4 % — AB (ref 11.5–15.5)
WBC: 4.9 10*3/uL (ref 4.0–10.5)

## 2014-04-26 LAB — COMPREHENSIVE METABOLIC PANEL
ALT: 14 U/L (ref 0–35)
AST: 21 U/L (ref 0–37)
Albumin: 4.1 g/dL (ref 3.5–5.2)
Alkaline Phosphatase: 107 U/L (ref 39–117)
BUN: 12 mg/dL (ref 6–23)
CALCIUM: 9.7 mg/dL (ref 8.4–10.5)
CO2: 27 mEq/L (ref 19–32)
Chloride: 105 mEq/L (ref 96–112)
Creatinine, Ser: 0.84 mg/dL (ref 0.50–1.10)
GFR calc Af Amer: 73 mL/min — ABNORMAL LOW (ref >90)
GFR calc non Af Amer: 63 mL/min — ABNORMAL LOW (ref >90)
Glucose, Bld: 89 mg/dL (ref 70–99)
Potassium: 4.2 mEq/L (ref 3.7–5.3)
SODIUM: 144 meq/L (ref 137–147)
TOTAL PROTEIN: 7.9 g/dL (ref 6.0–8.3)
Total Bilirubin: 0.2 mg/dL — ABNORMAL LOW (ref 0.3–1.2)

## 2014-04-26 LAB — URINALYSIS, ROUTINE W REFLEX MICROSCOPIC
BILIRUBIN URINE: NEGATIVE
Glucose, UA: NEGATIVE mg/dL
Hgb urine dipstick: NEGATIVE
Ketones, ur: NEGATIVE mg/dL
Leukocytes, UA: NEGATIVE
Nitrite: NEGATIVE
PH: 6 (ref 5.0–8.0)
Protein, ur: NEGATIVE mg/dL
SPECIFIC GRAVITY, URINE: 1.01 (ref 1.005–1.030)
Urobilinogen, UA: 0.2 mg/dL (ref 0.0–1.0)

## 2014-04-26 NOTE — ED Provider Notes (Signed)
CSN: [id number]     Arrival date & time 04/26/14  1024 History  This chart was scribed for Maudry Diego, MD by Roe Coombs, ED Scribe. The patient was seen in room APA18/APA18. Patient's care was started at 12:11 PM.  Chief Complaint  Patient presents with  . Abdominal Pain    Patient is a 80 y.o. female presenting with abdominal pain. The history is provided by the patient. No language interpreter was used.  Abdominal Pain Pain location:  Generalized Pain severity:  Mild Duration:  4 weeks Timing:  Intermittent Progression:  Partially resolved Associated symptoms: no chest pain, no cough, no diarrhea, no fatigue and no hematuria   Risk factors: being elderly    HPI Comments: ARVA SLAUGH is a 80 y.o. female who presents to the Emergency Department complaining of intermittent, mild generalized abdominal discomfort beginning one month ago. She reports that she feels as though something is moving around in her stomach. Patient states that two weeks ago, she started taking oral iron supplements, and when she stopped yesterday, her abdominal discomfort improved mildly. She denies nausea, vomiting, diarrhea, fever, or chills.   Past Medical History  Diagnosis Date  . Essential hypertension, benign   . Hyperlipidemia   . Coronary atherosclerosis of native coronary artery     Nonobstructive 2009 - previously followed by [facility]  . Peripheral neuropathy   . Prediabetes   . PVC's (premature ventricular contractions)    Past Surgical History  Procedure Laterality Date  . Left abdominal hernia repair    . Hernia repair     Family History  Problem Relation Age of Onset  . Hypertension Father   . Kidney disease Father    History  Substance Use Topics  . Smoking status: Never Smoker   . Smokeless tobacco: Never Used  . Alcohol Use: No   OB History   Grav Para Term Preterm Abortions TAB SAB Ect Mult Living                 Review of Systems  Constitutional: Negative for appetite  change and fatigue.  HENT: Negative for congestion, ear discharge and sinus pressure.   Eyes: Negative for discharge.  Respiratory: Negative for cough.   Cardiovascular: Negative for chest pain.  Gastrointestinal: Positive for abdominal pain. Negative for diarrhea.  Genitourinary: Negative for frequency and hematuria.  Musculoskeletal: Negative for back pain.  Skin: Negative for rash.  Neurological: Negative for seizures and headaches.  Psychiatric/Behavioral: Negative for hallucinations.     Allergies  Beef-derived products and Pork-derived products  Home Medications   Prior to Admission medications   Medication Sig Start Date End Date Taking? Authorizing Provider  aspirin EC 81 MG tablet Take 81 mg by mouth every morning.    Yes Historical Provider, MD  atorvastatin (LIPITOR) 40 MG tablet Take 1 tablet (40 mg total) by mouth daily at 6 PM. 03/25/14  Yes Belkys A Regalado, MD  Calcium Carb-Cholecalciferol (CALCIUM 600 + D) 600-200 MG-UNIT TABS Take 1 tablet by mouth 2 (two) times daily.   Yes Historical Provider, MD  diphenhydrAMINE (BENADRYL) 25 MG tablet Take 25 mg by mouth every 6 (six) hours as needed. Itching.   Yes Historical Provider, MD  ferrous sulfate 325 (65 FE) MG tablet Take 1 tablet (325 mg total) by mouth 2 (two) times daily with a meal. 03/25/14  Yes Belkys A Regalado, MD  gabapentin (NEURONTIN) 300 MG capsule Take 300 mg by mouth 2 (two) times daily.  Yes Historical Provider, MD  Homeopathic Products ([employer] LEG CRAMPS PAIN RELIEF PO) Take 1 tablet by mouth daily as needed. Foot cramps.   Yes Historical Provider, MD  lisinopril (PRINIVIL,ZESTRIL) 20 MG tablet Take 20 mg by mouth daily.  03/17/14  Yes Historical Provider, MD  nitroGLYCERIN (NITROSTAT) 0.4 MG SL tablet Place 0.4 mg under the tongue every 5 (five) minutes as needed. Chest pain.   Yes Historical Provider, MD  Potassium 99 MG TABS Take 1 tablet by mouth daily.   Yes Historical Provider, MD   Triage Vitals: BP  153/94  Pulse 70  Temp(Src) 99.5 F (37.5 C) (Oral)  Resp 19  SpO2 99% Physical Exam  Constitutional: She is oriented to person, place, and time. She appears well-developed.  HENT:  Head: Normocephalic.  Eyes: Conjunctivae and EOM are normal. No scleral icterus.  Neck: Neck supple. No thyromegaly present.  Cardiovascular: Normal rate and regular rhythm.  Exam reveals no gallop and no friction rub.   No murmur heard. Pulmonary/Chest: No stridor. She has no wheezes. She has no rales. She exhibits no tenderness.  Abdominal: She exhibits no distension. There is no tenderness. There is no rebound.  Musculoskeletal: Normal range of motion. She exhibits no edema.  Lymphadenopathy:    She has no cervical adenopathy.  Neurological: She is oriented to person, place, and time. She exhibits normal muscle tone. Coordination normal.  Skin: No rash noted. No erythema.  Psychiatric: She has a normal mood and affect. Her behavior is normal.    ED Course  Procedures (including critical care time) DIAGNOSTIC STUDIES: Oxygen Saturation is 99% on room air, normal by my interpretation.    COORDINATION OF CARE: 12:14 PM- Will order CBC with diff, CMP, UA. Patient informed of current plan for treatment and evaluation and agrees with plan at this time.     Labs Review Labs Reviewed - No data to display  Imaging Review No results found.   EKG Interpretation None      MDM   Final diagnoses:  None    The chart was scribed for me under my direct supervision.  I personally performed the history, physical, and medical decision making and all procedures in the evaluation of this patient.Maudry Diego, MD 04/26/14 [phone]

## 2014-04-26 NOTE — Discharge Instructions (Signed)
Follow up with your md this week for recheck  °

## 2014-04-26 NOTE — ED Notes (Signed)
Pt c/o all over abd fullness. Pt explains it like" feels big like im having a baby". States started after taking iron pills 4 days ago. Pt states the pain "moves about" the whole abd. Denies n/v/d. Nad. Alert/oriented. Mm wet.

## 2015-02-27 ENCOUNTER — Encounter (HOSPITAL_COMMUNITY): Payer: Self-pay | Admitting: Emergency Medicine

## 2015-02-27 ENCOUNTER — Emergency Department (HOSPITAL_COMMUNITY)
Admission: EM | Admit: 2015-02-27 | Discharge: 2015-02-27 | Disposition: A | Discharge status: Home / Self Care | Payer: Medicare Other | Attending: Emergency Medicine | Admitting: Emergency Medicine

## 2015-02-27 ENCOUNTER — Emergency Department (HOSPITAL_COMMUNITY): Payer: Medicare Other

## 2015-02-27 DIAGNOSIS — R1310 Dysphagia, unspecified: Secondary | ICD-10-CM | POA: Insufficient documentation

## 2015-02-27 DIAGNOSIS — Z7982 Long term (current) use of aspirin: Secondary | ICD-10-CM | POA: Diagnosis not present

## 2015-02-27 DIAGNOSIS — Z8669 Personal history of other diseases of the nervous system and sense organs: Secondary | ICD-10-CM | POA: Insufficient documentation

## 2015-02-27 DIAGNOSIS — E119 Type 2 diabetes mellitus without complications: Secondary | ICD-10-CM | POA: Diagnosis not present

## 2015-02-27 DIAGNOSIS — Z79899 Other long term (current) drug therapy: Secondary | ICD-10-CM | POA: Insufficient documentation

## 2015-02-27 DIAGNOSIS — K2100 Gastro-esophageal reflux disease with esophagitis, without bleeding: Secondary | ICD-10-CM

## 2015-02-27 DIAGNOSIS — Z9889 Other specified postprocedural states: Secondary | ICD-10-CM | POA: Insufficient documentation

## 2015-02-27 DIAGNOSIS — R42 Dizziness and giddiness: Secondary | ICD-10-CM | POA: Diagnosis present

## 2015-02-27 DIAGNOSIS — I2510 Atherosclerotic heart disease of native coronary artery without angina pectoris: Secondary | ICD-10-CM | POA: Insufficient documentation

## 2015-02-27 DIAGNOSIS — E785 Hyperlipidemia, unspecified: Secondary | ICD-10-CM | POA: Insufficient documentation

## 2015-02-27 LAB — CBC WITH DIFFERENTIAL/PLATELET
Basophils Absolute: 0 10*3/uL (ref 0.0–0.1)
Basophils Relative: 0 % (ref 0–1)
EOS PCT: 1 % (ref 0–5)
Eosinophils Absolute: 0 10*3/uL (ref 0.0–0.7)
HEMATOCRIT: 34.1 % — AB (ref 36.0–46.0)
Hemoglobin: 11 g/dL — ABNORMAL LOW (ref 12.0–15.0)
Lymphocytes Relative: 33 % (ref 12–46)
Lymphs Abs: 1.4 10*3/uL (ref 0.7–4.0)
MCH: 26.4 pg (ref 26.0–34.0)
MCHC: 32.3 g/dL (ref 30.0–36.0)
MCV: 82 fL (ref 78.0–100.0)
MONO ABS: 0.3 10*3/uL (ref 0.1–1.0)
Monocytes Relative: 6 % (ref 3–12)
Neutro Abs: 2.5 10*3/uL (ref 1.7–7.7)
Neutrophils Relative %: 60 % (ref 43–77)
Platelets: 181 10*3/uL (ref 150–400)
RBC: 4.16 MIL/uL (ref 3.87–5.11)
RDW: 15.7 % — ABNORMAL HIGH (ref 11.5–15.5)
WBC: 4.2 10*3/uL (ref 4.0–10.5)

## 2015-02-27 LAB — BASIC METABOLIC PANEL
Anion gap: 7 (ref 5–15)
BUN: 18 mg/dL (ref 6–23)
CO2: 27 mmol/L (ref 19–32)
CREATININE: 0.73 mg/dL (ref 0.50–1.10)
Calcium: 9.2 mg/dL (ref 8.4–10.5)
Chloride: 107 mmol/L (ref 96–112)
GFR calc Af Amer: 89 mL/min — ABNORMAL LOW (ref >90)
GFR calc non Af Amer: 77 mL/min — ABNORMAL LOW (ref >90)
Glucose, Bld: 109 mg/dL — ABNORMAL HIGH (ref 70–99)
Potassium: 4.4 mmol/L (ref 3.5–5.1)
Sodium: 141 mmol/L (ref 135–145)

## 2015-02-27 MED ORDER — ESOMEPRAZOLE MAGNESIUM 40 MG PO CPDR: 40.0000 mg | DELAYED_RELEASE_CAPSULE | Freq: Every day | ORAL | Status: DC

## 2015-02-27 MED ORDER — IOHEXOL 300 MG/ML  SOLN
75.0000 mL | Freq: Once | INTRAMUSCULAR | Status: AC | PRN
  Administered 2015-02-27: 75 mL via INTRAVENOUS

## 2015-02-27 NOTE — Discharge Instructions (Signed)
Stop Prilosec New Rx:  Protonix Recheck with PCP.  Dysphagia Swallowing problems (dysphagia) occur when solids and liquids seem to stick in your throat on the way down to your stomach, or the food takes longer to get to the stomach. Other symptoms include regurgitating food, noises coming from the throat, chest discomfort with swallowing, and a feeling of fullness or the feeling of something being stuck in your throat when swallowing. When blockage in your throat is complete, it may be associated with drooling. CAUSES  Problems with swallowing may occur because of problems with the muscles. The food cannot be propelled in the usual manner into your stomach. You may have ulcers, scar tissue, or inflammation in the tube down which food travels from your mouth to your stomach (esophagus), which blocks food from passing normally into the stomach. Causes of inflammation include:  Acid reflux from your stomach into your esophagus.  Infection.  Radiation treatment for cancer.  Medicines taken without enough fluids to wash them down into your stomach. You may have nerve problems that prevent signals from being sent to the muscles of your esophagus to contract and move your food down to your stomach. Globus pharyngeus is a relatively common problem in which there is a sense of an obstruction or difficulty in swallowing, without any physical abnormalities of the swallowing passages being found. This problem usually improves over time with reassurance and testing to rule out other causes. DIAGNOSIS Dysphagia can be diagnosed and its cause can be determined by tests in which you swallow a white substance that helps illuminate the inside of your throat (contrast medium) while X-rays are taken. Sometimes a flexible telescope that is inserted down your throat (endoscopy) to look at your esophagus and stomach is used. TREATMENT   If the dysphagia is caused by acid reflux or infection, medicines may be  used.  If the dysphagia is caused by problems with your swallowing muscles, swallowing therapy may be used to help you strengthen your swallowing muscles.  If the dysphagia is caused by a blockage or mass, procedures to remove the blockage may be done. HOME CARE INSTRUCTIONS  Try to eat soft food that is easier to swallow and check your weight on a daily basis to be sure that it is not decreasing.  Be sure to drink liquids when sitting upright (not lying down). SEEK MEDICAL CARE IF:  You are losing weight because you are unable to swallow.  You are coughing when you drink liquids (aspiration).  You are coughing up partially digested food. SEEK IMMEDIATE MEDICAL CARE IF:  You are unable to swallow your own saliva .  You are having shortness of breath or a fever, or both.  You have a hoarse voice along with difficulty swallowing. MAKE SURE YOU:  Understand these instructions.  Will watch your condition.  Will get help right away if you are not doing well or get worse. Document Released: 10/25/2000 Document Revised: 03/14/2014 Document Reviewed: 04/16/2013 [facility] Patient Information 2015 [place], [place]. This information is not intended to replace advice given to you by your health care provider. Make sure you discuss any questions you have with your health care provider.  Gastroesophageal Reflux Disease, Adult Gastroesophageal reflux disease (GERD) happens when acid from your stomach flows up into the esophagus. When acid comes in contact with the esophagus, the acid causes soreness (inflammation) in the esophagus. Over time, GERD may create small holes (ulcers) in the lining of the esophagus. CAUSES   Increased body weight.  This puts pressure on the stomach, making acid rise from the stomach into the esophagus.  Smoking. This increases acid production in the stomach.  Drinking alcohol. This causes decreased pressure in the lower esophageal sphincter (valve or ring of muscle  between the esophagus and stomach), allowing acid from the stomach into the esophagus.  Late evening meals and a full stomach. This increases pressure and acid production in the stomach.  A malformed lower esophageal sphincter. Sometimes, no cause is found. SYMPTOMS   Burning pain in the lower part of the mid-chest behind the breastbone and in the mid-stomach area. This may occur twice a week or more often.  Trouble swallowing.  Sore throat.  Dry cough.  Asthma-like symptoms including chest tightness, shortness of breath, or wheezing. DIAGNOSIS  Your caregiver may be able to diagnose GERD based on your symptoms. In some cases, X-rays and other tests may be done to check for complications or to check the condition of your stomach and esophagus. TREATMENT  Your caregiver may recommend over-the-counter or prescription medicines to help decrease acid production. Ask your caregiver before starting or adding any new medicines.  HOME CARE INSTRUCTIONS   Change the factors that you can control. Ask your caregiver for guidance concerning weight loss, quitting smoking, and alcohol consumption.  Avoid foods and drinks that make your symptoms worse, such as:  Caffeine or alcoholic drinks.  Chocolate.  Peppermint or mint flavorings.  Garlic and onions.  Spicy foods.  Citrus fruits, such as oranges, lemons, or limes.  Tomato-based foods such as sauce, chili, salsa, and pizza.  Fried and fatty foods.  Avoid lying down for the 3 hours prior to your bedtime or prior to taking a nap.  Eat small, frequent meals instead of large meals.  Wear loose-fitting clothing. Do not wear anything tight around your waist that causes pressure on your stomach.  Raise the head of your bed 6 to 8 inches with wood blocks to help you sleep. Extra pillows will not help.  Only take over-the-counter or prescription medicines for pain, discomfort, or fever as directed by your caregiver.  Do not take  aspirin, ibuprofen, or other nonsteroidal anti-inflammatory drugs (NSAIDs). SEEK IMMEDIATE MEDICAL CARE IF:   You have pain in your arms, neck, jaw, teeth, or back.  Your pain increases or changes in intensity or duration.  You develop nausea, vomiting, or sweating (diaphoresis).  You develop shortness of breath, or you faint.  Your vomit is green, yellow, black, or looks like coffee grounds or blood.  Your stool is red, bloody, or black. These symptoms could be signs of other problems, such as heart disease, gastric bleeding, or esophageal bleeding. MAKE SURE YOU:   Understand these instructions.  Will watch your condition.  Will get help right away if you are not doing well or get worse. Document Released: 08/07/2005 Document Revised: 01/20/2012 Document Reviewed: 05/17/2011 [facility] Patient Information 2015 [place], [place]. This information is not intended to replace advice given to you by your health care provider. Make sure you discuss any questions you have with your health care provider.  Esophagitis Esophagitis is inflammation of the esophagus. It can involve swelling, soreness, and pain in the esophagus. This condition can make it difficult and painful to swallow. CAUSES  Most causes of esophagitis are not serious. Many different factors can cause esophagitis, including:  Gastroesophageal reflux disease (GERD). This is when acid from your stomach flows up into the esophagus.  Recurrent vomiting.  An allergic-type reaction.  Certain  medicines, especially those that come in large pills.  Ingestion of harmful chemicals, such as household cleaning products.  Heavy alcohol use.  An infection of the esophagus.  Radiation treatment for cancer.  Certain diseases such as sarcoidosis, Crohn's disease, and scleroderma. These diseases may cause recurrent esophagitis. SYMPTOMS   Trouble swallowing.  Painful swallowing.  Chest pain.  Difficulty  breathing.  Nausea.  Vomiting.  Abdominal pain. DIAGNOSIS  Your caregiver will take your history and do a physical exam. Depending upon what your caregiver finds, certain tests may also be done, including:  Barium X-ray. You will drink a solution that coats the esophagus, and X-rays will be taken.  Endoscopy. A lighted tube is put down the esophagus so your caregiver can examine the area.  Allergy tests. These can sometimes be arranged through follow-up visits. TREATMENT  Treatment will depend on the cause of your esophagitis. In some cases, steroids or other medicines may be given to help relieve your symptoms or to treat the underlying cause of your condition. Medicines that may be recommended include:  Viscous lidocaine, to soothe the esophagus.  Antacids.  Acid reducers.  Proton pump inhibitors.  Antiviral medicines for certain viral infections of the esophagus.  Antifungal medicines for certain fungal infections of the esophagus.  Antibiotic medicines, depending on the cause of the esophagitis. HOME CARE INSTRUCTIONS   Avoid foods and drinks that seem to make your symptoms worse.  Eat small, frequent meals instead of large meals.  Avoid eating for the 3 hours prior to your bedtime.  If you have trouble taking pills, use a pill splitter to decrease the size and likelihood of the pill getting stuck or injuring the esophagus on the way down. Drinking water after taking a pill also helps.  Stop smoking if you smoke.  Maintain a healthy weight.  Wear loose-fitting clothing. Do not wear anything tight around your waist that causes pressure on your stomach.  Raise the head of your bed 6 to 8 inches with wood blocks to help you sleep. Extra pillows will not help.  Only take over-the-counter or prescription medicines as directed by your caregiver. SEEK IMMEDIATE MEDICAL CARE IF:  You have severe chest pain that radiates into your arm, neck, or jaw.  You feel sweaty,  dizzy, or lightheaded.  You have shortness of breath.  You vomit blood.  You have difficulty or pain with swallowing.  You have bloody or black, tarry stools.  You have a fever.  You have a burning sensation in the chest more than 3 times a week for more than 2 weeks.  You cannot swallow, drink, or eat.  You drool because you cannot swallow your saliva. MAKE SURE YOU:  Understand these instructions.  Will watch your condition.  Will get help right away if you are not doing well or get worse. Document Released: 12/05/2004 Document Revised: 01/20/2012 Document Reviewed: 06/28/2011 [facility] Patient Information 2015 [place], [place]. This information is not intended to replace advice given to you by your health care provider. Make sure you discuss any questions you have with your health care provider.

## 2015-02-27 NOTE — ED Notes (Signed)
Pt reports she feels like something is stuck in her throat, has [facility] Xray was taken. Pt states she has cont to have difficulty swallowing, states she "feels like a bone or something is stuck " in her throat. Pt reports having "weak spells x 2 this am. Pt denies dizziness, CP, SOB. Pt denies n/v.

## 2015-02-27 NOTE — ED Provider Notes (Signed)
CSN: [id number]     Arrival date & time 02/27/15  5035 History  This chart was scribed for Tanna Furry, MD by Molli Posey, ED Scribe. This patient was seen in room APA09/APA09 and the patient's care was started 9:20 AM.     Chief Complaint  Patient presents with  . Weakness   The history is provided by the patient. No language interpreter was used.   HPI Comments: Evelyn Baird is a 81 y.o. female with a history of HTN, hyperlipidemia, peripheral neuropathy and prediabetes who presents to the Emergency Department complaining of something stuck in the left side of he throat for the last week.  Pt states the sensation is not painful but says it is irritating and uncomfrotable. Pt states that she does not hurt in the back her throat and says her throat does not feel like a typical sore throat. She states that she does not have heart burn very often. Pt denies a history of smoking.   Past Medical History  Diagnosis Date  . Essential hypertension, benign   . Hyperlipidemia   . Coronary atherosclerosis of native coronary artery     Nonobstructive 2009 - previously followed by [facility]  . Peripheral neuropathy   . Prediabetes   . PVC's (premature ventricular contractions)    Past Surgical History  Procedure Laterality Date  . Left abdominal hernia repair    . Hernia repair     Family History  Problem Relation Age of Onset  . Hypertension Father   . Kidney disease Father    History  Substance Use Topics  . Smoking status: Never Smoker   . Smokeless tobacco: Never Used  . Alcohol Use: No   OB History    Gravida Para Term Preterm AB TAB SAB Ectopic Multiple Living   7 7 7       6      Review of Systems  Constitutional: Negative for fever, chills, diaphoresis, appetite change and fatigue.  HENT: Positive for trouble swallowing. Negative for mouth sores and sore throat.   Eyes: Negative for visual disturbance.  Respiratory: Negative for cough, chest tightness, shortness of breath  and wheezing.   Cardiovascular: Negative for chest pain.  Gastrointestinal: Negative for nausea, vomiting, abdominal pain, diarrhea and abdominal distention.  Endocrine: Negative for polydipsia, polyphagia and polyuria.  Genitourinary: Negative for dysuria, frequency and hematuria.  Musculoskeletal: Negative for gait problem.  Skin: Negative for color change, pallor and rash.  Neurological: Negative for dizziness, syncope, light-headedness and headaches.  Hematological: Does not bruise/bleed easily.  Psychiatric/Behavioral: Negative for behavioral problems and confusion.      Allergies  Beef-derived products and Pork-derived products  Home Medications   Prior to Admission medications   Medication Sig Start Date End Date Taking? Authorizing Provider  aspirin EC 81 MG tablet Take 81 mg by mouth every morning.    Yes Historical Provider, MD  atorvastatin (LIPITOR) 40 MG tablet Take 1 tablet (40 mg total) by mouth daily at 6 PM. 03/25/14  Yes Belkys A Regalado, MD  gabapentin (NEURONTIN) 300 MG capsule Take 300 mg by mouth 2 (two) times daily.   Yes Historical Provider, MD  Homeopathic Products ([employer] LEG CRAMPS PAIN RELIEF PO) Take 1 tablet by mouth daily as needed. Foot cramps.   Yes Historical Provider, MD  lisinopril (PRINIVIL,ZESTRIL) 20 MG tablet Take 20 mg by mouth daily.  03/17/14  Yes Historical Provider, MD  nitroGLYCERIN (NITROSTAT) 0.4 MG SL tablet Place 0.4 mg under the tongue every  5 (five) minutes as needed. Chest pain.   Yes Historical Provider, MD  omeprazole (PRILOSEC) 20 MG capsule Take 20 mg by mouth daily.   Yes Historical Provider, MD  esomeprazole (NEXIUM) 40 MG capsule Take 1 capsule (40 mg total) by mouth daily. 02/27/15   Tanna Furry, MD  ferrous sulfate 325 (65 FE) MG tablet Take 1 tablet (325 mg total) by mouth 2 (two) times daily with a meal. Patient not taking: Reported on 02/27/2015 03/25/14   Belkys A Regalado, MD   BP 139/70 mmHg  Pulse 66  Temp(Src) 98.8 F  (37.1 C) (Oral)  Resp 14  Ht 5\' 5"  (1.651 m)  Wt 137 lb (62.143 kg)  BMI 22.80 kg/m2  SpO2 100% Physical Exam  Constitutional: She is oriented to person, place, and time. She appears well-developed and well-nourished. No distress.  HENT:  Head: Normocephalic.  1cm left, soft anterior cervical lymph node. No additional masses. Normal thyroid. No oral lesions. Upper dental plate  Eyes: Conjunctivae are normal. Pupils are equal, round, and reactive to light. No scleral icterus.  Neck: Normal range of motion. Neck supple. No thyromegaly present.  Cardiovascular: Normal rate and regular rhythm.  Exam reveals no gallop and no friction rub.   No murmur heard. Pulmonary/Chest: Effort normal and breath sounds normal. No respiratory distress. She has no wheezes. She has no rales.  Abdominal: Soft. Bowel sounds are normal. She exhibits no distension. There is no tenderness. There is no rebound.  Musculoskeletal: Normal range of motion.  Neurological: She is alert and oriented to person, place, and time.  Skin: Skin is warm and dry. No rash noted.  Psychiatric: She has a normal mood and affect. Her behavior is normal.  Nursing note and vitals reviewed.   ED Course  Procedures   DIAGNOSTIC STUDIES: Oxygen Saturation is 99% on RA, normal by my interpretation.    COORDINATION OF CARE: 9:23 AM Discussed treatment plan with pt at bedside and pt agreed to plan.   Labs Review Labs Reviewed  CBC WITH DIFFERENTIAL/PLATELET - Abnormal; Notable for the following:    Hemoglobin 11.0 (*)    HCT 34.1 (*)    RDW 15.7 (*)    All other components within normal limits  BASIC METABOLIC PANEL - Abnormal; Notable for the following:    Glucose, Bld 109 (*)    GFR calc non Af Amer 77 (*)    GFR calc Af Amer 89 (*)    All other components within normal limits    Imaging Review Ct Soft Tissue Neck W Contrast  02/27/2015   CLINICAL DATA:  Vision feels like something is stuck in left-sided his throat for  1 week. Sore throat and weakness. Neck swelling in the left side of the neck.  EXAM: CT NECK WITH CONTRAST  TECHNIQUE: Multidetector CT imaging of the neck was performed using the standard protocol following the bolus administration of intravenous contrast.  CONTRAST:  89mL OMNIPAQUE IOHEXOL 300 MG/ML  SOLN  COMPARISON:  CT of the head 09/19/2010.  FINDINGS: Pharynx and larynx: No radiopaque foreign body is evident. No focal mucosal or submucosal lesion is present. The tongue base is within normal limits. The valleculae is clear. The vocal cords are midline and symmetric.  Salivary glands: The parotid glands and submandibular glands are within normal limits bilaterally.  Thyroid: Negative  Lymph nodes: No significant cervical adenopathy is present.  Vascular: Atherosclerotic calcifications are present at the carotid bifurcations bilaterally and the aortic arch without significant stenosis  or aneurysm.  Limited intracranial: Limited visualization of the posterior fossa is unremarkable.  Visualized orbits: Not visualize  Mastoids and visualized paranasal sinuses: Clear  Skeleton: Multilevel endplate degenerative changes and uncovertebral spurring is most pronounced at C2-3 at C4-5 and at C3-4 and C5-6.  Upper chest: A 6 mm calcified granulomas present in the left upper lobe. The lungs are otherwise clear.  IMPRESSION: 1. No acute or focal lesion to explain new patient symptoms. There is no focal swelling or radiopaque foreign body. 2. Atherosclerotic changes without stenosis. 3. No significant adenopathy. 4. 6 mm granuloma at the left lung apex   Electronically Signed   By: San Morelle M.D.   On: 02/27/2015 11:26     EKG Interpretation None      MDM   Final diagnoses:  Dysphagia  Gastroesophageal reflux disease with esophagitis    Evaluation is reassuring. Her symptoms are consistent with uncomplicated dysphagia. With normal imaging would recommend proton pump inhibitor and primary care  follow-up.   I personally performed the services described in this documentation, which was scribed in my presence. The recorded information has been reviewed and is accurate.      Tanna Furry, MD 02/27/15 [phone]

## 2015-02-27 NOTE — ED Notes (Signed)
Pt states that she feels that there is something scratching on the inside left of her throat.  Started a few days ago after eating salmon cakes.  Enlarged lymph node noted to left side under jaw.  Pt denies sore throat and states that she has been able to eat and drink since this but it is uncomfortable.

## 2015-05-08 ENCOUNTER — Ambulatory Visit: Payer: Medicare Other | Admitting: Nurse Practitioner

## 2015-05-09 ENCOUNTER — Ambulatory Visit (INDEPENDENT_AMBULATORY_CARE_PROVIDER_SITE_OTHER): Payer: Medicare Other | Admitting: Nurse Practitioner

## 2015-05-09 ENCOUNTER — Encounter: Payer: Self-pay | Admitting: Nurse Practitioner

## 2015-05-09 ENCOUNTER — Other Ambulatory Visit: Payer: Self-pay

## 2015-05-09 VITALS — BP 143/78 | HR 83 | Temp 97.9°F | Ht 60.0 in | Wt 135.0 lb

## 2015-05-09 DIAGNOSIS — R1310 Dysphagia, unspecified: Secondary | ICD-10-CM | POA: Diagnosis not present

## 2015-05-09 DIAGNOSIS — R1314 Dysphagia, pharyngoesophageal phase: Secondary | ICD-10-CM

## 2015-05-09 NOTE — Patient Instructions (Signed)
1. Have your x-ray tests done when he can. 2. Return for follow-up in 4-8 weeks.

## 2015-05-09 NOTE — Progress Notes (Signed)
Primary Care Physician:  Fredirick Maudlin, MD Primary Gastroenterologist:  Dr. Oneida Alar  Chief Complaint  Patient presents with  . Dysphagia    HPI:   Evelyn Baird-year-old female presents on referral from PCP for dysphagia. PCP notes reviewed. Per last visit with PCP which occurred on 04/06/2015 patient complaining of dysphagia for 2-3 months, feels like she "has a fishbone stuck in her throat" however x-ray was negative for any foreign object. CT of the soft tissues of the neck with contrast performed on 02/27/2015 found no acute or focal lesion to explain the patient's symptoms. States she coughed a lot when she was swallowing no pain but feels a lump in her throat.  Today she states she has been having some dysphagia symptoms. Also feels like she has a knott or foreign body sensaion in her throat on the right side and anterior. She states she was told it is likely due to allergies. CT again did not show any concerning cervical lymphadenopathy. Denies regurgitation. Has not had barium swallow evaluation. Denies hematochezia and melena. Admits some postprandial coughing occasionally. She stopped taking her reflux pill about a month ago because it made her feel sick, sickness feeling improved after stopping PPI. Denies fever, unintentional weight loss. Denies chest pain, dyspnea, dizziness, lightheadedness, syncope, near syncope. Denies any other upper or lower GI symptoms.  Past Medical History  Diagnosis Date  . Essential hypertension, benign   . Hyperlipidemia   . Coronary atherosclerosis of native coronary artery     Nonobstructive 2009 - previously followed by [facility]  . Peripheral neuropathy   . Prediabetes   . PVC's (premature ventricular contractions)     Past Surgical History  Procedure Laterality Date  . Left abdominal hernia repair    . Hernia repair      Current Outpatient Prescriptions  Medication Sig Dispense Refill  . aspirin EC 81 MG tablet Take 81 mg by mouth every morning.       Marland Kitchen atorvastatin (LIPITOR) 40 MG tablet Take 1 tablet (40 mg total) by mouth daily at 6 PM. 30 tablet 0  . lisinopril (PRINIVIL,ZESTRIL) 20 MG tablet Take 20 mg by mouth daily.     . nitroGLYCERIN (NITROSTAT) 0.4 MG SL tablet Place 0.4 mg under the tongue every 5 (five) minutes as needed. Chest pain.     No current facility-administered medications for this visit.    Allergies as of 05/09/2015 - Review Complete 05/09/2015  Allergen Reaction Noted  . Beef-derived products Itching and Nausea And Vomiting 04/11/2014  . Pork-derived products Itching and Nausea And Vomiting 04/11/2014    Family History  Problem Relation Age of Onset  . Hypertension Father   . Kidney disease Father     History   Social History  . Marital Status: Widowed    Spouse Name: N/A  . Number of Children: N/A  . Years of Education: N/A   Occupational History  . Not on file.   Social History Main Topics  . Smoking status: Never Smoker   . Smokeless tobacco: Never Used  . Alcohol Use: No  . Drug Use: No  . Sexual Activity: No   Other Topics Concern  . Not on file   Social History Narrative    Review of Systems: 10 point ROS negative except as per HPI.    Physical Exam: BP 143/78 mmHg  Pulse 83  Temp(Src) 97.9 F (36.6 C) (Oral)  Ht 5' (1.524 m)  Wt 135 lb (61.236 kg)  BMI 26.37  kg/m2 General:   Alert and oriented. Pleasant and cooperative. Well-nourished and well-developed.  Head:  Normocephalic and atraumatic. Eyes:  Without icterus, sclera clear and conjunctiva pink.  Ears:  Normal auditory acuity. Mouth:  No deformity or lesions, oral mucosa pink. No OP edema noted. Throat/Neck:  Supple, without mass or thyromegaly. No nodules or lumps appreciated. Cardiovascular:  S1, S2 present with systolic murmur noted. Normal pulses noted. Extremities without clubbing or edema. Respiratory:  Clear to auscultation bilaterally. No wheezes, rales, or rhonchi. No distress.  Gastrointestinal:  +BS,  soft, non-tender and non-distended. No HSM noted. No guarding or rebound. No masses appreciated.  Rectal:  Deferred  Musculoskalatal:  Symmetrical without gross deformities. Normal posture. Skin:  Intact without significant lesions or rashes. Neurologic:  Alert and oriented x4;  grossly normal neurologically. Psych:  Alert and cooperative. Normal mood and affect. Heme/Lymph/Immune: No significant cervical adenopathy.    05/09/2015 9:30 AM

## 2015-05-09 NOTE — Progress Notes (Signed)
cc'ed to pcp °

## 2015-05-09 NOTE — Assessment & Plan Note (Addendum)
81 year old female referred for dysphagia symptoms which mostly includes foreign objects sensation or "lump in her throat "when swallowing. This is been occurring for 2-3 months. About one month ago she stopped taking her PPI does not want to take 1 any longer because it makes her feel sick. CT of the neck and soft tissues does not demonstrate any foreign object nor concerning lymphadenopathy. Study was essentially normal. Per PCP note and patient as well as family, patient does have occasional coughing after swallowing. At this point we will order a barium pill esophagram to further evaluate for stricture, web, ring as well as any dysmotility disorders and possible aspiration. Depending on the results of these were have her return in 6-8 weeks for follow-up and further recommendations.

## 2015-05-16 ENCOUNTER — Ambulatory Visit (HOSPITAL_COMMUNITY)
Admission: RE | Admit: 2015-05-16 | Discharge: 2015-05-16 | Disposition: A | Discharge status: Home / Self Care | Payer: Medicare Other | Source: Ambulatory Visit | Attending: Nurse Practitioner | Admitting: Nurse Practitioner

## 2015-05-16 DIAGNOSIS — K224 Dyskinesia of esophagus: Secondary | ICD-10-CM | POA: Diagnosis not present

## 2015-05-16 DIAGNOSIS — R1314 Dysphagia, pharyngoesophageal phase: Secondary | ICD-10-CM | POA: Diagnosis present

## 2015-06-20 ENCOUNTER — Encounter: Payer: Self-pay | Admitting: Nurse Practitioner

## 2015-06-20 ENCOUNTER — Ambulatory Visit: Payer: Medicare Other | Admitting: Nurse Practitioner

## 2015-06-20 ENCOUNTER — Ambulatory Visit (INDEPENDENT_AMBULATORY_CARE_PROVIDER_SITE_OTHER): Payer: Medicare Other | Admitting: Nurse Practitioner

## 2015-06-20 VITALS — BP 125/71 | HR 70 | Temp 99.4°F | Ht 65.0 in | Wt 134.2 lb

## 2015-06-20 DIAGNOSIS — K224 Dyskinesia of esophagus: Secondary | ICD-10-CM | POA: Diagnosis not present

## 2015-06-20 NOTE — Progress Notes (Signed)
cc'ed to pcp °

## 2015-06-20 NOTE — Progress Notes (Signed)
Referring Provider: No ref. provider found Primary Care Physician:  Petra Kuba, MD Primary GI:  Dr. Oneida Alar  Chief Complaint  Patient presents with  . Follow-up    Difficulty swallowing    HPI:   81 year old female returns for follow-up on dysphagia symptoms. She was last seen 05/09/2015 at which point she stated she was having dysphagia for 2-3 months. CT of the neck showed no abnormalities. She was sent for a diagnostic esophagram. This showed diffuse impairment of esophageal motility likely due to age, mild diffuse dilation of a hypertonic thoracic esophagus without mass or obstruction.  Today she states she's doing pretty well. If she sits forward while eating she has swallowing difficulties. Also difficulties with certain foods. Does better with soft foods and if she eats small bites with copious water. Denies abdominal pain, N/V, regurgitation, hematochezia, melena. Does have nighttime coughing when she lays flat and phlegm. She does have a significant history of allergies and sinus drainage. Denies chest pain, dyspnea, dizziness, lightheadedness, syncope, near syncope. Denies any other upper or lower GI symptoms.  Past Medical History  Diagnosis Date  . Essential hypertension, benign   . Hyperlipidemia   . Coronary atherosclerosis of native coronary artery     Nonobstructive 2009 - previously followed by [facility]  . Peripheral neuropathy   . Prediabetes   . PVC's (premature ventricular contractions)     Past Surgical History  Procedure Laterality Date  . Left abdominal hernia repair    . Hernia repair      Current Outpatient Prescriptions  Medication Sig Dispense Refill  . aspirin EC 81 MG tablet Take 81 mg by mouth every morning.     Marland Kitchen atorvastatin (LIPITOR) 40 MG tablet Take 1 tablet (40 mg total) by mouth daily at 6 PM. 30 tablet 0  . lisinopril (PRINIVIL,ZESTRIL) 20 MG tablet Take 20 mg by mouth daily.     . nitroGLYCERIN (NITROSTAT) 0.4 MG SL tablet Place 0.4  mg under the tongue every 5 (five) minutes as needed. Chest pain.     No current facility-administered medications for this visit.    Allergies as of 06/20/2015 - Review Complete 06/20/2015  Allergen Reaction Noted  . Beef-derived products Itching and Nausea And Vomiting 04/11/2014  . Pork-derived products Itching and Nausea And Vomiting 04/11/2014    Family History  Problem Relation Age of Onset  . Hypertension Father   . Kidney disease Father     History   Social History  . Marital Status: Widowed    Spouse Name: N/A  . Number of Children: N/A  . Years of Education: N/A   Social History Main Topics  . Smoking status: Never Smoker   . Smokeless tobacco: Never Used     Comment: Never smoked  . Alcohol Use: No  . Drug Use: No  . Sexual Activity: No   Other Topics Concern  . None   Social History Narrative    Review of Systems: 10 point ROS negative except as per HPI.   Physical Exam: BP 125/71 mmHg  Pulse 70  Temp(Src) 99.4 F (37.4 C) (Oral)  Ht 5\' 5"  (1.651 m)  Wt 134 lb 3.2 oz (60.873 kg)  BMI 22.33 kg/m2 General:   Alert and oriented. Pleasant and cooperative. Well-nourished and well-developed.  Head:  Normocephalic and atraumatic. Throat/Neck:  Supple, without mass or thyromegaly. Cardiovascular:  S1, S2 present without murmurs appreciated. Normal pulses noted. Extremities without clubbing or edema. Respiratory:  Clear to auscultation bilaterally. No  wheezes, rales, or rhonchi. No distress.  Gastrointestinal:  +BS, soft, non-tender and non-distended. No HSM noted. No guarding or rebound. No masses appreciated.  Rectal:  Deferred  Neurologic:  Alert and oriented x4;  grossly normal neurologically. Psych:  Alert and cooperative. Normal mood and affect. Heme/Lymph/Immune: No significant cervical adenopathy.    06/20/2015 8:55 AM

## 2015-06-20 NOTE — Patient Instructions (Addendum)
1. Follow the instructions I provided you to help with her symptoms while eating. 2. Return as needed for any worsening symptoms.   Dysphagia Level 3 Diet, Mechanically Advanced The dysphagia level 3 diet includes foods that are soft, moist, and can be chopped into 1-inch chunks. This diet is helpful for people with mild swallowing difficulties. It reduces the risk of food getting caught in the windpipe, trachea, or lungs. WHAT DO I NEED TO KNOW ABOUT THIS DIET?  You may eat foods that are soft and moist.  If you were on the dysphagia level 1 or level 2 diets, you may eat any of the foods included on those lists.  Avoid foods that are dry, hard, sticky, chewy, coarse, and crunchy. Also avoid large cuts of food.  Take small bites. Each bite should contain 1 inch or less of food.  Thicken liquids if instructed by your health care provider. Follow your health care provider's instructions on how to do this and to what consistency.  See your dietitian or speech language pathologist regularly for help with your dietary changes. WHAT FOODS CAN I EAT? Grains Moist breads without nuts or seeds. Biscuits, muffins, pancakes, and waffles well-moistened with syrup, jelly, margarine, or butter. Smooth cereals with plenty of milk to moisten them. Moist bread stuffing. Moist rice. Vegetables All cooked, soft vegetables. Shredded lettuce. Tender fried potatoes. Fruits All canned and cooked fruits. Soft, peeled fresh fruits, such as peaches, nectarines, kiwis, cantaloupe, honeydew melon, and watermelon without seeds. Soft berries, such as strawberries. Meat and Other Protein Sources Moist ground or finely diced or sliced meats. Solid, tender cuts of meat. Meatloaf. Hamburger with a bun. Sausage patty. Deli thin-sliced lunch meat. Chicken, egg, or tuna salad sandwich. Sloppy joe. Moist fish. Eggs prepared any way. Casseroles with small chunks of meats, ground meats, or tender meats. Dairy Cheese spreads  without coarse large chunks. Shredded cheese. Cheese slices. Cottage cheese. Milk at the right texture. Smooth frappes. Yogurt without nuts or coconut. Ask your health care provider whether you can have frozen desserts (such as malts or milk shakes) and thin liquids. Sweets/Desserts Soft, smooth, moist desserts. Non-chewy, smooth candy. Jam. Jelly. Honey. Preserves. Ask your health care provider whether you can have frozen desserts. Fats and Oils Butter. Oils. Margarine. Mayonnaise. Gravy. Spreads. Other All seasonings and sweeteners. All sauces without large chunks. The items listed above may not be a complete list of recommended foods or beverages. Contact your dietitian for more options. WHAT FOODS ARE NOT RECOMMENDED? Grains Coarse or dry cereals. Dry breads. Toast. Crackers. Tough, crusty breads, such French bread and baguettes. Tough, crisp fried potatoes. Potato skins. Dry bread stuffing. Granola. Popcorn. Chips. Vegetables All raw vegetables except shredded lettuce. Cooked corn. Rubbery or stiff cooked vegetables. Stringy vegetables, such as celery. Fruits Hard fruits that are difficult to chew, such as apples or pears. Stringy, high-pulp fruits, such as pineapple, papaya, or mango. Fruits with tough skins, such as grapes. Coconut. All dried fruits. Fruit leather. Fruit roll-ups. Fruit snacks. Meat and Other Protein Sources Dry or tough meats or poultry. Dry fish. Fish with bones. Peanut butter. All nuts and seeds. Dairy  Any with nuts, seeds, chocolate chips, dried fruit, coconut, or pineapple. Sweets/Desserts Dry cakes. Chewy or dry cookies. Any with nuts, seeds, dry fruits, coconut, pineapple, or anything dry, sticky, or hard. Chewy caramel. Licorice. Taffy-type candies. Ask your health care provider whether you can have frozen desserts. Fats and Oils Any with chunks, nuts, seeds, or pineapple. Olives.  Evelyn Baird. Other Soups with tough or large chunks of meats, poultry, or  vegetables. Corn or clam chowder. The items listed above may not be a complete list of foods and beverages to avoid. Contact your dietitian for more information. Document Released: 10/28/2005 Document Revised: 06/30/2013 Document Reviewed: 10/11/2013 [facility] Patient Information 2015 [place], [place]. This information is not intended to replace advice given to you by your health care provider. Make sure you discuss any questions you have with your health care provider.

## 2015-06-20 NOTE — Assessment & Plan Note (Signed)
Age-related esophageal dysmotility confirmed on diagnostic esophagram. Patient has been doing quite well with modified eating techniques and softer foods. Provided extensive education on eating mechanics, taking smaller bites, and using items such as gravies to soften foods. We'll provide additional written educational materials through the reference is section. We'll have her return as needed for any change in symptoms.

## 2015-08-02 ENCOUNTER — Emergency Department (HOSPITAL_COMMUNITY): Payer: Medicare Other

## 2015-08-02 ENCOUNTER — Encounter (HOSPITAL_COMMUNITY): Payer: Self-pay | Admitting: Emergency Medicine

## 2015-08-02 ENCOUNTER — Emergency Department (HOSPITAL_COMMUNITY)
Admission: EM | Admit: 2015-08-02 | Discharge: 2015-08-02 | Disposition: A | Discharge status: Home / Self Care | Payer: Medicare Other | Attending: Emergency Medicine | Admitting: Emergency Medicine

## 2015-08-02 DIAGNOSIS — I10 Essential (primary) hypertension: Secondary | ICD-10-CM | POA: Insufficient documentation

## 2015-08-02 DIAGNOSIS — I2510 Atherosclerotic heart disease of native coronary artery without angina pectoris: Secondary | ICD-10-CM | POA: Insufficient documentation

## 2015-08-02 DIAGNOSIS — R142 Eructation: Secondary | ICD-10-CM | POA: Insufficient documentation

## 2015-08-02 DIAGNOSIS — Z7982 Long term (current) use of aspirin: Secondary | ICD-10-CM | POA: Diagnosis not present

## 2015-08-02 DIAGNOSIS — E785 Hyperlipidemia, unspecified: Secondary | ICD-10-CM | POA: Insufficient documentation

## 2015-08-02 DIAGNOSIS — Z79899 Other long term (current) drug therapy: Secondary | ICD-10-CM | POA: Diagnosis not present

## 2015-08-02 DIAGNOSIS — Z9889 Other specified postprocedural states: Secondary | ICD-10-CM | POA: Insufficient documentation

## 2015-08-02 DIAGNOSIS — Z8669 Personal history of other diseases of the nervous system and sense organs: Secondary | ICD-10-CM | POA: Insufficient documentation

## 2015-08-02 DIAGNOSIS — E119 Type 2 diabetes mellitus without complications: Secondary | ICD-10-CM | POA: Insufficient documentation

## 2015-08-02 DIAGNOSIS — R079 Chest pain, unspecified: Secondary | ICD-10-CM | POA: Diagnosis present

## 2015-08-02 LAB — COMPREHENSIVE METABOLIC PANEL
ALK PHOS: 89 U/L (ref 38–126)
ALT: 11 U/L — ABNORMAL LOW (ref 14–54)
ANION GAP: 4 — AB (ref 5–15)
AST: 23 U/L (ref 15–41)
Albumin: 3.9 g/dL (ref 3.5–5.0)
BUN: 12 mg/dL (ref 6–20)
CALCIUM: 8.8 mg/dL — AB (ref 8.9–10.3)
CO2: 27 mmol/L (ref 22–32)
Chloride: 109 mmol/L (ref 101–111)
Creatinine, Ser: 0.73 mg/dL (ref 0.44–1.00)
GFR calc non Af Amer: >60 mL/min (ref >60)
Glucose, Bld: 95 mg/dL (ref 65–99)
Potassium: 4.4 mmol/L (ref 3.5–5.1)
SODIUM: 140 mmol/L (ref 135–145)
TOTAL PROTEIN: 7.4 g/dL (ref 6.5–8.1)
Total Bilirubin: 0.5 mg/dL (ref 0.3–1.2)

## 2015-08-02 LAB — CBC WITH DIFFERENTIAL/PLATELET
Basophils Absolute: 0 10*3/uL (ref 0.0–0.1)
Basophils Relative: 0 %
EOS ABS: 0 10*3/uL (ref 0.0–0.7)
Eosinophils Relative: 1 %
HCT: 33.9 % — ABNORMAL LOW (ref 36.0–46.0)
Hemoglobin: 11 g/dL — ABNORMAL LOW (ref 12.0–15.0)
LYMPHS ABS: 1.3 10*3/uL (ref 0.7–4.0)
Lymphocytes Relative: 37 %
MCH: 25.9 pg — AB (ref 26.0–34.0)
MCHC: 32.4 g/dL (ref 30.0–36.0)
MCV: 80 fL (ref 78.0–100.0)
MONO ABS: 0.3 10*3/uL (ref 0.1–1.0)
MONOS PCT: 8 %
Neutro Abs: 1.9 10*3/uL (ref 1.7–7.7)
Neutrophils Relative %: 54 %
Platelets: 199 10*3/uL (ref 150–400)
RBC: 4.24 MIL/uL (ref 3.87–5.11)
RDW: 15.8 % — ABNORMAL HIGH (ref 11.5–15.5)
WBC: 3.5 10*3/uL — ABNORMAL LOW (ref 4.0–10.5)

## 2015-08-02 LAB — I-STAT TROPONIN, ED
Troponin i, poc: 0.01 ng/mL (ref 0.00–0.08)
Troponin i, poc: 0 ng/mL (ref 0.00–0.08)

## 2015-08-02 LAB — BRAIN NATRIURETIC PEPTIDE: B NATRIURETIC PEPTIDE 5: 72 pg/mL (ref 0.0–100.0)

## 2015-08-02 MED ORDER — ASPIRIN 81 MG PO CHEW
324.0000 mg | CHEWABLE_TABLET | Freq: Once | ORAL | Status: AC
  Administered 2015-08-02: 324 mg via ORAL
  Filled 2015-08-02: qty 4

## 2015-08-02 MED ORDER — PANTOPRAZOLE SODIUM 20 MG PO TBEC: 40.0000 mg | DELAYED_RELEASE_TABLET | Freq: Every day | ORAL | Status: DC

## 2015-08-02 NOTE — Discharge Instructions (Signed)

## 2015-08-02 NOTE — ED Notes (Signed)
Having chest pain since last night.  Rates pain 3/10 currently.  Took 81 mg ASA this am.  Pain to left chest under breast.  Pain is mostly when breathing in.

## 2015-08-02 NOTE — ED Notes (Signed)
Patient given ginger ale to drink while waiting on son to arrive.

## 2015-08-02 NOTE — ED Provider Notes (Signed)
CSN: [id number]     Arrival date & time 08/02/15  1962 History  This chart was scribed for Gareth Morgan, MD by Stephania Fragmin, ED Scribe. This patient was seen in room APA01/APA01 and the patient's care was started at 9:59 AM.    Chief Complaint  Patient presents with  . Chest Pain   Patient is a 81 y.o. female presenting with chest pain. The history is provided by the patient. No language interpreter was used.  Chest Pain Pain location:  L chest Pain quality: pressure   Pain quality: not sharp   Pain radiates to:  Does not radiate Pain radiates to the back: no   Pain severity:  Mild Onset quality:  Gradual Duration:  13 hours Timing:  Intermittent Progression:  Improving Chronicity:  Recurrent Relieved by: belching. Worsened by:  Nothing tried Ineffective treatments:  None tried Associated symptoms: no abdominal pain, no back pain, no cough, no fever, no headache, no nausea and no shortness of breath   Risk factors: high cholesterol and hypertension   Risk factors: no prior DVT/PE     HPI Comments: Evelyn Baird is a 81 y.o. female with a history of HTN, HLD, new diagnosis of DM, and non-obstructive CAD with last catheterization in 2009 per chart review, who presents to the Emergency Department complaining of intermittent, pressure-like chest pain underneath her left breast that began last night around the time she ate supper, about 13 hours ago. She states she has no chest pain at this time. Patient also complains of associated frequent belching, which alleviates her pain. Exertion does not affect her chest pain. She has had a similar pain in the past. She denies a history of cardiac stents, DVT, or PE. She also denies SOB, nausea, or lightheadedness.  Last Cath 2009 showing nonobstructive CAD. Had myoview in 03/2014  Past Medical History  Diagnosis Date  . Essential hypertension, benign   . Hyperlipidemia   . Coronary atherosclerosis of native coronary artery     Nonobstructive  2009 - previously followed by [facility]  . Peripheral neuropathy   . Prediabetes   . PVC's (premature ventricular contractions)    Past Surgical History  Procedure Laterality Date  . Left abdominal hernia repair    . Hernia repair     Family History  Problem Relation Age of Onset  . Hypertension Father   . Kidney disease Father    Social History  Substance Use Topics  . Smoking status: Never Smoker   . Smokeless tobacco: Never Used     Comment: Never smoked  . Alcohol Use: No   OB History    Gravida Para Term Preterm AB TAB SAB Ectopic Multiple Living   7 7 7       6      Review of Systems  Constitutional: Negative for fever.  HENT: Negative for sore throat.   Eyes: Negative for visual disturbance.  Respiratory: Negative for cough and shortness of breath.   Cardiovascular: Positive for chest pain.  Gastrointestinal: Negative for nausea and abdominal pain.  Genitourinary: Negative for difficulty urinating.  Musculoskeletal: Negative for back pain and neck pain.  Skin: Negative for rash.  Neurological: Negative for syncope, light-headedness and headaches.  All other systems reviewed and are negative.  Allergies  Beef-derived products and Pork-derived products  Home Medications   Prior to Admission medications   Medication Sig Start Date End Date Taking? Authorizing Provider  aspirin EC 81 MG tablet Take 81 mg by mouth every morning.  Historical Provider, MD  atorvastatin (LIPITOR) 40 MG tablet Take 1 tablet (40 mg total) by mouth daily at 6 PM. 03/25/14   Belkys A Regalado, MD  lisinopril (PRINIVIL,ZESTRIL) 20 MG tablet Take 20 mg by mouth daily.  03/17/14   Historical Provider, MD  nitroGLYCERIN (NITROSTAT) 0.4 MG SL tablet Place 0.4 mg under the tongue every 5 (five) minutes as needed. Chest pain.    Historical Provider, MD   There were no vitals taken for this visit. Physical Exam  Constitutional: She is oriented to person, place, and time. She appears well-developed  and well-nourished. No distress.  HENT:  Head: Normocephalic and atraumatic.  Eyes: Conjunctivae and EOM are normal.  Neck: Normal range of motion. Neck supple. No tracheal deviation present.  Cardiovascular: Normal rate, regular rhythm, normal heart sounds and intact distal pulses.  Exam reveals no gallop and no friction rub.   No murmur heard. Pulmonary/Chest: Effort normal and breath sounds normal. No respiratory distress. She has no wheezes. She has no rales.  Abdominal: Soft. She exhibits no distension. There is no tenderness. There is no guarding.  Musculoskeletal: Normal range of motion. She exhibits no edema or tenderness.  Neurological: She is alert and oriented to person, place, and time.  Skin: Skin is warm and dry. No rash noted. She is not diaphoretic. No erythema.  Psychiatric: She has a normal mood and affect. Her behavior is normal.  Nursing note and vitals reviewed.   ED Course  Procedures (including critical care time)  DIAGNOSTIC STUDIES: Oxygen Saturation is 100% on RA, normal by my interpretation.    COORDINATION OF CARE: 10:10 AM - Discussed treatment plan with pt at bedside which includes lab tests and consult with cardiologist Dr. Domenic Polite. Will perform troponin tests and perform CXR to r/o pneumothorax and CHF. Will also consult with cardiologist Dr. Domenic Polite. Informed pt that she may need hospital admission for overnight monitoring, Pt verbalized understanding and agreed to plan.   10:14 AM - Consult with Dr. Domenic Polite, who agreed to evaluate pt.  Labs Review Labs Reviewed  CBC WITH DIFFERENTIAL/PLATELET  COMPREHENSIVE METABOLIC PANEL  BRAIN NATRIURETIC PEPTIDE  I-STAT [place], ED    Imaging Review No results found. I have personally reviewed and evaluated these images and lab results as part of my medical decision-making.   EKG Interpretation   Date/Time:  Wednesday August 02 2015 09:38:38 EDT Ventricular Rate:  63 PR Interval:  212 QRS  Duration: 100 QT Interval:  455 QTC Calculation: 466 R Axis:   -39 Text Interpretation:  Sinus rhythm Borderline prolonged PR interval Left  axis deviation Borderline T wave abnormalities Baseline wander in lead(s)  II III aVL aVF V4 No significant change since last tracing Confirmed by  [facility] MD, ERIN ([id number]) on 08/02/2015 10:13:45 AM      MDM   Final diagnoses:  None   81 year old female with history of nonobstructive CAD, hypertension, new diagnosis of DM per pt, hyperlipidemia presents with concern of chest pain. Differential diagnosis for chest pain includes pulmonary embolus, dissection, pneumothorax, pneumonia, ACS, myocarditis, pericarditis.  EKG was done and evaluate by me and showed no acute ST changes and no signs of pericarditis, showing nonspecific TW changes. Chest x-ray was done and evaluated by me and radiology and showed  no sign of pneumonia or pneumothorax. Patient is low risk [place] and have low suspicion for PE by history and vital signs. Patient is high risk HEART score and Cardiology on call, Dr. Harl Bowie was called and evaluated  her information.  He recommended if patient's troponins and repeat EKG are unchanged for pt to follow up with outpatient cardiology.  Patient has had similar CP (pressure relieved with burping, nonexertional) wiith evaluation 03/2014 with myoview WNL.  She had delta troponins which were both negative. Given this evaluation, history and physical have low suspicion for pulmonary embolus, pneumonia, ACS, myocarditis, pericarditis, dissection.   Pt with GERD versus anginal equivalent with Cardiology to follow up closely as an outpatient. Patient discharged in stable condition with understanding of reasons to return.  I personally performed the services described in this documentation, which was scribed in my presence. The recorded information has been reviewed and is accurate.   Gareth Morgan, MD 08/02/15 2100

## 2015-08-02 NOTE — ED Notes (Signed)
Patient's son called to pick up patient. D/C prescription and paper given and reviewed. Patient verbalized understanding, will review paperwork  with son as well when he arrives.

## 2015-08-02 NOTE — Progress Notes (Signed)
Discussed patient with ER staff, history reviewed. Long history of chest pain with negative evaluations in the past in including a cath in 2009 and most recently negative nuclear stress 03/2014. Presents with atypical chest pain after eating, better with belching. EKG with chronic nonspecific ST/T changes. Likely GI etiology of symptoms. From cardiac standpoint if troponin and repeat EKG are normal do not see indication for inpatient evaluation, she may f/u with [place] in 1-2 weeks as outpatient. If discharged from ER would consider trial of H2 blocker or PPI for possible GERD   Zandra Abts MD

## 2015-08-04 ENCOUNTER — Emergency Department (HOSPITAL_COMMUNITY): Payer: Medicare Other

## 2015-08-04 ENCOUNTER — Encounter (HOSPITAL_COMMUNITY): Payer: Self-pay | Admitting: Cardiology

## 2015-08-04 ENCOUNTER — Emergency Department (HOSPITAL_COMMUNITY)
Admission: EM | Admit: 2015-08-04 | Discharge: 2015-08-04 | Disposition: A | Discharge status: Home / Self Care | Payer: Medicare Other | Attending: Emergency Medicine | Admitting: Emergency Medicine

## 2015-08-04 DIAGNOSIS — R079 Chest pain, unspecified: Secondary | ICD-10-CM | POA: Diagnosis present

## 2015-08-04 DIAGNOSIS — K228 Other specified diseases of esophagus: Secondary | ICD-10-CM | POA: Diagnosis not present

## 2015-08-04 DIAGNOSIS — I10 Essential (primary) hypertension: Secondary | ICD-10-CM | POA: Insufficient documentation

## 2015-08-04 DIAGNOSIS — R531 Weakness: Secondary | ICD-10-CM | POA: Insufficient documentation

## 2015-08-04 DIAGNOSIS — Z79899 Other long term (current) drug therapy: Secondary | ICD-10-CM | POA: Insufficient documentation

## 2015-08-04 DIAGNOSIS — Z8669 Personal history of other diseases of the nervous system and sense organs: Secondary | ICD-10-CM | POA: Diagnosis not present

## 2015-08-04 DIAGNOSIS — Z7982 Long term (current) use of aspirin: Secondary | ICD-10-CM | POA: Insufficient documentation

## 2015-08-04 DIAGNOSIS — E785 Hyperlipidemia, unspecified: Secondary | ICD-10-CM | POA: Insufficient documentation

## 2015-08-04 DIAGNOSIS — K224 Dyskinesia of esophagus: Secondary | ICD-10-CM

## 2015-08-04 DIAGNOSIS — I2510 Atherosclerotic heart disease of native coronary artery without angina pectoris: Secondary | ICD-10-CM | POA: Insufficient documentation

## 2015-08-04 LAB — COMPREHENSIVE METABOLIC PANEL
ALBUMIN: 3.9 g/dL (ref 3.5–5.0)
ALT: 10 U/L — AB (ref 14–54)
AST: 22 U/L (ref 15–41)
Alkaline Phosphatase: 88 U/L (ref 38–126)
Anion gap: 6 (ref 5–15)
BUN: 13 mg/dL (ref 6–20)
CHLORIDE: 109 mmol/L (ref 101–111)
CO2: 26 mmol/L (ref 22–32)
Calcium: 8.8 mg/dL — ABNORMAL LOW (ref 8.9–10.3)
Creatinine, Ser: 0.81 mg/dL (ref 0.44–1.00)
GFR calc non Af Amer: >60 mL/min (ref >60)
Glucose, Bld: 91 mg/dL (ref 65–99)
Potassium: 4.1 mmol/L (ref 3.5–5.1)
SODIUM: 141 mmol/L (ref 135–145)
Total Bilirubin: 0.2 mg/dL — ABNORMAL LOW (ref 0.3–1.2)
Total Protein: 7.2 g/dL (ref 6.5–8.1)

## 2015-08-04 LAB — CBC
HCT: 32.3 % — ABNORMAL LOW (ref 36.0–46.0)
Hemoglobin: 10.5 g/dL — ABNORMAL LOW (ref 12.0–15.0)
MCH: 26.1 pg (ref 26.0–34.0)
MCHC: 32.5 g/dL (ref 30.0–36.0)
MCV: 80.1 fL (ref 78.0–100.0)
Platelets: 181 10*3/uL (ref 150–400)
RBC: 4.03 MIL/uL (ref 3.87–5.11)
RDW: 15.9 % — ABNORMAL HIGH (ref 11.5–15.5)
WBC: 3.6 10*3/uL — ABNORMAL LOW (ref 4.0–10.5)

## 2015-08-04 LAB — TROPONIN I

## 2015-08-04 MED ORDER — ASPIRIN 81 MG PO CHEW: 324.0000 mg | CHEWABLE_TABLET | Freq: Once | ORAL | Status: DC

## 2015-08-04 MED ORDER — ASPIRIN 81 MG PO CHEW
243.0000 mg | CHEWABLE_TABLET | Freq: Once | ORAL | Status: AC
  Administered 2015-08-04: 243 mg via ORAL
  Filled 2015-08-04: qty 3

## 2015-08-04 MED ORDER — IOHEXOL 350 MG/ML SOLN
100.0000 mL | Freq: Once | INTRAVENOUS | Status: AC | PRN
  Administered 2015-08-04: 100 mL via INTRAVENOUS

## 2015-08-04 NOTE — ED Provider Notes (Signed)
CSN: [id number]     Arrival date & time 08/04/15  1144 History  This chart was scribed for No att. providers found by Terressa Koyanagi, ED Scribe. This patient was seen in room APA14/APA14 and the patient's care was started at 12:06 PM.   Chief Complaint  Patient presents with  . Chest Pain   The history is provided by the patient. No language interpreter was used.   PCP: Petra Kuba, MD HPI Comments: Evelyn Baird is a 81 y.o. female who presents to the Emergency Department complaining of weakness onset yesterday after pt took nitroglycerin following an episode of chest pain. Pt notes she had another episode of chest pain this morning which again was relieved with nitroglycerin. Pt notes she was treated at the ED two days ago for chest pain. Pt denies cough, fever, n/v, diaphoresis, Hx of heart attack, Hx of stent placement. Pt denies any medicinal allergies.   Past Medical History  Diagnosis Date  . Essential hypertension, benign   . Hyperlipidemia   . Coronary atherosclerosis of native coronary artery     Nonobstructive 2009 - previously followed by [facility]  . Peripheral neuropathy   . Prediabetes   . PVC's (premature ventricular contractions)    Past Surgical History  Procedure Laterality Date  . Left abdominal hernia repair    . Hernia repair     Family History  Problem Relation Age of Onset  . Hypertension Father   . Kidney disease Father    Social History  Substance Use Topics  . Smoking status: Never Smoker   . Smokeless tobacco: Never Used     Comment: Never smoked  . Alcohol Use: No   OB History    Gravida Para Term Preterm AB TAB SAB Ectopic Multiple Living   7 7 7       6      Review of Systems  Constitutional: Negative for fever.  Respiratory: Negative for cough.   Cardiovascular: Positive for chest pain (two episodes in last 24 hours resolved after ingesting ntg).  Gastrointestinal: Negative for nausea and vomiting.  Neurological: Positive for weakness.   All other systems reviewed and are negative.     Allergies  Beef-derived products and Pork-derived products  Home Medications   Prior to Admission medications   Medication Sig Start Date End Date Taking? Authorizing Provider  aspirin EC 81 MG tablet Take 81 mg by mouth every morning.    Yes Historical Provider, MD  atorvastatin (LIPITOR) 40 MG tablet Take 1 tablet (40 mg total) by mouth daily at 6 PM. 03/25/14  Yes Belkys A Regalado, MD  lisinopril (PRINIVIL,ZESTRIL) 20 MG tablet Take 20 mg by mouth daily.  03/17/14  Yes Historical Provider, MD  nitroGLYCERIN (NITROSTAT) 0.4 MG SL tablet Place 0.4 mg under the tongue every 5 (five) minutes as needed. Chest pain.   Yes Historical Provider, MD  pantoprazole (PROTONIX) 20 MG tablet Take 2 tablets (40 mg total) by mouth daily. Patient not taking: Reported on 08/04/2015 08/02/15   Gareth Morgan, MD   Triage Vitals: BP 118/80 mmHg  Pulse 74  Temp(Src) 99.1 F (37.3 C) (Oral)  Resp 22  Ht 5' 6.5" (1.689 m)  Wt 134 lb (60.782 kg)  BMI 21.31 kg/m2  SpO2 98% Physical Exam  Constitutional: She is oriented to person, place, and time. She appears well-developed and well-nourished. No distress.  HENT:  Head: Normocephalic and atraumatic.  Eyes: EOM are normal.  Neck: Normal range of motion.  Cardiovascular: Normal  rate, regular rhythm and normal heart sounds.   No murmur heard. Pulmonary/Chest: Effort normal and breath sounds normal. No respiratory distress.  Tenderness to palpation of costal cartilage border   Abdominal: Soft. Bowel sounds are normal. She exhibits no distension. There is no tenderness.  Musculoskeletal: Normal range of motion. She exhibits no edema.  Neurological: She is alert and oriented to person, place, and time.  Skin: Skin is warm and dry.  Psychiatric: She has a normal mood and affect. Judgment normal.  Nursing note and vitals reviewed.   ED Course  Procedures (including critical care time) DIAGNOSTIC  STUDIES: Oxygen Saturation is 98% on RA, nl by my interpretation.    COORDINATION OF CARE: 12:11 PM: Discussed treatment plan which includes discussing EKG results, labs, and imaging with pt at bedside; patient verbalizes understanding and agrees with treatment plan.  Labs Review Labs Reviewed  CBC - Abnormal; Notable for the following:    WBC 3.6 (*)    Hemoglobin 10.5 (*)    HCT 32.3 (*)    RDW 15.9 (*)    All other components within normal limits  COMPREHENSIVE METABOLIC PANEL - Abnormal; Notable for the following:    Calcium 8.8 (*)    ALT 10 (*)    Total Bilirubin 0.2 (*)    All other components within normal limits  TROPONIN I  TROPONIN I    Imaging Review Dg Chest 2 View  08/04/2015   CLINICAL DATA:  LEFT SIDED CHEST PAIN SINCE YESTERDAY, HTN, EVAL FOR PNEUMONIA  EXAM: CHEST  2 VIEW  COMPARISON:  08/02/2015  FINDINGS: Heart size is upper limits normal. There is calcification and prominence of the ascending portion of the thoracic aorta, raising the question of ascending thoracic aneurysm. The lungs are clear. No focal consolidations or pleural effusions. No pulmonary edema. Calcified granulomata are identified in the left lung. There is mid thoracic spondylosis.  IMPRESSION: 1.  No evidence for acute pulmonary abnormality. 2. Possible ascending thoracic aortic aneurysm. Consider further evaluation is CT of the chest. Contrast administration is recommended unless it is contraindicated.   Electronically Signed   By: Nolon Nations M.D.   On: 08/04/2015 12:46   Ct Angio Chest Aorta W/cm &/or Wo/cm  08/04/2015   CLINICAL DATA:  Intermittent chest pain over the past 2-3 days. Possible ascending aortic aneurysm by plain films the chest  EXAM: CT ANGIOGRAPHY CHEST WITH CONTRAST  TECHNIQUE: Multidetector CT imaging of the chest was performed using the standard protocol during bolus administration of intravenous contrast. Multiplanar CT image reconstructions and MIPs were obtained to  evaluate the vascular anatomy.  CONTRAST:  100 ml OMNIPAQUE IOHEXOL 350 MG/ML SOLN  COMPARISON:  PA and lateral chest 08/04/2015 and 08/02/2015.  FINDINGS: The ascending aorta is ectatic at 3.5 cm but no aneurysm is identified. There is no aortic dissection. Calcific atherosclerosis is seen. Heart size is enlarged. No pleural or pericardial effusion. Although not designed to evaluate for pulmonary embolus, no embolus is seen. Fluid is identified in a patulous esophagus. There is no axillary, hilar or mediastinal lymphadenopathy. Lungs demonstrate a small calcified granuloma in the left upper lobe but are otherwise unremarkable.  Imaged upper abdomen demonstrates no focal abnormality. No lytic or sclerotic bony lesion is seen.  Review of the MIP images confirms the above findings.  IMPRESSION: Ectasia of the ascending aorta without aneurysmal dilatation. Negative for aortic dissection.  Mild cardiomegaly.  Fluid in a patulous esophagus is compatible with esophageal dysmotility and/or reflux disease.  Electronically Signed   By: Inge Rise M.D.   On: 08/04/2015 14:21   I have personally reviewed and evaluated these images and lab results as part of my medical decision-making.   EKG Interpretation   Date/Time:  Friday August 04 2015 11:53:08 EDT Ventricular Rate:  74 PR Interval:  181 QRS Duration: 103 QT Interval:  415 QTC Calculation: 460 R Axis:   -32 Text Interpretation:  Sinus rhythm Left axis deviation Borderline T  abnormalities, lateral leads No significant change since last tracing  Confirmed by [facility] MD, Corene Cornea [phone]) on 08/04/2015 12:02:38 PM      MDM   Final diagnoses:  Chest pain, unspecified chest pain type  Esophageal dysmotility   81 year old female here with left sided sharp burning pain this morning after waking up. Resolved shortly after taking some nitroglycerin. Seen here 2 days ago for the same. At that time discussed with her cardiologist, Dr. Harl Bowie, who  thought probably to be GI and should follow-up in his office. Had a clean coronary catheterization in 2009. Symptoms today are similar to those. EKG is unchanged from 2 days ago. 2 troponins are negative. Chest x-ray questioned possible aortic aneurysms a CT done which showed evidence of reflux esophagitis also with ectasia of the aorta but no aneurysm or dissection. Talk to her cardiologist office and he did not have her schedule for an appointment yet so we scheduled an appointment for October 7 at 1:30. I told her about this point and she will go for further workup. She is already on a PPI since she will continue those as well.  I have personally and contemperaneously reviewed labs and imaging and used in my decision making as above.   A medical screening exam was performed and I feel the patient has had an appropriate workup for their chief complaint at this time and likelihood of emergent condition existing is low. They have been counseled on decision, discharge, follow up and which symptoms necessitate immediate return to the emergency department. They or their family verbally stated understanding and agreement with plan and discharged in stable condition.    I personally performed the services described in this documentation, which was scribed in my presence. The recorded information has been reviewed and is accurate.     Merrily Pew, MD 08/04/15 [phone]

## 2015-08-04 NOTE — ED Notes (Signed)
Pt c/o low to mid chest pain that started around 0900 this morning. Pt denies any nausea, weakness, SOB, radiation of pain, diaphoresis associated with the pain. Pt reports she took a Nitro and the pain went away. Pt then made herself breakfast and after eating she had "a weak spell come on". Pt denies any chest pain now, but does report continued weakness and "not feeling good". Pt reports she had a similar episode of this either yesterday or the day before.

## 2015-08-04 NOTE — ED Notes (Signed)
Seen here with chest pain 2 days ago.  Episode of chest pain yesterday that was relieved by ntg.  Episode this morning with chest pain that was also relieved by ntg.  But states she just feels "sickly"now.

## 2015-08-18 ENCOUNTER — Ambulatory Visit (INDEPENDENT_AMBULATORY_CARE_PROVIDER_SITE_OTHER): Payer: Medicare Other | Admitting: Adult Health

## 2015-08-18 ENCOUNTER — Encounter: Payer: Self-pay | Admitting: Adult Health

## 2015-08-18 VITALS — BP 140/88 | HR 74 | Ht 66.0 in | Wt 133.8 lb

## 2015-08-18 DIAGNOSIS — I2510 Atherosclerotic heart disease of native coronary artery without angina pectoris: Secondary | ICD-10-CM | POA: Diagnosis not present

## 2015-08-18 DIAGNOSIS — R531 Weakness: Secondary | ICD-10-CM

## 2015-08-18 DIAGNOSIS — I10 Essential (primary) hypertension: Secondary | ICD-10-CM | POA: Diagnosis not present

## 2015-08-18 MED ORDER — PANTOPRAZOLE SODIUM 40 MG PO TBEC: 40.0000 mg | DELAYED_RELEASE_TABLET | Freq: Every day | ORAL | Status: DC

## 2015-08-18 MED ORDER — NITROGLYCERIN 0.4 MG SL SUBL: 0.4000 mg | SUBLINGUAL_TABLET | SUBLINGUAL | Status: DC | PRN

## 2015-08-18 MED ORDER — ASPIRIN EC 81 MG PO TBEC: 81.0000 mg | DELAYED_RELEASE_TABLET | Freq: Every morning | ORAL | Status: DC

## 2015-08-18 MED ORDER — ATORVASTATIN CALCIUM 40 MG PO TABS: 40.0000 mg | ORAL_TABLET | Freq: Every day | ORAL | Status: DC

## 2015-08-18 MED ORDER — LISINOPRIL 20 MG PO TABS: 20.0000 mg | ORAL_TABLET | Freq: Every day | ORAL | Status: DC

## 2015-08-18 NOTE — Patient Instructions (Signed)
Your physician recommends that you schedule a follow-up appointment in: 3 Months  Your physician recommends that you continue on your current medications as directed. Please refer to the Current Medication list given to you today.  Thank you for choosing [facility]!

## 2015-08-18 NOTE — Progress Notes (Signed)
Cardiology Office Note   Date:  08/18/2015   ID:  Evelyn, Baird 02-22-1930, MRN [id number]  PCP:  Petra Kuba, MD  Cardiologist:  Cloria Spring, NP   Chief Complaint  Patient presents with  . Hypertension  . Coronary Artery Disease      History of Present Illness: Evelyn Baird is a 81 y.o. female who presents for ongoing assessment and management of hypertension and chest pain,non-obstructive CAD with most recent cardiac cath in 2009 via [facility] per office notes,. Lexiscan Myoview was completed in May of 2015 which was negative for ischemia, low risk study for major cardiac events with an EF of 56%. She was last seen in June of 2015 and was stable   She was again seen in the ER on 08/04/2015 for complaints of chest pain. ER spoke with Dr. Harl Bowie who stated, cardiac standpoint if troponin and repeat EKG are normal do not see indication for inpatient evaluation, she may f/u with [place] in 1-2 weeks as outpatient. If discharged from ER would consider trial of H2 blocker or PPI for possible GERD    She comes today feeling much better. Has some complaints of light-headedness and dizziness in the morning,   She admits to not eating in the morning only drinking coffee and feeling weak, tired, and dizzy afterwards because she does not eat until later in the afternoon.  She denies any chest pain, she denies any dyspnea.  Past Medical History  Diagnosis Date  . Essential hypertension, benign   . Hyperlipidemia   . Coronary atherosclerosis of native coronary artery     Nonobstructive 2009 - previously followed by [facility]  . Peripheral neuropathy ([place])   . Prediabetes   . PVC's (premature ventricular contractions)     Past Surgical History  Procedure Laterality Date  . Left abdominal hernia repair    . Hernia repair       Current Outpatient Prescriptions  Medication Sig Dispense Refill  . aspirin EC 81 MG tablet Take 1 tablet (81 mg total) by mouth every morning. 90 tablet 3   . atorvastatin (LIPITOR) 40 MG tablet Take 1 tablet (40 mg total) by mouth daily at 6 PM. 90 tablet 3  . lisinopril (PRINIVIL,ZESTRIL) 20 MG tablet Take 1 tablet (20 mg total) by mouth daily. 90 tablet 3  . nitroGLYCERIN (NITROSTAT) 0.4 MG SL tablet Place 1 tablet (0.4 mg total) under the tongue every 5 (five) minutes as needed. Chest pain. 25 tablet 3  . pantoprazole (PROTONIX) 40 MG tablet Take 1 tablet (40 mg total) by mouth daily. 90 tablet 3   No current facility-administered medications for this visit.    Allergies:   Beef-derived products and Pork-derived products    Social History:  The patient  reports that she has never smoked. She has never used smokeless tobacco. She reports that she does not drink alcohol or use illicit drugs.   Family History:  The patient's family history includes Hypertension in her father; Kidney disease in her father.    ROS: All other systems are reviewed and negative. Unless otherwise mentioned in H&P    PHYSICAL EXAM: VS:  BP 140/88 mmHg  Pulse 74  Ht 5\' 6"  (1.676 m)  Wt 133 lb 12.8 oz (60.691 kg)  BMI 21.61 kg/m2  SpO2 97% , BMI Body mass index is 21.61 kg/(m^2). GEN: Well nourished, well developed, in no acute distress HEENT: normal Neck: no JVD, carotid bruits, or masses Cardiac: RRR; 1/6 systolic murmurs,  rubs, or gallops,no edema  Respiratory:  clear to auscultation bilaterally, normal work of breathing GI: soft, nontender, nondistended, + BS MS: no deformity or atrophy Skin: warm and dry, no rash Neuro:  Strength and sensation are intact Psych: euthymic mood, full affect  Recent Labs: 08/02/2015: B Natriuretic Peptide 72.0 08/04/2015: ALT 10*; BUN 13; Creatinine, Ser 0.81; Hemoglobin 10.5*; Platelets 181; Potassium 4.1; Sodium 141    Lipid Panel    Component Value Date/Time   CHOL 232* 03/25/2014 0525   TRIG 89 03/25/2014 0525   HDL 80 03/25/2014 0525   CHOLHDL 2.9 03/25/2014 0525   VLDL 18 03/25/2014 0525   LDLCALC 134*  03/25/2014 0525      Wt Readings from Last 3 Encounters:  08/18/15 133 lb 12.8 oz (60.691 kg)  08/04/15 134 lb (60.782 kg)  08/02/15 134 lb (60.782 kg)     ASSESSMENT AND PLAN:  1.Hypertension:: blood pressure is well-controlled currently.  Will not make any changes in her medication.  She is given refills on antihypertensives.  2. Generalized weakness:we have talked about the fact that she needs eat breakfast each day and not just coffee.  She apparently does 90s to late afternoon and is often dizzy and tired in the mornings until she does eat.  She states that eating helps her to feel better, but it has been afraid to be gaining weight.  I have advised her to eat a nutritious breakfast every day to avoid these symptoms.  3. AoV stenosis: We will follow up with an echo at her next office visit. Currently she is not short of breath or having any chest pain.  Continue current medication regimen.  4. GERD:she is in place on a PPI by Dr.Branch.  She is advised to follow up with GI for recurrent symptoms.   Current medicines are reviewed at length with the patient today.    Labs/ tests ordered today include: none. No orders of the defined types were placed in this encounter.     Disposition:   FU with 6 months.  Signed, Jory Sims, NP  08/18/2015 3:43 PM    [facility] [address] [place], [place] [address] Phone: [phone]; Fax: [phone]

## 2015-08-18 NOTE — Progress Notes (Deleted)
Name: Evelyn Baird    DOB: [birth year]  Age: 81 y.o.  MR#: [id number]       PCP:  Petra Kuba, MD      Insurance: Payor: [employer] MEDICARE / Plan: [facility] MEDICARE / Product Type: *No Product type* /   CC:   No chief complaint on file.   VS Filed Vitals:   08/18/15 1343  BP: 140/88  Pulse: 74  Height: 5\' 6"  (1.676 m)  Weight: 133 lb 12.8 oz (60.691 kg)  SpO2: 97%    Weights Current Weight  08/18/15 133 lb 12.8 oz (60.691 kg)  08/04/15 134 lb (60.782 kg)  08/02/15 134 lb (60.782 kg)    Blood Pressure  BP Readings from Last 3 Encounters:  08/18/15 140/88  08/04/15 152/95  08/02/15 167/88     Admit date:  (Not on file) Last encounter with RMR:  Visit date not found   Allergy Beef-derived products and Pork-derived products  Current Outpatient Prescriptions  Medication Sig Dispense Refill  . aspirin EC 81 MG tablet Take 81 mg by mouth every morning.     Marland Kitchen atorvastatin (LIPITOR) 40 MG tablet Take 1 tablet (40 mg total) by mouth daily at 6 PM. 30 tablet 0  . lisinopril (PRINIVIL,ZESTRIL) 20 MG tablet Take 20 mg by mouth daily.     . nitroGLYCERIN (NITROSTAT) 0.4 MG SL tablet Place 0.4 mg under the tongue every 5 (five) minutes as needed. Chest pain.    . pantoprazole (PROTONIX) 20 MG tablet Take 2 tablets (40 mg total) by mouth daily. 30 tablet 0   No current facility-administered medications for this visit.    Discontinued Meds:   There are no discontinued medications.  Patient Active Problem List   Diagnosis Date Noted  . Esophageal dysmotility 06/20/2015  . Dysphagia 05/09/2015  . Chest pain 03/23/2014  . Anemia 03/23/2014  . Unspecified hereditary and idiopathic peripheral neuropathy 12/06/2013  . Preoperative evaluation to rule out surgical contraindication 01/05/2013  . Aortic valve disorders 04/03/2012  . HYPERLIPIDEMIA 10/15/2006  . Essential hypertension, benign 10/15/2006  . Coronary atherosclerosis of native coronary artery 10/15/2006  . GERD  10/15/2006    LABS    Component Value Date/Time   NA 141 08/04/2015 1225   NA 140 08/02/2015 1034   NA 141 02/27/2015 0946   K 4.1 08/04/2015 1225   K 4.4 08/02/2015 1034   K 4.4 02/27/2015 0946   CL 109 08/04/2015 1225   CL 109 08/02/2015 1034   CL 107 02/27/2015 0946   CO2 26 08/04/2015 1225   CO2 27 08/02/2015 1034   CO2 27 02/27/2015 0946   GLUCOSE 91 08/04/2015 1225   GLUCOSE 95 08/02/2015 1034   GLUCOSE 109* 02/27/2015 0946   BUN 13 08/04/2015 1225   BUN 12 08/02/2015 1034   BUN 18 02/27/2015 0946   CREATININE 0.81 08/04/2015 1225   CREATININE 0.73 08/02/2015 1034   CREATININE 0.73 02/27/2015 0946   CALCIUM 8.8* 08/04/2015 1225   CALCIUM 8.8* 08/02/2015 1034   CALCIUM 9.2 02/27/2015 0946   GFRNONAA >60 08/04/2015 1225   GFRNONAA >60 08/02/2015 1034   GFRNONAA 77* 02/27/2015 0946   GFRAA >60 08/04/2015 1225   GFRAA >60 08/02/2015 1034   GFRAA 89* 02/27/2015 0946   CMP     Component Value Date/Time   NA 141 08/04/2015 1225   K 4.1 08/04/2015 1225   CL 109 08/04/2015 1225   CO2 26 08/04/2015 1225   GLUCOSE 91 08/04/2015 1225  BUN 13 08/04/2015 1225   CREATININE 0.81 08/04/2015 1225   CALCIUM 8.8* 08/04/2015 1225   PROT 7.2 08/04/2015 1225   ALBUMIN 3.9 08/04/2015 1225   AST 22 08/04/2015 1225   ALT 10* 08/04/2015 1225   ALKPHOS 88 08/04/2015 1225   BILITOT 0.2* 08/04/2015 1225   GFRNONAA >60 08/04/2015 1225   GFRAA >60 08/04/2015 1225       Component Value Date/Time   WBC 3.6* 08/04/2015 1225   WBC 3.5* 08/02/2015 1034   WBC 4.2 02/27/2015 0946   HGB 10.5* 08/04/2015 1225   HGB 11.0* 08/02/2015 1034   HGB 11.0* 02/27/2015 0946   HCT 32.3* 08/04/2015 1225   HCT 33.9* 08/02/2015 1034   HCT 34.1* 02/27/2015 0946   MCV 80.1 08/04/2015 1225   MCV 80.0 08/02/2015 1034   MCV 82.0 02/27/2015 0946    Lipid Panel     Component Value Date/Time   CHOL 232* 03/25/2014 0525   TRIG 89 03/25/2014 0525   HDL 80 03/25/2014 0525   CHOLHDL 2.9 03/25/2014  0525   VLDL 18 03/25/2014 0525   LDLCALC 134* 03/25/2014 0525    ABG No results found for: PHART, PCO2ART, PO2ART, HCO3, TCO2, ACIDBASEDEF, O2SAT   Lab Results  Component Value Date   TSH 2.590 03/23/2014   BNP (last 3 results)  Recent Labs  08/02/15 1034  BNP 72.0    ProBNP (last 3 results) No results for input(s): PROBNP in the last 8760 hours.  Cardiac Panel (last 3 results) No results for input(s): CKTOTAL, CKMB, TROPONINI, RELINDX in the last 72 hours.  Iron/TIBC/Ferritin/ %Sat    Component Value Date/Time   IRON 39* 03/24/2014 0408   TIBC 335 03/24/2014 0408   FERRITIN 11 03/24/2014 0408   IRONPCTSAT 12* 03/24/2014 0408     EKG Orders placed or performed during the hospital encounter of 08/04/15  . EKG 12-Lead  . EKG 12-Lead  . EKG     Prior Assessment and Plan Problem List as of 08/18/2015      Cardiovascular and Mediastinum   Essential hypertension, benign   Last Assessment & Plan 04/12/2014 Office Visit Written 04/12/2014  1:45 PM by Lendon Colonel, NP    Excellent control of BP on current medication regimen. Will not make any changes. She will follow with PCP. Will see her in a year unless new symptoms occur.,      Coronary atherosclerosis of native coronary artery   Last Assessment & Plan 04/12/2014 Office Visit Written 04/12/2014  1:47 PM by Lendon Colonel, NP    Will continue current medication with medical management with statin, ASA. No further testing is planned at this time. See her in one year.      Aortic valve disorders   Last Assessment & Plan 04/12/2014 Office Visit Written 04/12/2014  1:47 PM by Lendon Colonel, NP    Follow up in one year unless symptomatic. Echo if this occurs, otherwise repeat in 2 years.         Digestive   GERD   Last Assessment & Plan 04/12/2014 Office Visit Written 04/12/2014  1:46 PM by Lendon Colonel, NP    Recommend GI evaluation for further treatment and recommendations for abdominal distention and gas.  Can take OTC anti-gas medications.       Dysphagia   Last Assessment & Plan 05/09/2015 Office Visit Edited 06/20/2015  8:31 AM by Carlis Stable, NP    81 year old female referred for dysphagia symptoms which mostly  includes foreign objects sensation or "lump in her throat "when swallowing. This is been occurring for 2-3 months. About one month ago she stopped taking her PPI does not want to take 1 any longer because it makes her feel sick. CT of the neck and soft tissues does not demonstrate any foreign object nor concerning lymphadenopathy. Study was essentially normal. Per PCP note and patient as well as family, patient does have occasional coughing after swallowing. At this point we will order a barium pill esophagram to further evaluate for stricture, web, ring as well as any dysmotility disorders and possible aspiration. Depending on the results of these were have her return in 6-8 weeks for follow-up and further recommendations.      Esophageal dysmotility   Last Assessment & Plan 06/20/2015 Office Visit Written 06/20/2015  9:17 AM by Carlis Stable, NP    Age-related esophageal dysmotility confirmed on diagnostic esophagram. Patient has been doing quite well with modified eating techniques and softer foods. Provided extensive education on eating mechanics, taking smaller bites, and using items such as gravies to soften foods. We'll provide additional written educational materials through the reference is section. We'll have her return as needed for any change in symptoms.        Nervous and Auditory   Unspecified hereditary and idiopathic peripheral neuropathy     Other   HYPERLIPIDEMIA   Last Assessment & Plan 04/03/2012 Office Visit Written 04/03/2012  9:57 AM by Satira Sark, MD    Recent lipid numbers reviewed, on Lipitor. Would ideally aim for LDL under 100.      Preoperative evaluation to rule out surgical contraindication   Last Assessment & Plan 01/05/2013 Office Visit Written 01/05/2013   2:05 PM by Satira Sark, MD    Patient to undergo cataract extraction on 3/6 at [facility]. From a cardiac perspective she has been clinically stable, previous history of nonobstructive CAD. Do not anticipate any further cardiac testing at this time. She should be able to proceed with an acceptable perioperative cardiovascular risk, overall low.      Chest pain   Anemia       Imaging: Dg Chest 2 View  08/04/2015   CLINICAL DATA:  LEFT SIDED CHEST PAIN SINCE YESTERDAY, HTN, EVAL FOR PNEUMONIA  EXAM: CHEST  2 VIEW  COMPARISON:  08/02/2015  FINDINGS: Heart size is upper limits normal. There is calcification and prominence of the ascending portion of the thoracic aorta, raising the question of ascending thoracic aneurysm. The lungs are clear. No focal consolidations or pleural effusions. No pulmonary edema. Calcified granulomata are identified in the left lung. There is mid thoracic spondylosis.  IMPRESSION: 1.  No evidence for acute pulmonary abnormality. 2. Possible ascending thoracic aortic aneurysm. Consider further evaluation is CT of the chest. Contrast administration is recommended unless it is contraindicated.   Electronically Signed   By: Nolon Nations M.D.   On: 08/04/2015 12:46   Dg Chest 2 View  08/02/2015   CLINICAL DATA:  Chest pain just left of the sternum since late last night.  EXAM: CHEST  2 VIEW  COMPARISON:  04/26/2014  FINDINGS: There is cardiomegaly. Tortuosity of the thoracic aorta with scattered calcifications. No confluent airspace opacities or effusions. Calcified granulomas in the upper lobes. Calcified right hilar lymph nodes. No acute bony abnormality.  IMPRESSION: Cardiomegaly.  No active disease.   Electronically Signed   By: Rolm Baptise M.D.   On: 08/02/2015 11:04   Ct Angio  Chest Aorta W/cm &/or Wo/cm  08/04/2015   CLINICAL DATA:  Intermittent chest pain over the past 2-3 days. Possible ascending aortic aneurysm by plain films the chest  EXAM: CT  ANGIOGRAPHY CHEST WITH CONTRAST  TECHNIQUE: Multidetector CT imaging of the chest was performed using the standard protocol during bolus administration of intravenous contrast. Multiplanar CT image reconstructions and MIPs were obtained to evaluate the vascular anatomy.  CONTRAST:  100 ml OMNIPAQUE IOHEXOL 350 MG/ML SOLN  COMPARISON:  PA and lateral chest 08/04/2015 and 08/02/2015.  FINDINGS: The ascending aorta is ectatic at 3.5 cm but no aneurysm is identified. There is no aortic dissection. Calcific atherosclerosis is seen. Heart size is enlarged. No pleural or pericardial effusion. Although not designed to evaluate for pulmonary embolus, no embolus is seen. Fluid is identified in a patulous esophagus. There is no axillary, hilar or mediastinal lymphadenopathy. Lungs demonstrate a small calcified granuloma in the left upper lobe but are otherwise unremarkable.  Imaged upper abdomen demonstrates no focal abnormality. No lytic or sclerotic bony lesion is seen.  Review of the MIP images confirms the above findings.  IMPRESSION: Ectasia of the ascending aorta without aneurysmal dilatation. Negative for aortic dissection.  Mild cardiomegaly.  Fluid in a patulous esophagus is compatible with esophageal dysmotility and/or reflux disease.   Electronically Signed   By: Inge Rise M.D.   On: 08/04/2015 14:21

## 2015-12-18 ENCOUNTER — Emergency Department (HOSPITAL_COMMUNITY): Payer: Medicare Other

## 2015-12-18 ENCOUNTER — Encounter (HOSPITAL_COMMUNITY): Payer: Self-pay | Admitting: *Deleted

## 2015-12-18 ENCOUNTER — Emergency Department (HOSPITAL_COMMUNITY)
Admission: EM | Admit: 2015-12-18 | Discharge: 2015-12-18 | Disposition: A | Discharge status: Home / Self Care | Payer: Medicare Other | Attending: Emergency Medicine | Admitting: Emergency Medicine

## 2015-12-18 DIAGNOSIS — I10 Essential (primary) hypertension: Secondary | ICD-10-CM | POA: Insufficient documentation

## 2015-12-18 DIAGNOSIS — Z9889 Other specified postprocedural states: Secondary | ICD-10-CM | POA: Diagnosis not present

## 2015-12-18 DIAGNOSIS — Z79899 Other long term (current) drug therapy: Secondary | ICD-10-CM | POA: Insufficient documentation

## 2015-12-18 DIAGNOSIS — K219 Gastro-esophageal reflux disease without esophagitis: Secondary | ICD-10-CM | POA: Insufficient documentation

## 2015-12-18 DIAGNOSIS — Z8669 Personal history of other diseases of the nervous system and sense organs: Secondary | ICD-10-CM | POA: Diagnosis not present

## 2015-12-18 DIAGNOSIS — Z8639 Personal history of other endocrine, nutritional and metabolic disease: Secondary | ICD-10-CM | POA: Diagnosis not present

## 2015-12-18 DIAGNOSIS — I2510 Atherosclerotic heart disease of native coronary artery without angina pectoris: Secondary | ICD-10-CM | POA: Insufficient documentation

## 2015-12-18 DIAGNOSIS — R1013 Epigastric pain: Secondary | ICD-10-CM | POA: Diagnosis present

## 2015-12-18 DIAGNOSIS — Z7982 Long term (current) use of aspirin: Secondary | ICD-10-CM | POA: Diagnosis not present

## 2015-12-18 LAB — COMPREHENSIVE METABOLIC PANEL
ALBUMIN: 4.2 g/dL (ref 3.5–5.0)
ALT: 13 U/L — ABNORMAL LOW (ref 14–54)
ANION GAP: 8 (ref 5–15)
AST: 21 U/L (ref 15–41)
Alkaline Phosphatase: 88 U/L (ref 38–126)
BUN: 13 mg/dL (ref 6–20)
CHLORIDE: 105 mmol/L (ref 101–111)
CO2: 27 mmol/L (ref 22–32)
Calcium: 9.4 mg/dL (ref 8.9–10.3)
Creatinine, Ser: 0.86 mg/dL (ref 0.44–1.00)
GFR calc non Af Amer: >60 mL/min (ref >60)
Glucose, Bld: 92 mg/dL (ref 65–99)
POTASSIUM: 4 mmol/L (ref 3.5–5.1)
SODIUM: 140 mmol/L (ref 135–145)
TOTAL PROTEIN: 7.9 g/dL (ref 6.5–8.1)
Total Bilirubin: 0.2 mg/dL — ABNORMAL LOW (ref 0.3–1.2)

## 2015-12-18 LAB — CBC WITH DIFFERENTIAL/PLATELET
BASOS PCT: 0 %
Basophils Absolute: 0 10*3/uL (ref 0.0–0.1)
EOS ABS: 0 10*3/uL (ref 0.0–0.7)
EOS PCT: 1 %
HCT: 31.3 % — ABNORMAL LOW (ref 36.0–46.0)
Hemoglobin: 10.1 g/dL — ABNORMAL LOW (ref 12.0–15.0)
LYMPHS ABS: 2.2 10*3/uL (ref 0.7–4.0)
Lymphocytes Relative: 49 %
MCH: 24.6 pg — AB (ref 26.0–34.0)
MCHC: 32.3 g/dL (ref 30.0–36.0)
MCV: 76.3 fL — ABNORMAL LOW (ref 78.0–100.0)
MONOS PCT: 6 %
Monocytes Absolute: 0.3 10*3/uL (ref 0.1–1.0)
Neutro Abs: 2 10*3/uL (ref 1.7–7.7)
Neutrophils Relative %: 44 %
PLATELETS: 196 10*3/uL (ref 150–400)
RBC: 4.1 MIL/uL (ref 3.87–5.11)
RDW: 16.2 % — ABNORMAL HIGH (ref 11.5–15.5)
WBC: 4.5 10*3/uL (ref 4.0–10.5)

## 2015-12-18 LAB — TROPONIN I

## 2015-12-18 MED ORDER — SUCRALFATE 1 GM/10ML PO SUSP: 1.0000 g | Freq: Three times a day (TID) | ORAL | Status: DC

## 2015-12-18 MED ORDER — ONDANSETRON 4 MG PO TBDP: ORAL_TABLET | ORAL | Status: DC

## 2015-12-18 NOTE — ED Provider Notes (Signed)
CSN: [id number]     Arrival date & time 12/18/15  1438 History   First MD Initiated Contact with Patient 12/18/15 [place]     Chief Complaint  Patient presents with  . Weakness     (Consider location/radiation/quality/duration/timing/severity/associated sxs/prior Treatment) Patient is a 82 y.o. female presenting with abdominal pain. The history is provided by the patient (Patient complains of epigastric abdominal pain. Belching. She's taken some antacids that's helped).  Abdominal Pain Pain location:  Epigastric Pain quality: aching   Pain radiates to:  Does not radiate Pain severity:  Mild Onset quality:  Sudden Timing:  Intermittent Progression:  Waxing and waning Chronicity:  New Associated symptoms: no chest pain, no cough, no diarrhea, no fatigue and no hematuria     Past Medical History  Diagnosis Date  . Essential hypertension, benign   . Hyperlipidemia   . Coronary atherosclerosis of native coronary artery     Nonobstructive 2009 - previously followed by [facility]  . Peripheral neuropathy ([place])   . Prediabetes   . PVC's (premature ventricular contractions)    Past Surgical History  Procedure Laterality Date  . Left abdominal hernia repair    . Hernia repair     Family History  Problem Relation Age of Onset  . Hypertension Father   . Kidney disease Father    Social History  Substance Use Topics  . Smoking status: Never Smoker   . Smokeless tobacco: Never Used     Comment: Never smoked  . Alcohol Use: No   OB History    Gravida Para Term Preterm AB TAB SAB Ectopic Multiple Living   7 7 7       6      Review of Systems  Constitutional: Negative for appetite change and fatigue.  HENT: Negative for congestion, ear discharge and sinus pressure.   Eyes: Negative for discharge.  Respiratory: Negative for cough.   Cardiovascular: Negative for chest pain.  Gastrointestinal: Positive for abdominal pain. Negative for diarrhea.  Genitourinary: Negative for frequency and  hematuria.  Musculoskeletal: Negative for back pain.  Skin: Negative for rash.  Neurological: Negative for seizures and headaches.  Psychiatric/Behavioral: Negative for hallucinations.      Allergies  Beef-derived products and Pork-derived products  Home Medications   Prior to Admission medications   Medication Sig Start Date End Date Taking? Authorizing Provider  aspirin EC 81 MG tablet Take 1 tablet (81 mg total) by mouth every morning. 08/18/15  Yes Lendon Colonel, NP  lisinopril (PRINIVIL,ZESTRIL) 20 MG tablet Take 1 tablet (20 mg total) by mouth daily. 08/18/15  Yes Lendon Colonel, NP  nitroGLYCERIN (NITROSTAT) 0.4 MG SL tablet Place 1 tablet (0.4 mg total) under the tongue every 5 (five) minutes as needed. Chest pain. 08/18/15  Yes Lendon Colonel, NP  pantoprazole (PROTONIX) 40 MG tablet Take 1 tablet (40 mg total) by mouth daily. 08/18/15  Yes Lendon Colonel, NP  atorvastatin (LIPITOR) 40 MG tablet Take 1 tablet (40 mg total) by mouth daily at 6 PM. Patient not taking: Reported on 12/18/2015 08/18/15   Lendon Colonel, NP  ondansetron (ZOFRAN ODT) 4 MG disintegrating tablet 4mg  ODT q4 hours prn nausea/vomit 12/18/15   Milton Ferguson, MD  sucralfate (CARAFATE) 1 GM/10ML suspension Take 10 mLs (1 g total) by mouth 4 (four) times daily -  with meals and at bedtime. 12/18/15   Milton Ferguson, MD   BP 132/64 mmHg  Pulse 59  Temp(Src) 99.6 F (37.6 C) (Temporal)  Resp 18  Ht 5\' 7"  (1.702 m)  Wt 133 lb (60.328 kg)  BMI 20.83 kg/m2  SpO2 96% Physical Exam  Constitutional: She is oriented to person, place, and time. She appears well-developed.  HENT:  Head: Normocephalic.  Eyes: Conjunctivae and EOM are normal. No scleral icterus.  Neck: Neck supple. No thyromegaly present.  Cardiovascular: Normal rate and regular rhythm.  Exam reveals no gallop and no friction rub.   No murmur heard. Pulmonary/Chest: No stridor. She has no wheezes. She has no rales. She exhibits no  tenderness.  Abdominal: She exhibits no distension. There is tenderness. There is no rebound.  Mild tender epigastric area  Musculoskeletal: Normal range of motion. She exhibits no edema.  Lymphadenopathy:    She has no cervical adenopathy.  Neurological: She is oriented to person, place, and time. She exhibits normal muscle tone. Coordination normal.  Skin: No rash noted. No erythema.  Psychiatric: She has a normal mood and affect. Her behavior is normal.    ED Course  Procedures (including critical care time) Labs Review Labs Reviewed  CBC WITH DIFFERENTIAL/PLATELET - Abnormal; Notable for the following:    Hemoglobin 10.1 (*)    HCT 31.3 (*)    MCV 76.3 (*)    MCH 24.6 (*)    RDW 16.2 (*)    All other components within normal limits  COMPREHENSIVE METABOLIC PANEL - Abnormal; Notable for the following:    ALT 13 (*)    Total Bilirubin 0.2 (*)    All other components within normal limits  TROPONIN I    Imaging Review Dg Chest 2 View  12/18/2015  CLINICAL DATA:  Weakness, chest burning and excessive belching. Left lower leg pain today. EXAM: CHEST  2 VIEW COMPARISON:  CT and radiographs 08/04/2015) FINDINGS: The heart size and mediastinal contours are stable with mild cardiomegaly, aortic tortuosity and intramural calcification within the ascending aorta. There is a stable calcified left upper lobe granuloma. The lungs are otherwise clear. There is no pleural effusion or pneumothorax. The bones appear unchanged. IMPRESSION: Stable radiographic appearance of the chest. No acute findings identified. Electronically Signed   By: Richardean Sale M.D.   On: 12/18/2015 19:32   Dg Tibia/fibula Left  12/18/2015  CLINICAL DATA:  82 year old female with left lower leg pain. No injury. EXAM: LEFT TIBIA AND FIBULA - 2 VIEW COMPARISON:  None. FINDINGS: No acute fracture or dislocation. The bones are osteopenic. There are osteoarthritic changes of the knee with tricompartmental narrowing. The soft  tissues are grossly unremarkable. IMPRESSION: No acute osseous pathology. Electronically Signed   By: Anner Crete M.D.   On: 12/18/2015 19:27   I have personally reviewed and evaluated these images and lab results as part of my medical decision-making.   EKG Interpretation None      MDM   Final diagnoses:  GERD without esophagitis    Labs EKG chest x-ray unremarkable. Patient will be given Carafate and told to follow-up her PCP    Milton Ferguson, MD 12/18/15 2056

## 2015-12-18 NOTE — ED Notes (Signed)
Pt reports a "sick" feeling yesterday in which she felt weak that is sporadic.

## 2015-12-18 NOTE — ED Notes (Signed)
Pt alert & oriented x4, stable gait. Patient given discharge instructions, paperwork & prescription(s). Patient  instructed to stop at the registration desk to finish any additional paperwork. Patient verbalized understanding. Pt left department w/ no further questions. 

## 2015-12-18 NOTE — Discharge Instructions (Signed)
Follow up with your md later this week. °

## 2016-01-11 ENCOUNTER — Encounter: Payer: Self-pay | Admitting: Cardiology

## 2016-01-11 ENCOUNTER — Encounter: Payer: Medicare Other | Admitting: Cardiology

## 2016-01-11 NOTE — Progress Notes (Signed)
Patient ID: Evelyn Baird, female   DOB: 11/07/1930, 82 y.o.   MRN: [id number]   Encounter opened in error

## 2016-04-12 ENCOUNTER — Ambulatory Visit: Payer: Self-pay | Admitting: Family Medicine

## 2016-04-30 ENCOUNTER — Encounter: Payer: Self-pay | Admitting: Family Medicine

## 2016-04-30 ENCOUNTER — Ambulatory Visit (INDEPENDENT_AMBULATORY_CARE_PROVIDER_SITE_OTHER): Payer: Medicare Other | Admitting: Family Medicine

## 2016-04-30 VITALS — BP 132/78 | HR 72 | Temp 99.5°F | Resp 14 | Ht 62.0 in | Wt 133.0 lb

## 2016-04-30 DIAGNOSIS — I2510 Atherosclerotic heart disease of native coronary artery without angina pectoris: Secondary | ICD-10-CM

## 2016-04-30 DIAGNOSIS — E785 Hyperlipidemia, unspecified: Secondary | ICD-10-CM

## 2016-04-30 DIAGNOSIS — I10 Essential (primary) hypertension: Secondary | ICD-10-CM

## 2016-04-30 DIAGNOSIS — K219 Gastro-esophageal reflux disease without esophagitis: Secondary | ICD-10-CM

## 2016-04-30 DIAGNOSIS — G609 Hereditary and idiopathic neuropathy, unspecified: Secondary | ICD-10-CM | POA: Diagnosis not present

## 2016-04-30 DIAGNOSIS — R609 Edema, unspecified: Secondary | ICD-10-CM

## 2016-04-30 DIAGNOSIS — K224 Dyskinesia of esophagus: Secondary | ICD-10-CM | POA: Diagnosis not present

## 2016-04-30 MED ORDER — ATORVASTATIN CALCIUM 10 MG PO TABS: 10.0000 mg | ORAL_TABLET | Freq: Every day | ORAL | Status: DC

## 2016-04-30 NOTE — Progress Notes (Signed)
Patient ID: Evelyn Baird, female   DOB: 08-09-1930, 82 y.o.   MRN: [id number]   Subjective:    Patient ID: Evelyn Baird, female    DOB: 1930/08/07, 82 y.o.   MRN: [id number]  Patient presents for New Patient CPE Patient here to establish care. Her previous primary care provider was Dr. Lennox Grumbles - [facility]  Her medical history and medications were reviewed History of coronary artery disease hyperlipidemia hypertension she is followed by Dr. Domenic Polite cardiology. She has nonobstructive coronary artery disease her last catheterization was in 2009 she also had a stress Myoview in 2015 which was negative for ischemia with fairly well preserved ejection fraction of 56%.  She has acid reflux which she has been to the emergency room for a few time she's currently On Protonix. She's no longer on Carafate. She was seen by gastroenterology about a year ago that time she was diagnosed with esophageal dysmotility . She complains of dysphagia. She did have esophagram done. She was given a mechanical 3 soft diet which I'm not quite sure she has been following she does continue to have some problems with feeling a lump in her throat at times after eating. She has also a history of anemia she has declined colonoscopy. She actually had labs done with a different physician that she went to at [facility] 1 week ago because her left leg was hurting I do not have results of these last a day.  Left leg pain she has some swelling in her ankle she is known peripheral neuropathy which she was on aware of this diagnosis. She was tried on gabapentin in the past that seemed to have some side effects that she stopped the medication. Is then recommended that she is compression hoses however she does not have a prescription for these. She tries to stay active in her home that she does get back pain and joint pain from time to time.  She lives by herself. She does not drive. Her children live around her.  She does do her own medications and is known for stopping her medicine which she does not feel good.  Immunizations UTD  Review Of Systems:  GEN- denies fatigue, fever, weight loss,weakness, recent illness HEENT- denies eye drainage, change in vision, nasal discharge, CVS- denies chest pain, palpitations RESP- denies SOB, cough, wheeze ABD- denies N/V, change in stools, abd pain GU- denies dysuria, hematuria, dribbling, incontinence MSK- + joint pain, muscle aches, injury Neuro- denies headache, dizziness, syncope, seizure activity       Objective:    BP 132/78 mmHg  Pulse 72  Temp(Src) 99.5 F (37.5 C) (Oral)  Resp 14  Ht 5\' 2"  (1.575 m)  Wt 133 lb (60.328 kg)  BMI 24.32 kg/m2 GEN- NAD, alert and oriented x3, no walking assistive device  HEENT- PERRL, EOMI, non injected sclera, pink conjunctiva, MMM, oropharynx clear Neck- Supple, no thyromegaly CVS- RRR, no murmur RESP-RRR, 2/6 SEM RSB ABD-NABS,soft,NT,ND EXT-+ non pitting edema L pre tibial to foot >R, venous stasis changes, mild hyperpigmentation of skin, no hair seen  Pulses- Radial 2+ DP- decreased bilat +        Assessment & Plan:      Problem List Items Addressed This Visit    Peripheral edema   Hyperlipidemia   Relevant Medications   atorvastatin (LIPITOR) 10 MG tablet   Hereditary and idiopathic peripheral neuropathy - Primary    She has signs of peripheral vascular Almyra Free disease as well  as peripheral neuropathy. We will try compression hose she is very wary of taking more medications concerned that she'll stop them or have side effects.      GERD   Essential hypertension, benign    Blood pressure control medication medication      Relevant Medications   atorvastatin (LIPITOR) 10 MG tablet   Esophageal dysmotility    Discussed the appointed dietary adherence she does not want to have any scoping done. I gave her handout on epigastric neurologist recommendations for mechanical 3 soft diet       Coronary atherosclerosis of native coronary artery    Reviewed cardiology know. We'll try to keep her risk factors under control with blood pressure. We will restart Lipitor at bedtime as well. I will obtain the labs from this physician that she saw a week ago      Relevant Medications   atorvastatin (LIPITOR) 10 MG tablet      Note: This dictation was prepared with Dragon dictation along with smaller phrase technology. Any transcriptional errors that result from this process are unintentional.

## 2016-04-30 NOTE — Patient Instructions (Addendum)
Get the compression hose  Take Lipitor 10mg  at bedtime Use tylenol for back pain Release of records- [facility]- need LABS Follow the special diet for your swallowing F/U 3 months

## 2016-04-30 NOTE — Assessment & Plan Note (Signed)
Discussed the appointed dietary adherence she does not want to have any scoping done. I gave her handout on epigastric neurologist recommendations for mechanical 3 soft diet

## 2016-04-30 NOTE — Assessment & Plan Note (Signed)
Reviewed cardiology know. We'll try to keep her risk factors under control with blood pressure. We will restart Lipitor at bedtime as well. I will obtain the labs from this physician that she saw a week ago

## 2016-04-30 NOTE — Assessment & Plan Note (Signed)
Blood pressure control medication medication 

## 2016-04-30 NOTE — Assessment & Plan Note (Signed)
She has signs of peripheral vascular Almyra Free disease as well as peripheral neuropathy. We will try compression hose she is very wary of taking more medications concerned that she'll stop them or have side effects.

## 2016-05-03 ENCOUNTER — Telehealth: Payer: Self-pay | Admitting: *Deleted

## 2016-05-03 ENCOUNTER — Other Ambulatory Visit: Payer: Self-pay | Admitting: *Deleted

## 2016-05-03 DIAGNOSIS — D649 Anemia, unspecified: Secondary | ICD-10-CM

## 2016-05-03 NOTE — Telephone Encounter (Signed)
Received patient lab results.   MD reviewed and noted RBC at 8.4.  New orders obtained to have patient return to office on Monday 05/06/2016 for repeat labs: CBC CMP Iron, TIBC, Ferritin Vit B12  Call placed to patient daughter, Janeice Robinson. [place].

## 2016-05-06 NOTE — Telephone Encounter (Signed)
Patient daughter Janeice Robinson returned call and made aware.   Will bring patient in for labs on 05/07/2016.

## 2016-05-07 ENCOUNTER — Other Ambulatory Visit: Payer: Medicare Other

## 2016-05-07 DIAGNOSIS — D649 Anemia, unspecified: Secondary | ICD-10-CM

## 2016-05-07 LAB — COMPLETE METABOLIC PANEL WITH GFR
ALT: 10 U/L (ref 6–29)
AST: 20 U/L (ref 10–35)
Albumin: 3.9 g/dL (ref 3.6–5.1)
Alkaline Phosphatase: 90 U/L (ref 33–130)
BILIRUBIN TOTAL: 0.3 mg/dL (ref 0.2–1.2)
BUN: 12 mg/dL (ref 7–25)
CO2: 22 mmol/L (ref 20–31)
Calcium: 9.5 mg/dL (ref 8.6–10.4)
Chloride: 104 mmol/L (ref 98–110)
Creat: 0.76 mg/dL (ref 0.60–0.88)
GFR, EST NON AFRICAN AMERICAN: 72 mL/min (ref >=60)
GFR, Est African American: 83 mL/min (ref >=60)
GLUCOSE: 93 mg/dL (ref 70–99)
Potassium: 4.4 mmol/L (ref 3.5–5.3)
SODIUM: 140 mmol/L (ref 135–146)
TOTAL PROTEIN: 7.4 g/dL (ref 6.1–8.1)

## 2016-05-07 LAB — CBC WITH DIFFERENTIAL/PLATELET
BASOS PCT: 1 %
Basophils Absolute: 34 cells/uL (ref 0–200)
EOS PCT: 1 %
Eosinophils Absolute: 34 cells/uL (ref 15–500)
HCT: 27.8 % — ABNORMAL LOW (ref 35.0–45.0)
Hemoglobin: 8.5 g/dL — CL (ref 12.0–15.0)
Lymphocytes Relative: 29 %
Lymphs Abs: 986 cells/uL (ref 850–3900)
MCH: 21.8 pg — AB (ref 27.0–33.0)
MCHC: 30.6 g/dL — ABNORMAL LOW (ref 32.0–36.0)
MCV: 71.3 fL — ABNORMAL LOW (ref 80.0–100.0)
MONOS PCT: 6 %
MPV: 9.5 fL (ref 7.5–12.5)
Monocytes Absolute: 204 cells/uL (ref 200–950)
NEUTROS ABS: 2142 {cells}/uL (ref 1500–7800)
Neutrophils Relative %: 63 %
PLATELETS: 277 10*3/uL (ref 140–400)
RBC: 3.9 MIL/uL (ref 3.80–5.10)
RDW: 18.5 % — ABNORMAL HIGH (ref 11.0–15.0)
WBC: 3.4 10*3/uL — AB (ref 3.8–10.8)

## 2016-05-07 LAB — VITAMIN B12: Vitamin B-12: 511 pg/mL (ref 200–1100)

## 2016-05-07 LAB — IRON,TIBC AND FERRITIN PANEL
%SAT: 4 % — ABNORMAL LOW (ref 11–50)
Ferritin: 12 ng/mL — ABNORMAL LOW (ref 20–288)
Iron: 16 ug/dL — ABNORMAL LOW (ref 45–160)
TIBC: 415 ug/dL (ref 250–450)

## 2016-05-12 ENCOUNTER — Emergency Department: Payer: Medicare Other

## 2016-05-12 ENCOUNTER — Emergency Department
Admission: EM | Admit: 2016-05-12 | Discharge: 2016-05-13 | Disposition: A | Discharge status: Home / Self Care | Payer: Medicare Other | Attending: Emergency Medicine | Admitting: Emergency Medicine

## 2016-05-12 ENCOUNTER — Encounter: Payer: Self-pay | Admitting: Emergency Medicine

## 2016-05-12 DIAGNOSIS — E119 Type 2 diabetes mellitus without complications: Secondary | ICD-10-CM | POA: Insufficient documentation

## 2016-05-12 DIAGNOSIS — Z7982 Long term (current) use of aspirin: Secondary | ICD-10-CM | POA: Insufficient documentation

## 2016-05-12 DIAGNOSIS — R109 Unspecified abdominal pain: Secondary | ICD-10-CM

## 2016-05-12 DIAGNOSIS — I10 Essential (primary) hypertension: Secondary | ICD-10-CM | POA: Insufficient documentation

## 2016-05-12 DIAGNOSIS — I2510 Atherosclerotic heart disease of native coronary artery without angina pectoris: Secondary | ICD-10-CM | POA: Insufficient documentation

## 2016-05-12 DIAGNOSIS — E785 Hyperlipidemia, unspecified: Secondary | ICD-10-CM | POA: Diagnosis not present

## 2016-05-12 DIAGNOSIS — R1032 Left lower quadrant pain: Secondary | ICD-10-CM | POA: Insufficient documentation

## 2016-05-12 HISTORY — DX: Type 2 diabetes mellitus without complications: E11.9

## 2016-05-12 LAB — URINALYSIS COMPLETE WITH MICROSCOPIC (ARMC ONLY)
BILIRUBIN URINE: NEGATIVE
GLUCOSE, UA: NEGATIVE mg/dL
Hgb urine dipstick: NEGATIVE
KETONES UR: NEGATIVE mg/dL
Leukocytes, UA: NEGATIVE
NITRITE: NEGATIVE
Protein, ur: NEGATIVE mg/dL
Specific Gravity, Urine: 1.009 (ref 1.005–1.030)
pH: 7 (ref 5.0–8.0)

## 2016-05-12 LAB — COMPREHENSIVE METABOLIC PANEL
ALBUMIN: 3.6 g/dL (ref 3.5–5.0)
ALT: 12 U/L — ABNORMAL LOW (ref 14–54)
ANION GAP: 5 (ref 5–15)
AST: 22 U/L (ref 15–41)
Alkaline Phosphatase: 82 U/L (ref 38–126)
BILIRUBIN TOTAL: 0.3 mg/dL (ref 0.3–1.2)
BUN: 18 mg/dL (ref 6–20)
CALCIUM: 8.6 mg/dL — AB (ref 8.9–10.3)
CO2: 24 mmol/L (ref 22–32)
CREATININE: 0.86 mg/dL (ref 0.44–1.00)
Chloride: 108 mmol/L (ref 101–111)
GFR calc Af Amer: >60 mL/min (ref >60)
GFR calc non Af Amer: >60 mL/min (ref >60)
GLUCOSE: 100 mg/dL — AB (ref 65–99)
Potassium: 3.8 mmol/L (ref 3.5–5.1)
Sodium: 137 mmol/L (ref 135–145)
TOTAL PROTEIN: 7 g/dL (ref 6.5–8.1)

## 2016-05-12 LAB — CBC WITH DIFFERENTIAL/PLATELET
BASOS PCT: 1 %
Basophils Absolute: 0 10*3/uL (ref 0–0.1)
EOS ABS: 0 10*3/uL (ref 0–0.7)
EOS PCT: 1 %
HEMATOCRIT: 24.6 % — AB (ref 35.0–47.0)
Hemoglobin: 7.9 g/dL — ABNORMAL LOW (ref 12.0–16.0)
Lymphocytes Relative: 25 %
Lymphs Abs: 0.9 10*3/uL — ABNORMAL LOW (ref 1.0–3.6)
MCH: 22.5 pg — ABNORMAL LOW (ref 26.0–34.0)
MCHC: 32.2 g/dL (ref 32.0–36.0)
MCV: 69.8 fL — ABNORMAL LOW (ref 80.0–100.0)
MONO ABS: 0.3 10*3/uL (ref 0.2–0.9)
MONOS PCT: 8 %
NEUTROS ABS: 2.5 10*3/uL (ref 1.4–6.5)
Neutrophils Relative %: 65 %
PLATELETS: 192 10*3/uL (ref 150–440)
RBC: 3.52 MIL/uL — ABNORMAL LOW (ref 3.80–5.20)
RDW: 18.8 % — AB (ref 11.5–14.5)
WBC: 3.7 10*3/uL (ref 3.6–11.0)

## 2016-05-12 LAB — LIPASE, BLOOD: Lipase: 44 U/L (ref 11–51)

## 2016-05-12 MED ORDER — SODIUM CHLORIDE 0.9 % IV BOLUS (SEPSIS)
250.0000 mL | Freq: Once | INTRAVENOUS | Status: AC
  Administered 2016-05-12: 250 mL via INTRAVENOUS

## 2016-05-12 MED ORDER — MORPHINE SULFATE (PF) 2 MG/ML IV SOLN
2.0000 mg | Freq: Once | INTRAVENOUS | Status: AC
  Administered 2016-05-12: 2 mg via INTRAVENOUS
  Filled 2016-05-12: qty 1

## 2016-05-12 MED ORDER — MORPHINE SULFATE (PF) 2 MG/ML IV SOLN: 2.0000 mg | Freq: Once | INTRAVENOUS | Status: DC

## 2016-05-12 NOTE — ED Notes (Signed)
Pt. Reports pain in the lower left abdomen that is radiating to the left flank that began this morning and has been gradually increasing throughout the day. Pt. Reports the pain as a nagging 3/10, but son states pain was bad enough to effect pt. Walking.

## 2016-05-12 NOTE — ED Notes (Signed)
Patient transported to CT 

## 2016-05-12 NOTE — ED Provider Notes (Signed)
Time Seen: Approximately 2200  I have reviewed the triage notes  Chief Complaint: Abdominal Pain and Flank Pain   History of Present Illness: SHAYONA LAVOIE is a 82 y.o. female who presents with acute onset of some left lower quadrant abdominal pain that she noticed this morning. Patient states the pain comes and goes and radiates up toward her left flank region. She denies any fever, dysuria, hematuria. She states she hasn't had any bowel movement today but doesn't feel particularly constipated. She denies any right-sided abdominal pain. At anything for pain prior to arrival and denies any nausea or vomiting. Patient states that she has had previous abdominal surgery for a hernia. She denies any recent surgeries.   Past Medical History  Diagnosis Date  . Essential hypertension, benign   . Hyperlipidemia   . Coronary atherosclerosis of native coronary artery     Nonobstructive 2009 - previously followed by [facility]  . Peripheral neuropathy ([place])   . Prediabetes   . PVC's (premature ventricular contractions)     Patient Active Problem List   Diagnosis Date Noted  . Peripheral edema 04/30/2016  . Esophageal dysmotility 06/20/2015  . Dysphagia 05/09/2015  . Anemia 03/23/2014  . Hereditary and idiopathic peripheral neuropathy 12/06/2013  . Aortic valve disorders 04/03/2012  . Hyperlipidemia 10/15/2006  . Essential hypertension, benign 10/15/2006  . Coronary atherosclerosis of native coronary artery 10/15/2006  . GERD 10/15/2006    Past Surgical History  Procedure Laterality Date  . Left abdominal hernia repair    . Hernia repair      Past Surgical History  Procedure Laterality Date  . Left abdominal hernia repair    . Hernia repair      Current Outpatient Rx  Name  Route  Sig  Dispense  Refill  . aspirin EC 81 MG tablet   Oral   Take 1 tablet (81 mg total) by mouth every morning.   90 tablet   3   . atorvastatin (LIPITOR) 10 MG tablet   Oral   Take 1 tablet (10 mg  total) by mouth at bedtime. FOR CHOLESTEROL   90 tablet   3   . lisinopril (PRINIVIL,ZESTRIL) 20 MG tablet   Oral   Take 1 tablet (20 mg total) by mouth daily.   90 tablet   3   . nitroGLYCERIN (NITROSTAT) 0.4 MG SL tablet   Sublingual   Place 1 tablet (0.4 mg total) under the tongue every 5 (five) minutes as needed. Chest pain.   25 tablet   3   . pantoprazole (PROTONIX) 40 MG tablet   Oral   Take 1 tablet (40 mg total) by mouth daily.   90 tablet   3     Allergies:  Beef-derived products and Pork-derived products  Family History: Family History  Problem Relation Age of Onset  . Hypertension Father   . Kidney disease Father     Social History: Social History  Substance Use Topics  . Smoking status: Never Smoker   . Smokeless tobacco: Never Used     Comment: Never smoked  . Alcohol Use: No     Review of Systems:   10 point review of systems was performed and was otherwise negative:  Constitutional: No fever Eyes: No visual disturbances ENT: No sore throat, ear pain Cardiac: No chest pain Respiratory: No shortness of breath, wheezing, or stridor Abdomen: No abdominal pain, no vomiting, No diarrhea Endocrine: No weight loss, No night sweats Extremities: Patient describes some swelling  in her left lower extremity and points mainly to the left calf area. She states she went to an urgent care and had a "" blood tests "" which did not show any clots and she scheduled follow-up. She states she still has the pain and swelling and has been occurring over the last 2 weeks. She denies any direct trauma. Skin: No rashes, easy bruising Neurologic: No focal weakness, trouble with speech or swollowing Urologic: No dysuria, Hematuria, or urinary frequency *  Physical Exam:  ED Triage Vitals  Enc Vitals Group     BP 05/12/16 2129 101/58 mmHg     Pulse Rate 05/12/16 2129 85     Resp 05/12/16 2129 16     Temp 05/12/16 2129 98 F (36.7 C)     Temp Source 05/12/16 2129  Oral     SpO2 05/12/16 2129 97 %     Weight 05/12/16 2129 134 lb (60.782 kg)     Height 05/12/16 2129 5\' 3"  (1.6 m)     Head Cir --      Peak Flow --      Pain Score --      Pain Loc --      Pain Edu? --      Excl. in [place]? --     General: Awake , Alert , and Oriented times 3; GCS 15 No apparent distress Head: Normal cephalic , atraumatic Eyes: Pupils equal , round, reactive to light Nose/Throat: No nasal drainage, patent upper airway without erythema or exudate.  Neck: Supple, Full range of motion, No anterior adenopathy or palpable thyroid masses Lungs: Clear to ascultation without wheezes , rhonchi, or rales Heart: Regular rate, regular rhythm without murmurs , gallops , or rubs Abdomen: Soft, non tender without rebound, guarding , or rigidity; bowel sounds positive and symmetric in all 4 quadrants. No organomegaly .   No obvious palpable masses or reproducible discomfort     Extremities: 2 plus symmetric pulses. Mild circumferential swelling in the left calf compared to the right. No clubbing or cyanosis is noted. Neurologic: normal ambulation, Motor symmetric without deficits, sensory intact Skin: warm, dry, no rashes   Labs:   All laboratory work was reviewed including any pertinent negatives or positives listed below:  Labs Reviewed  CBC WITH DIFFERENTIAL/PLATELET  COMPREHENSIVE METABOLIC PANEL  URINALYSIS [place] ([place])     Radiology:     US Venous Img Lower Unilateral Left (Final result) Result time: 05/12/16 22:53:15   Final result by Rad Results In Interface (05/12/16 22:53:15)   Narrative:   CLINICAL DATA: Left leg pain and swelling for 3 weeks.  EXAM: LEFT LOWER EXTREMITY VENOUS DOPPLER ULTRASOUND  TECHNIQUE: Gray-scale sonography with graded compression, as well as color Doppler and duplex ultrasound were performed to evaluate the lower extremity deep venous systems from the level of the common femoral vein and including the  common femoral, femoral, profunda femoral, popliteal and calf veins including the posterior tibial, peroneal and gastrocnemius veins when visible. The superficial great saphenous vein was also interrogated. Spectral Doppler was utilized to evaluate flow at rest and with distal augmentation maneuvers in the common femoral, femoral and popliteal veins.  COMPARISON: None.  FINDINGS: Contralateral Common Femoral Vein: Respiratory phasicity is normal and symmetric with the symptomatic side. No evidence of thrombus. Normal compressibility.  Common Femoral Vein: No evidence of thrombus. Normal compressibility, respiratory phasicity and response to augmentation.  Saphenofemoral Junction: No evidence of thrombus. Normal compressibility and flow on color Doppler  imaging.  Profunda Femoral Vein: No evidence of thrombus. Normal compressibility and flow on color Doppler imaging.  Femoral Vein: No evidence of thrombus. Normal compressibility, respiratory phasicity and response to augmentation.  Popliteal Vein: No evidence of thrombus. Normal compressibility, respiratory phasicity and response to augmentation.  Calf Veins: No evidence of thrombus. Normal compressibility and flow on color Doppler imaging.  Superficial Great Saphenous Vein: No evidence of thrombus. Normal compressibility and flow on color Doppler imaging.  Venous Reflux: None.  Other Findings: None.  IMPRESSION: No evidence of left lower extremity deep venous thrombosis.   Electronically Signed By: Jeb Levering M.D. On: 05/12/2016 22:53          CT RENAL STONE STUDY (Final result) Result time: 05/12/16 22:30:21   Final result by Rad Results In Interface (05/12/16 22:30:21)   Narrative:   CLINICAL DATA: Acute onset of left flank pain. Initial encounter.  EXAM: CT ABDOMEN AND PELVIS WITHOUT CONTRAST  TECHNIQUE: Multidetector CT imaging of the abdomen and pelvis was performed following the  standard protocol without IV contrast.  COMPARISON: CT of the abdomen and pelvis from 10/19/2008  FINDINGS: The visualized lung bases are clear.  Scattered calcified granulomata are noted within the liver. The spleen is unremarkable in appearance. The gallbladder is decompressed and not well assessed. The pancreas and adrenal glands are unremarkable.  Nonspecific perinephric stranding is noted bilaterally. A few small bilateral renal cysts are seen. The kidneys are otherwise unremarkable. There is no evidence of hydronephrosis. No renal or ureteral stones are identified.  No free fluid is identified. The small bowel is unremarkable in appearance. The stomach is within normal limits. No acute vascular abnormalities are seen. Scattered calcification is noted along the abdominal aorta and its branches.  The appendix is normal in caliber, without evidence of appendicitis. The colon is grossly unremarkable in appearance.  The bladder is decompressed and not well assessed. The patient is status post hysterectomy. No suspicious adnexal masses are seen. No inguinal lymphadenopathy is seen.  No acute osseous abnormalities are identified. There is grade 1 anterolisthesis of L4 on L5, and grade 1 anterolisthesis of L5 on S1, with underlying facet disease.  IMPRESSION: 1. No acute abnormality seen within the abdomen or pelvis. 2. Scattered calcification along the abdominal aorta and its branches. 3. Few small bilateral renal cysts seen. 4. Minimal degenerative change at the lower lumbar spine.   Electronically Signed By: Garald Balding M.D. On: 05/12/2016 22:30               I personally reviewed the radiologic studies     ED Course:  As far the patient's stay here was uneventful and her doesn't appear to be any life-threatening causes for her left lower quadrant abdominal pain. Patient's waiting laboratory work assessment specifically the lipase. She otherwise  was given low-dose morphine for her discomfort. Urinalysis appears within normal limits. Patient's case will be reviewed with my colleague to follow results of her laboratory work. Doppler study of her left leg shows no deep venous thrombosis.   Assessment: *Acute unspecified left side abdominal pain    Plan: *Likely discharge if laboratory work is within normal limits.          Daymon Larsen, MD 05/12/16 [phone]

## 2016-05-13 DIAGNOSIS — R1032 Left lower quadrant pain: Secondary | ICD-10-CM | POA: Diagnosis not present

## 2016-05-13 MED ORDER — HYDROCODONE-ACETAMINOPHEN 5-325 MG PO TABS
1.0000 | ORAL_TABLET | Freq: Once | ORAL | Status: AC
  Administered 2016-05-13: 1 via ORAL
  Filled 2016-05-13: qty 1

## 2016-05-13 NOTE — ED Provider Notes (Signed)
Patient reports she still having left-sided abdominal and flank pain pain radiates from left upper quadrant down towards the groin. There is a little bit of tenderness on palpation bowel sounds are positive but somewhat decreased pain is similar to what she had earlier CT shows no evidence of any pathology blood count is normal differentials normal lipase urine all look okay I discussed this with the patient and her family. We will give her 1 Vicodin for pain for now so that she can sleep and then they are to return if she is worse at all or if the pain is not better if she has any fever vomiting or any other symptoms develop. She says she did pass some gas and thought she felt a little bit better after that.  Nena Polio, MD 05/13/16 [phone]

## 2016-05-13 NOTE — Discharge Instructions (Signed)
Abdominal Pain, Adult Many things can cause abdominal pain. Usually, abdominal pain is not caused by a disease and will improve without treatment. It can often be observed and treated at home. Your health care provider will do a physical exam and possibly order blood tests and X-rays to help determine the seriousness of your pain. However, in many cases, more time must pass before a clear cause of the pain can be found. Before that point, your health care provider may not know if you need more testing or further treatment. HOME CARE INSTRUCTIONS Monitor your abdominal pain for any changes. The following actions may help to alleviate any discomfort you are experiencing:  Only take over-the-counter or prescription medicines as directed by your health care provider.  Do not take laxatives unless directed to do so by your health care provider.  Try a clear liquid diet (broth, tea, or water) as directed by your health care provider. Slowly move to a bland diet as tolerated. SEEK MEDICAL CARE IF:  You have unexplained abdominal pain.  You have abdominal pain associated with nausea or diarrhea.  You have pain when you urinate or have a bowel movement.  You experience abdominal pain that wakes you in the night.  You have abdominal pain that is worsened or improved by eating food.  You have abdominal pain that is worsened with eating fatty foods.  You have a fever. SEEK IMMEDIATE MEDICAL CARE IF:  Your pain does not go away within 2 hours.  You keep throwing up (vomiting).  Your pain is felt only in portions of the abdomen, such as the right side or the left lower portion of the abdomen.  You pass bloody or black tarry stools. MAKE SURE YOU:  Understand these instructions.  Will watch your condition.  Will get help right away if you are not doing well or get worse.   This information is not intended to replace advice given to you by your health care provider. Make sure you discuss  any questions you have with your health care provider.   Document Released: 08/07/2005 Document Revised: 07/19/2015 Document Reviewed: 07/07/2013 Elsevier Interactive Patient Education [employer].   Please return for worse pain fever vomiting or feeling sicker at all. Please also return or see her doctor if you're not significantly better in the morning.

## 2016-05-15 ENCOUNTER — Other Ambulatory Visit: Payer: Self-pay | Admitting: *Deleted

## 2016-05-15 DIAGNOSIS — D509 Iron deficiency anemia, unspecified: Secondary | ICD-10-CM

## 2016-05-15 MED ORDER — FERROUS SULFATE 325 (65 FE) MG PO TBEC: 325.0000 mg | DELAYED_RELEASE_TABLET | Freq: Two times a day (BID) | ORAL | Status: DC

## 2016-05-22 ENCOUNTER — Other Ambulatory Visit: Payer: Medicare Other

## 2016-05-23 LAB — FECAL OCCULT BLOOD, IMMUNOCHEMICAL
FECAL OCCULT BLOOD: POSITIVE — AB
FECAL OCCULT BLOOD: POSITIVE — AB
FECAL OCCULT BLOOD: POSITIVE — AB

## 2016-05-24 ENCOUNTER — Other Ambulatory Visit: Payer: Self-pay | Admitting: *Deleted

## 2016-05-24 DIAGNOSIS — D6489 Other specified anemias: Secondary | ICD-10-CM

## 2016-05-24 DIAGNOSIS — R195 Other fecal abnormalities: Secondary | ICD-10-CM

## 2016-05-28 ENCOUNTER — Other Ambulatory Visit: Payer: Self-pay | Admitting: Adult Health

## 2016-05-29 ENCOUNTER — Encounter (INDEPENDENT_AMBULATORY_CARE_PROVIDER_SITE_OTHER): Payer: Self-pay | Admitting: Internal Medicine

## 2016-06-05 ENCOUNTER — Telehealth: Payer: Self-pay | Admitting: Family Medicine

## 2016-06-12 ENCOUNTER — Encounter (INDEPENDENT_AMBULATORY_CARE_PROVIDER_SITE_OTHER): Payer: Self-pay | Admitting: Internal Medicine

## 2016-06-12 ENCOUNTER — Other Ambulatory Visit (INDEPENDENT_AMBULATORY_CARE_PROVIDER_SITE_OTHER): Payer: Self-pay | Admitting: Internal Medicine

## 2016-06-12 ENCOUNTER — Ambulatory Visit (INDEPENDENT_AMBULATORY_CARE_PROVIDER_SITE_OTHER): Payer: Medicare Other | Admitting: Internal Medicine

## 2016-06-12 VITALS — BP 112/80 | HR 72 | Temp 98.0°F | Ht 60.0 in | Wt 131.9 lb

## 2016-06-12 DIAGNOSIS — D62 Acute posthemorrhagic anemia: Secondary | ICD-10-CM | POA: Diagnosis not present

## 2016-06-12 DIAGNOSIS — D509 Iron deficiency anemia, unspecified: Secondary | ICD-10-CM

## 2016-06-12 DIAGNOSIS — R195 Other fecal abnormalities: Secondary | ICD-10-CM

## 2016-06-12 LAB — CBC WITH DIFFERENTIAL/PLATELET
Basophils Absolute: 35 cells/uL (ref 0–200)
Basophils Relative: 1 %
EOS PCT: 1 %
Eosinophils Absolute: 35 cells/uL (ref 15–500)
HEMATOCRIT: 25.9 % — AB (ref 35.0–45.0)
HEMOGLOBIN: 7.9 g/dL — AB (ref 11.7–15.5)
LYMPHS ABS: 1295 {cells}/uL (ref 850–3900)
Lymphocytes Relative: 37 %
MCH: 21.2 pg — ABNORMAL LOW (ref 27.0–33.0)
MCHC: 30.5 g/dL — ABNORMAL LOW (ref 32.0–36.0)
MCV: 69.4 fL — ABNORMAL LOW (ref 80.0–100.0)
MONO ABS: 210 {cells}/uL (ref 200–950)
MPV: 10 fL (ref 7.5–12.5)
Monocytes Relative: 6 %
NEUTROS ABS: 1925 {cells}/uL (ref 1500–7800)
Neutrophils Relative %: 55 %
Platelets: 226 10*3/uL (ref 140–400)
RBC: 3.73 MIL/uL — AB (ref 3.80–5.10)
RDW: 18.9 % — ABNORMAL HIGH (ref 11.0–15.0)
WBC: 3.5 10*3/uL — AB (ref 3.8–10.8)

## 2016-06-12 NOTE — Progress Notes (Signed)
   Subjective:    Patient ID: Evelyn Baird, female    DOB: 1930/04/21, 82 y.o.   MRN: [id number]  HPI Referred by Dr. Buelah Manis for guaiac positive stool, anemia.  Her stools are dark brown. She has a BM every day. No change in her bowel habits. She has about a 14 pound weight loss over 2 month period. Her appetite is good. Sometimes she has epigastric pain after she eats.  Daughter states her mother sometimes is dizzy.  Records indicate she has had anemia since 2013. 05/07/2016 Ferritin 12, Iron 16, TIBC 415, % sat 4. Vitamin B12 511 Last colonoscopy was greater than 5 yrs ago ? In [place] Ref Rng & Units 05/12/2016 05/07/2016 12/18/2015  WBC 3.6 - 11.0 K/uL 3.7 3.4(L) 4.5  Hemoglobin 12.0 - 16.0 g/dL 7.9(L) 8.5(LL) 10.1(L)  Hematocrit 35.0 - 47.0 % 24.6(L) 27.8(L) 31.3(L)  Platelets 150 - 440 K/uL 192 277 196      Review of Systems Past Medical History:  Diagnosis Date  . Coronary atherosclerosis of native coronary artery    Nonobstructive 2009 - previously followed by [facility]  . Diabetes mellitus without complication ([place])   . Essential hypertension, benign   . Hyperlipidemia   . Peripheral neuropathy ([place])   . Prediabetes   . PVC's (premature ventricular contractions)     Past Surgical History:  Procedure Laterality Date  . HERNIA REPAIR    . Left abdominal hernia repair      Allergies  Allergen Reactions  . Beef-Derived Products Itching and Nausea And Vomiting  . Pork-Derived Products Itching and Nausea And Vomiting    Current Outpatient Prescriptions on File Prior to Visit  Medication Sig Dispense Refill  . aspirin EC 81 MG tablet Take 1 tablet (81 mg total) by mouth every morning. 90 tablet 3  . atorvastatin (LIPITOR) 10 MG tablet Take 1 tablet (10 mg total) by mouth at bedtime. FOR CHOLESTEROL 90 tablet 3  . ferrous sulfate 325 (65 FE) MG EC tablet Take 1 tablet (325 mg total) by mouth 2 (two) times daily with a meal. 60 tablet 3  . lisinopril  (PRINIVIL,ZESTRIL) 20 MG tablet Take 1 tablet (20 mg total) by mouth daily. 90 tablet 3  . NITROSTAT 0.4 MG SL tablet DISSOLVE (1) TABLET UNDER TONGUE AS NEEDED TO RELIEVE CHEST PAIN. MAYREPEAT EVERY 5 MINUTES. 25 tablet 0  . pantoprazole (PROTONIX) 40 MG tablet TAKE 1 TABLET BY MOUTH ONCE DAILY. 30 tablet 0   No current facility-administered medications on file prior to visit.        Objective:   Physical Exam Blood pressure 112/80, pulse 72, temperature 98 F (36.7 C), height 5' (1.524 m), weight 131 lb 14.4 oz (59.8 kg). Alert and oriented. Skin warm and dry. Oral mucosa is moist.   . Sclera anicteric, conjunctivae is pink. Thyroid not enlarged. No cervical lymphadenopathy. Lungs clear. Heart regular rate and rhythm.  Abdomen is soft. Bowel sounds are positive. No hepatomegaly. No abdominal masses felt. No tenderness.  No edema to lower extremities.  Very little stool, guaiac negative (brown)        Assessment & Plan:  IDA. Guaiac positive stool. Colonic neoplasms needs to be ruled out. If colonoscopy normal will need an EGD to rule out PUD

## 2016-06-12 NOTE — Patient Instructions (Signed)
CBC today.  The risks and benefits such as perforation, bleeding, and infection were reviewed with the patient and is agreeable.

## 2016-06-13 ENCOUNTER — Other Ambulatory Visit (INDEPENDENT_AMBULATORY_CARE_PROVIDER_SITE_OTHER): Payer: Self-pay | Admitting: *Deleted

## 2016-06-17 ENCOUNTER — Telehealth (INDEPENDENT_AMBULATORY_CARE_PROVIDER_SITE_OTHER): Payer: Self-pay | Admitting: Internal Medicine

## 2016-06-17 DIAGNOSIS — D500 Iron deficiency anemia secondary to blood loss (chronic): Secondary | ICD-10-CM

## 2016-06-17 NOTE — Telephone Encounter (Signed)
CBC ordered for tomorrow.

## 2016-06-18 ENCOUNTER — Encounter (INDEPENDENT_AMBULATORY_CARE_PROVIDER_SITE_OTHER): Payer: Self-pay | Admitting: *Deleted

## 2016-06-18 ENCOUNTER — Telehealth (INDEPENDENT_AMBULATORY_CARE_PROVIDER_SITE_OTHER): Payer: Self-pay | Admitting: *Deleted

## 2016-06-18 DIAGNOSIS — R195 Other fecal abnormalities: Secondary | ICD-10-CM | POA: Insufficient documentation

## 2016-06-18 DIAGNOSIS — D509 Iron deficiency anemia, unspecified: Secondary | ICD-10-CM | POA: Insufficient documentation

## 2016-06-18 LAB — CBC
HCT: 24.3 % — ABNORMAL LOW (ref 35.0–45.0)
Hemoglobin: 7.6 g/dL — ABNORMAL LOW (ref 11.7–15.5)
MCH: 21.2 pg — AB (ref 27.0–33.0)
MCHC: 31.3 g/dL — AB (ref 32.0–36.0)
MCV: 67.7 fL — ABNORMAL LOW (ref 80.0–100.0)
MPV: 9.7 fL (ref 7.5–12.5)
Platelets: 255 10*3/uL (ref 140–400)
RBC: 3.59 MIL/uL — ABNORMAL LOW (ref 3.80–5.10)
RDW: 19.2 % — AB (ref 11.0–15.0)
WBC: 3.6 10*3/uL — AB (ref 3.8–10.8)

## 2016-06-18 NOTE — Telephone Encounter (Signed)
Patient needs trilyte 

## 2016-06-19 NOTE — Telephone Encounter (Signed)
Evelyn Baird, Patient needs to be typed at crossed for 2 units.  I have spoken with patient.

## 2016-06-19 NOTE — Telephone Encounter (Signed)
An order has been completed and fax to [facility] to arrange the transfusions.

## 2016-06-20 ENCOUNTER — Encounter (HOSPITAL_COMMUNITY)
Admission: RE | Admit: 2016-06-20 | Discharge: 2016-06-20 | Disposition: A | Discharge status: Home / Self Care | Payer: Medicare Other | Source: Ambulatory Visit | Attending: Internal Medicine | Admitting: Internal Medicine

## 2016-06-20 DIAGNOSIS — D6489 Other specified anemias: Secondary | ICD-10-CM | POA: Insufficient documentation

## 2016-06-20 DIAGNOSIS — R195 Other fecal abnormalities: Secondary | ICD-10-CM | POA: Insufficient documentation

## 2016-06-20 LAB — ABO/RH: ABO/RH(D): O POS

## 2016-06-20 LAB — HEMOGLOBIN AND HEMATOCRIT, BLOOD
HEMATOCRIT: 24.9 % — AB (ref 36.0–46.0)
Hemoglobin: 7.8 g/dL — ABNORMAL LOW (ref 12.0–15.0)

## 2016-06-21 ENCOUNTER — Encounter (HOSPITAL_COMMUNITY)
Admission: RE | Admit: 2016-06-21 | Discharge: 2016-06-21 | Disposition: A | Discharge status: Home / Self Care | Payer: Medicare Other | Source: Ambulatory Visit | Attending: Internal Medicine | Admitting: Internal Medicine

## 2016-06-21 DIAGNOSIS — D6489 Other specified anemias: Secondary | ICD-10-CM | POA: Diagnosis not present

## 2016-06-21 LAB — HEMOGLOBIN AND HEMATOCRIT, BLOOD
HEMATOCRIT: 31.3 % — AB (ref 36.0–46.0)
Hemoglobin: 10.2 g/dL — ABNORMAL LOW (ref 12.0–15.0)

## 2016-06-21 MED ORDER — ACETAMINOPHEN 325 MG PO TABS
650.0000 mg | ORAL_TABLET | Freq: Once | ORAL | Status: AC
  Administered 2016-06-21: 650 mg via ORAL

## 2016-06-21 MED ORDER — ACETAMINOPHEN 325 MG PO TABS
ORAL_TABLET | ORAL | Status: AC
  Filled 2016-06-21: qty 2

## 2016-06-21 MED ORDER — SODIUM CHLORIDE 0.9 % IV SOLN
Freq: Once | INTRAVENOUS | Status: AC
  Administered 2016-06-21: 250 mL via INTRAVENOUS

## 2016-06-21 MED ORDER — DIPHENHYDRAMINE HCL 25 MG PO CAPS
ORAL_CAPSULE | ORAL | Status: AC
  Filled 2016-06-21: qty 1

## 2016-06-21 MED ORDER — DIPHENHYDRAMINE HCL 25 MG PO CAPS
25.0000 mg | ORAL_CAPSULE | Freq: Once | ORAL | Status: AC
  Administered 2016-06-21: 25 mg via ORAL

## 2016-06-21 NOTE — Progress Notes (Signed)
Results for OLEVIA, BUDMAN (MRN [id number]) as of 06/21/2016 14:54  Ref. Range 06/21/2016 12:45  Hemoglobin Latest Ref Range: 12.0 - 15.0 g/dL 10.2 (L)  HCT Latest Ref Range: 36.0 - 46.0 % 31.3 (L)

## 2016-06-22 LAB — TYPE AND SCREEN
ABO/RH(D): O POS
ANTIBODY SCREEN: NEGATIVE
UNIT DIVISION: 0
Unit division: 0

## 2016-06-26 ENCOUNTER — Other Ambulatory Visit (INDEPENDENT_AMBULATORY_CARE_PROVIDER_SITE_OTHER): Payer: Self-pay | Admitting: *Deleted

## 2016-06-26 ENCOUNTER — Encounter (INDEPENDENT_AMBULATORY_CARE_PROVIDER_SITE_OTHER): Payer: Self-pay | Admitting: *Deleted

## 2016-06-26 DIAGNOSIS — D649 Anemia, unspecified: Secondary | ICD-10-CM

## 2016-06-26 DIAGNOSIS — R1310 Dysphagia, unspecified: Secondary | ICD-10-CM

## 2016-06-26 MED ORDER — PEG 3350-KCL-NA BICARB-NACL 420 G PO SOLR: 4000.0000 mL | Freq: Once | ORAL | 0 refills | Status: AC

## 2016-07-19 ENCOUNTER — Encounter (HOSPITAL_COMMUNITY)
Admission: RE | Disposition: A | Discharge status: Home Health | Payer: Self-pay | Source: Ambulatory Visit | Attending: Internal Medicine

## 2016-07-19 ENCOUNTER — Ambulatory Visit (HOSPITAL_COMMUNITY)
Admission: RE | Admit: 2016-07-19 | Discharge: 2016-07-19 | Disposition: A | Discharge status: Home Health | Payer: Medicare Other | Source: Ambulatory Visit | Attending: Internal Medicine | Admitting: Internal Medicine

## 2016-07-19 ENCOUNTER — Encounter (HOSPITAL_COMMUNITY): Payer: Self-pay | Admitting: *Deleted

## 2016-07-19 ENCOUNTER — Ambulatory Visit (HOSPITAL_COMMUNITY)
Admission: RE | Admit: 2016-07-19 | Discharge: 2016-07-19 | Disposition: A | Discharge status: Home / Self Care | Payer: Medicare Other | Source: Ambulatory Visit | Attending: Internal Medicine | Admitting: Internal Medicine

## 2016-07-19 DIAGNOSIS — E785 Hyperlipidemia, unspecified: Secondary | ICD-10-CM | POA: Insufficient documentation

## 2016-07-19 DIAGNOSIS — D125 Benign neoplasm of sigmoid colon: Secondary | ICD-10-CM | POA: Diagnosis not present

## 2016-07-19 DIAGNOSIS — Z7982 Long term (current) use of aspirin: Secondary | ICD-10-CM | POA: Diagnosis not present

## 2016-07-19 DIAGNOSIS — C184 Malignant neoplasm of transverse colon: Secondary | ICD-10-CM | POA: Diagnosis not present

## 2016-07-19 DIAGNOSIS — E1142 Type 2 diabetes mellitus with diabetic polyneuropathy: Secondary | ICD-10-CM | POA: Diagnosis not present

## 2016-07-19 DIAGNOSIS — K219 Gastro-esophageal reflux disease without esophagitis: Secondary | ICD-10-CM | POA: Insufficient documentation

## 2016-07-19 DIAGNOSIS — D509 Iron deficiency anemia, unspecified: Secondary | ICD-10-CM

## 2016-07-19 DIAGNOSIS — D500 Iron deficiency anemia secondary to blood loss (chronic): Secondary | ICD-10-CM | POA: Diagnosis not present

## 2016-07-19 DIAGNOSIS — R195 Other fecal abnormalities: Secondary | ICD-10-CM

## 2016-07-19 DIAGNOSIS — I2510 Atherosclerotic heart disease of native coronary artery without angina pectoris: Secondary | ICD-10-CM | POA: Diagnosis not present

## 2016-07-19 DIAGNOSIS — I10 Essential (primary) hypertension: Secondary | ICD-10-CM | POA: Insufficient documentation

## 2016-07-19 HISTORY — PX: POLYPECTOMY: SHX5525

## 2016-07-19 HISTORY — PX: COLONOSCOPY: SHX5424

## 2016-07-19 HISTORY — PX: BIOPSY: SHX5522

## 2016-07-19 LAB — HEMOGLOBIN AND HEMATOCRIT, BLOOD
HCT: 29.5 % — ABNORMAL LOW (ref 36.0–46.0)
Hemoglobin: 9.4 g/dL — ABNORMAL LOW (ref 12.0–15.0)

## 2016-07-19 LAB — GLUCOSE, CAPILLARY: Glucose-Capillary: 81 mg/dL (ref 65–99)

## 2016-07-19 SURGERY — COLONOSCOPY
Anesthesia: Moderate Sedation

## 2016-07-19 MED ORDER — MIDAZOLAM HCL 5 MG/5ML IJ SOLN
INTRAMUSCULAR | Status: DC | PRN
  Administered 2016-07-19 (×3): 1 mg via INTRAVENOUS

## 2016-07-19 MED ORDER — MIDAZOLAM HCL 5 MG/5ML IJ SOLN
INTRAMUSCULAR | Status: AC
  Filled 2016-07-19: qty 10

## 2016-07-19 MED ORDER — MEPERIDINE HCL 50 MG/ML IJ SOLN
INTRAMUSCULAR | Status: AC
  Filled 2016-07-19: qty 1

## 2016-07-19 MED ORDER — MEPERIDINE HCL 50 MG/ML IJ SOLN
INTRAMUSCULAR | Status: DC | PRN
  Administered 2016-07-19 (×2): 20 mg via INTRAVENOUS

## 2016-07-19 MED ORDER — SODIUM CHLORIDE 0.9 % IV SOLN
INTRAVENOUS | Status: DC
  Administered 2016-07-19: 1000 mL via INTRAVENOUS

## 2016-07-19 MED ORDER — SIMETHICONE 40 MG/0.6ML PO SUSP
ORAL | Status: DC | PRN
  Administered 2016-07-19: 13:00:00

## 2016-07-19 NOTE — Op Note (Addendum)
[facility] Patient Name: Evelyn Baird Procedure Date: 07/19/2016 12:40 PM MRN: [id number] Date of Birth: 11-30-1929 Attending MD: Hildred Laser , MD CSN: [id number] Age: 82 Admit Type: Outpatient Procedure:                Colonoscopy Indications:              Iron deficiency anemia secondary to chronic blood                            loss Providers:                Hildred Laser, MD, Otis Peak B. Sharon Seller, RN, Bonnetta Barry, Technician Referring MD:             Modena Nunnery. Harper, MD Medicines:                Meperidine 40 mg IV, Midazolam 3 mg IV Complications:            No immediate complications. Estimated Blood Loss:     Estimated blood loss was minimal. Procedure:                Pre-Anesthesia Assessment:                           - Prior to the procedure, a History and Physical                            was performed, and patient medications and                            allergies were reviewed. The patient's tolerance of                            previous anesthesia was also reviewed. The risks                            and benefits of the procedure and the sedation                            options and risks were discussed with the patient.                            All questions were answered, and informed consent                            was obtained. Prior Anticoagulants: The patient                            last took aspirin 3 days prior to the procedure.                            ASA Grade Assessment: III - A patient with severe  systemic disease. After reviewing the risks and                            benefits, the patient was deemed in satisfactory                            condition to undergo the procedure.                           After obtaining informed consent, the colonoscope                            was passed under direct vision. Throughout the                            procedure, the  patient's blood pressure, pulse, and                            oxygen saturations were monitored continuously. The                            [id number]) scope was introduced through                            the anus and advanced to the the cecum, identified                            by appendiceal orifice and ileocecal valve. The                            colonoscopy was performed without difficulty. The                            patient tolerated the procedure well. The quality                            of the bowel preparation was adequate. The                            ileocecal valve, appendiceal orifice, and rectum                            were photographed. Scope In: 12:50:25 PM Scope Out: 1:19:32 PM Scope Withdrawal Time: 0 hours 16 minutes 19 seconds  Total Procedure Duration: 0 hours 29 minutes 7 seconds  Findings:      A polypoid and ulcerated non-obstructing large circumferential mass was       found in the proximal transverse colon. The mass was circumferential.       The mass measured two cm in length. No bleeding was present. Biopsies       were taken with a cold forceps for histology.      A small polyp was found in the sigmoid colon. The polyp was sessile. The       polyp was removed with a cold snare. Resection and retrieval were  complete.      The retroflexed view of the distal rectum and anal verge was normal and       showed no anal or rectal abnormalities. 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                            Biopsied.                           - One small polyp in the sigmoid colon, removed                            with a cold snare. Resected and retrieved. Moderate Sedation:      Moderate (conscious) sedation was administered by the endoscopy nurse       and supervised by the endoscopist. The following parameters were       monitored: oxygen saturation, heart rate, blood pressure, CO2        capnography and response to care. Total physician intraservice time was       34 minutes. Recommendation:           - Patient has a contact number available for                            emergencies. The signs and symptoms of potential                            delayed complications were discussed with the                            patient. Return to normal activities tomorrow.                            Written discharge instructions were provided to the                            patient.                           - Resume previous diet today.                           - Continue present medications.                           - Await pathology results.                           - H&H and CEA level.                           - Repeat colonoscopy is recommended for                            surveillance. The colonoscopy date will be  determined after pathology results from today's                            exam become available for review. Procedure Code(s):        --- Professional ---                           [phone], Colonoscopy, flexible; with removal of                            tumor(s), polyp(s), or other lesion(s) by snare                            technique                           45380, 59, Colonoscopy, flexible; with biopsy,                            single or multiple                           99152, Moderate sedation services provided by the                            same physician or other qualified health care                            professional performing the diagnostic or                            therapeutic service that the sedation supports,                            requiring the presence of an independent trained                            observer to assist in the monitoring of the                            patient's level of consciousness and physiological                            status; initial 15 minutes of  intraservice time,                            patient age 52 years or older                           [phone], Moderate sedation services; each additional                            15 minutes intraservice time Diagnosis Code(s):        --- Professional ---  C18.4, Malignant neoplasm of transverse colon                           D12.5, Benign neoplasm of sigmoid colon                           D50.0, Iron deficiency anemia secondary to blood                            loss (chronic) CPT copyright 2016 American Medical Association. All rights reserved. The codes documented in this report are preliminary and upon coder review may  be revised to meet current compliance requirements. Hildred Laser, MD Hildred Laser, MD 07/19/2016 1:32:06 PM This report has been signed electronically. Number of Addenda: 0

## 2016-07-19 NOTE — H&P (Signed)
Evelyn Baird is an 82 y.o. female.   Chief Complaint: Patient is here for colonoscopy and possible EGD. HPI: Patient is 82 year old Caucasian female with multiple medical problems who was found to have iron deficiency anemia last month and has received 2 units of PRBCs. Her stool was reportedly heme-positive there is no history of melena or rectal bleeding. She says she had a 6 valve 2 months ago and lost some weight but her appetite is back to normal. She has never been screened for CRC. She says she is not having heartburn while on PPI. Her hemoglobin on 06/17/2016 was 7.9.  She received 2 units of PRBCs and hemoglobin was 10.21 06/21/2016. Family History is negative for CRC.  Past Medical History:  Diagnosis Date  . Anemia   . Coronary atherosclerosis of native coronary artery    Nonobstructive 2009 - previously followed by [facility]  . Diabetes mellitus without complication ([place])   . Essential hypertension, benign   . GERD (gastroesophageal reflux disease)   . Hyperlipidemia   . Hypertension   . Peripheral neuropathy ([place])   . Prediabetes   . PVC's (premature ventricular contractions)     Past Surgical History:  Procedure Laterality Date  . HERNIA REPAIR    . Left abdominal hernia repair      Family History  Problem Relation Age of Onset  . Hypertension Father   . Kidney disease Father    Social History:  reports that she has never smoked. She has never used smokeless tobacco. She reports that she does not drink alcohol or use drugs.  Allergies:  Allergies  Allergen Reactions  . Beef-Derived Products Itching and Nausea And Vomiting  . Pork-Derived Products Itching and Nausea And Vomiting    Medications Prior to Admission  Medication Sig Dispense Refill  . aspirin EC 81 MG tablet Take 1 tablet (81 mg total) by mouth every morning. 90 tablet 3  . atorvastatin (LIPITOR) 10 MG tablet Take 1 tablet (10 mg total) by mouth at bedtime. FOR CHOLESTEROL 90 tablet 3  . lisinopril  (PRINIVIL,ZESTRIL) 20 MG tablet Take 1 tablet (20 mg total) by mouth daily. 90 tablet 3  . NITROSTAT 0.4 MG SL tablet DISSOLVE (1) TABLET UNDER TONGUE AS NEEDED TO RELIEVE CHEST PAIN. MAYREPEAT EVERY 5 MINUTES. 25 tablet 0  . pantoprazole (PROTONIX) 40 MG tablet TAKE 1 TABLET BY MOUTH ONCE DAILY. 30 tablet 0  . ferrous sulfate 325 (65 FE) MG EC tablet Take 1 tablet (325 mg total) by mouth 2 (two) times daily with a meal. 60 tablet 3    Results for orders placed or performed during the hospital encounter of 07/19/16 (from the past 48 hour(s))  Glucose, capillary     Status: None   Collection Time: 07/19/16 11:50 AM  Result Value Ref Range   Glucose-Capillary 81 65 - 99 mg/dL   No results found.  ROS  Blood pressure (!) 149/77, pulse 63, temperature 98.7 F (37.1 C), temperature source Oral, resp. rate (!) 26, height 5\' 6"  (1.676 m), weight 131 lb (59.4 kg), SpO2 100 %. Physical Exam  Constitutional:  Well-developed thin African-American female in NAD.  HENT:  Mouth/Throat: Oropharynx is clear and moist.  Eyes: Conjunctivae are normal. No scleral icterus.  Neck: No thyromegaly present.  Cardiovascular: Normal rate, regular rhythm and normal heart sounds.   No murmur heard. Respiratory: Effort normal and breath sounds normal.  GI: Soft. She exhibits no distension and no mass. There is no tenderness.  Musculoskeletal:  Increase pigmentation to left leg with 1-2+ pitting edema. Resume outpatient had Doppler study on 05/12/2016 and was negative for DVT.  Lymphadenopathy:    She has no cervical adenopathy.  Neurological: She is alert.  Skin: Skin is warm and dry.     Assessment/Plan Iron deficiency anemia. Heme-positive stool. Diagnostic colonoscopy followed by EGD if colonoscopy is negative.  Hildred Laser, MD 07/19/2016, 12:39 PM

## 2016-07-19 NOTE — Discharge Instructions (Signed)
Colonoscopy, Care After These instructions give you information on caring for yourself after your procedure. Your doctor may also give you more specific instructions. Call your doctor if you have any problems or questions after your procedure. HOME CARE  Do not drive for 24 hours.  Do not sign important papers or use machinery for 24 hours.  You may shower.  You may go back to your usual activities, but go slower for the first 24 hours.  Take rest breaks often during the first 24 hours.  Walk around or use warm packs on your belly (abdomen) if you have belly cramping or gas.  Drink enough fluids to keep your pee (urine) clear or pale yellow.  Resume your normal diet. Avoid heavy or fried foods.  Avoid drinking alcohol for 24 hours or as told by your doctor.  Only take medicines as told by your doctor. If a tissue sample (biopsy) was taken during the procedure:   Do not take aspirin or blood thinners for 7 days, or as told by your doctor.  Do not drink alcohol for 7 days, or as told by your doctor.  Eat soft foods for the first 24 hours. GET HELP IF: You still have a small amount of blood in your poop (stool) 2-3 days after the procedure. GET HELP RIGHT AWAY IF:  You have more than a small amount of blood in your poop.  You see clumps of tissue (blood clots) in your poop.  Your belly is puffy (swollen).  You feel sick to your stomach (nauseous) or throw up (vomit).  You have a fever.  You have belly pain that gets worse and medicine does not help. MAKE SURE YOU:  Understand these instructions.  Will watch your condition.  Will get help right away if you are not doing well or get worse.   This information is not intended to replace advice given to you by your health care provider. Make sure you discuss any questions you have with your health care provider.   Document Released: 11/30/2010 Document Revised: 11/02/2013 Document Reviewed: 07/05/2013 Elsevier  Interactive Patient Education 2016 [place] usual medications including iron and aspirin. Resume usual diet. No driving for 24 hours. Physician will call with biopsy results and further recommendations.

## 2016-07-20 ENCOUNTER — Emergency Department (HOSPITAL_COMMUNITY)
Admission: EM | Admit: 2016-07-20 | Discharge: 2016-07-20 | Disposition: A | Discharge status: Home / Self Care | Payer: Medicare Other | Attending: Emergency Medicine | Admitting: Emergency Medicine

## 2016-07-20 ENCOUNTER — Encounter (HOSPITAL_COMMUNITY): Payer: Self-pay | Admitting: Emergency Medicine

## 2016-07-20 DIAGNOSIS — E119 Type 2 diabetes mellitus without complications: Secondary | ICD-10-CM | POA: Diagnosis not present

## 2016-07-20 DIAGNOSIS — I2510 Atherosclerotic heart disease of native coronary artery without angina pectoris: Secondary | ICD-10-CM | POA: Diagnosis not present

## 2016-07-20 DIAGNOSIS — Z79899 Other long term (current) drug therapy: Secondary | ICD-10-CM | POA: Insufficient documentation

## 2016-07-20 DIAGNOSIS — Z7982 Long term (current) use of aspirin: Secondary | ICD-10-CM | POA: Insufficient documentation

## 2016-07-20 DIAGNOSIS — K625 Hemorrhage of anus and rectum: Secondary | ICD-10-CM | POA: Diagnosis not present

## 2016-07-20 DIAGNOSIS — I10 Essential (primary) hypertension: Secondary | ICD-10-CM | POA: Diagnosis not present

## 2016-07-20 LAB — CBC WITH DIFFERENTIAL/PLATELET
Basophils Absolute: 0 10*3/uL (ref 0.0–0.1)
Basophils Relative: 1 %
EOS PCT: 0 %
Eosinophils Absolute: 0 10*3/uL (ref 0.0–0.7)
HEMATOCRIT: 32 % — AB (ref 36.0–46.0)
HEMOGLOBIN: 10.2 g/dL — AB (ref 12.0–15.0)
LYMPHS ABS: 1.2 10*3/uL (ref 0.7–4.0)
LYMPHS PCT: 27 %
MCH: 23.7 pg — ABNORMAL LOW (ref 26.0–34.0)
MCHC: 31.9 g/dL (ref 30.0–36.0)
MCV: 74.2 fL — AB (ref 78.0–100.0)
MONOS PCT: 4 %
Monocytes Absolute: 0.2 10*3/uL (ref 0.1–1.0)
NEUTROS ABS: 2.9 10*3/uL (ref 1.7–7.7)
Neutrophils Relative %: 68 %
Platelets: 183 10*3/uL (ref 150–400)
RBC: 4.31 MIL/uL (ref 3.87–5.11)
RDW: 23.2 % — AB (ref 11.5–15.5)
WBC: 4.3 10*3/uL (ref 4.0–10.5)

## 2016-07-20 LAB — BASIC METABOLIC PANEL
ANION GAP: 7 (ref 5–15)
BUN: 10 mg/dL (ref 6–20)
CALCIUM: 8.7 mg/dL — AB (ref 8.9–10.3)
CHLORIDE: 110 mmol/L (ref 101–111)
CO2: 24 mmol/L (ref 22–32)
Creatinine, Ser: 0.79 mg/dL (ref 0.44–1.00)
GFR calc non Af Amer: >60 mL/min (ref >60)
GLUCOSE: 93 mg/dL (ref 65–99)
Potassium: 3.7 mmol/L (ref 3.5–5.1)
Sodium: 141 mmol/L (ref 135–145)

## 2016-07-20 LAB — CEA: CEA: 2.6 ng/mL (ref 0.0–4.7)

## 2016-07-20 LAB — POC OCCULT BLOOD, ED: Fecal Occult Bld: POSITIVE — AB

## 2016-07-20 MED ORDER — SODIUM CHLORIDE 0.9 % IV BOLUS (SEPSIS)
500.0000 mL | Freq: Once | INTRAVENOUS | Status: AC
  Administered 2016-07-20: 500 mL via INTRAVENOUS

## 2016-07-20 NOTE — ED Notes (Signed)
EDP at bedside  

## 2016-07-20 NOTE — ED Notes (Signed)
Patient given meal tray per MD approval  °

## 2016-07-20 NOTE — Discharge Instructions (Signed)
Called Dr. Laural Golden on Monday and let him know that you had some bleeding. His phone number is [phone],  stop taking your aspirin. Follow-up with her family doctor this week also

## 2016-07-20 NOTE — ED Triage Notes (Signed)
Pt reports she had a colonoscopy yesterday and had "a few spoonfuls of blood in stool this morning."  Describes as bright red.

## 2016-07-20 NOTE — ED Provider Notes (Signed)
[facility] DEPT Provider Note   CSN: [id number] Arrival date & time: 07/20/16  1016  By signing my name below, I, Estanislado Pandy, attest that this documentation has been prepared under the direction and in the presence of Milton Ferguson, MD . Electronically Signed: Estanislado Pandy, Scribe. 07/20/2016. 10:47 AM.    History   Chief Complaint Chief Complaint  Patient presents with  . Rectal Bleeding     Patient complains of rectal bleeding today. Patient had a colonoscopy done last week with a biopsy   The history is provided by the patient. No language interpreter was used.  Rectal Bleeding  Quality:  Bright red Amount:  Moderate Duration:  1 day Chronicity:  New Context: not anal fissures   Similar prior episodes: no   Relieved by:  Nothing Worsened by:  Nothing Ineffective treatments:  None tried Associated symptoms: no abdominal pain   Risk factors: no anticoagulant use    HPI Comments:  Evelyn Baird is a 82 y.o. female who presents to the Emergency Department complaining of rectal bleeding that began yesterday after a colonoscopy. Pt has a mass in her colon that was biopsied. Pt states that the bleeding has been moderate. Pt denies abdominal pain.   Past Medical History:  Diagnosis Date  . Anemia   . Coronary atherosclerosis of native coronary artery    Nonobstructive 2009 - previously followed by [facility]  . Diabetes mellitus without complication ([place])   . Essential hypertension, benign   . GERD (gastroesophageal reflux disease)   . Hyperlipidemia   . Hypertension   . Peripheral neuropathy ([place])   . Prediabetes   . PVC's (premature ventricular contractions)     Patient Active Problem List   Diagnosis Date Noted  . IDA (iron deficiency anemia) 06/18/2016  . Guaiac + stool 06/18/2016  . Peripheral edema 04/30/2016  . Esophageal dysmotility 06/20/2015  . Dysphagia 05/09/2015  . Anemia 03/23/2014  . Hereditary and idiopathic peripheral neuropathy 12/06/2013    . Aortic valve disorders 04/03/2012  . Hyperlipidemia 10/15/2006  . Essential hypertension, benign 10/15/2006  . Coronary atherosclerosis of native coronary artery 10/15/2006  . GERD 10/15/2006    Past Surgical History:  Procedure Laterality Date  . HERNIA REPAIR    . Left abdominal hernia repair      OB History    Gravida Para Term Preterm AB Living   7 7 7     6    SAB TAB Ectopic Multiple Live Births                   Home Medications    Prior to Admission medications   Medication Sig Start Date End Date Taking? Authorizing Provider  aspirin EC 81 MG tablet Take 1 tablet (81 mg total) by mouth every morning. 08/18/15   Lendon Colonel, NP  atorvastatin (LIPITOR) 10 MG tablet Take 1 tablet (10 mg total) by mouth at bedtime. FOR CHOLESTEROL 04/30/16   Alycia Rossetti, MD  ferrous sulfate 325 (65 FE) MG EC tablet Take 1 tablet (325 mg total) by mouth 2 (two) times daily with a meal. 05/15/16   Alycia Rossetti, MD  lisinopril (PRINIVIL,ZESTRIL) 20 MG tablet Take 1 tablet (20 mg total) by mouth daily. 08/18/15   Lendon Colonel, NP  NITROSTAT 0.4 MG SL tablet DISSOLVE (1) TABLET UNDER TONGUE AS NEEDED TO RELIEVE CHEST PAIN. MAYREPEAT EVERY 5 MINUTES. 05/28/16   Lendon Colonel, NP  pantoprazole (PROTONIX) 40 MG tablet TAKE 1 TABLET  BY MOUTH ONCE DAILY. 05/28/16   Lendon Colonel, NP    Family History Family History  Problem Relation Age of Onset  . Hypertension Father   . Kidney disease Father     Social History Social History  Substance Use Topics  . Smoking status: Never Smoker  . Smokeless tobacco: Never Used     Comment: Never smoked  . Alcohol use No     Allergies   Beef-derived products and Pork-derived products   Review of Systems Review of Systems  Constitutional: Negative for appetite change and fatigue.  HENT: Negative for congestion, ear discharge and sinus pressure.   Eyes: Negative for discharge.  Respiratory: Negative for cough.    Cardiovascular: Negative for chest pain.  Gastrointestinal: Positive for anal bleeding and hematochezia. Negative for abdominal pain and diarrhea.  Genitourinary: Negative for frequency and hematuria.  Musculoskeletal: Negative for back pain.  Skin: Negative for rash.  Neurological: Negative for seizures and headaches.  Psychiatric/Behavioral: Negative for hallucinations.     Physical Exam Updated Vital Signs BP 165/80 (BP Location: Right Arm)   Pulse 70   Temp 98.1 F (36.7 C) (Oral)   Resp 18   Ht 5\' 6"  (1.676 m)   Wt 131 lb (59.4 kg)   SpO2 97%   BMI 21.14 kg/m   Physical Exam  Constitutional: She is oriented to person, place, and time. She appears well-developed.  HENT:  Head: Normocephalic.  Eyes: Conjunctivae and EOM are normal. No scleral icterus.  Neck: Neck supple. No thyromegaly present.  Cardiovascular: Normal rate and regular rhythm.  Exam reveals no gallop and no friction rub.   No murmur heard. Pulmonary/Chest: No stridor. She has no wheezes. She has no rales. She exhibits no tenderness.  Abdominal: She exhibits no distension. There is no tenderness. There is no rebound.  Genitourinary:  Genitourinary Comments: Rectal exam showed small amount of red blood  Musculoskeletal: Normal range of motion. She exhibits no edema.  Lymphadenopathy:    She has no cervical adenopathy.  Neurological: She is oriented to person, place, and time. She exhibits normal muscle tone. Coordination normal.  Skin: No rash noted. No erythema.  Psychiatric: She has a normal mood and affect. Her behavior is normal.     ED Treatments / Results  DIAGNOSTIC STUDIES:  Oxygen Saturation is 97% on RA, normal by my interpretation.    COORDINATION OF CARE:  10:47 AM Discussed treatment plan with pt at bedside and pt agreed to plan.   Labs (all labs ordered are listed, but only abnormal results are displayed) Labs Reviewed - No data to display  EKG  EKG Interpretation None        Radiology Dg Abd 1 View  Result Date: 07/19/2016 CLINICAL DATA:  Transverse colon cancer.  Clip site location. EXAM: ABDOMEN - 1 VIEW COMPARISON:  05/12/2016 CT abdomen/ pelvis. FINDINGS: Surgical clip overlies the right mid abdomen. No disproportionately dilated small bowel loops. Minimal colonic stool. No evidence of pneumatosis or pneumoperitoneum. Moderate lumbar spondylosis. Degenerative changes in both hips, asymmetric to the left. IMPRESSION: Surgical clip overlies the right mid abdomen. Nonobstructive bowel gas pattern. Electronically Signed   By: Ilona Sorrel M.D.   On: 07/19/2016 14:47    Procedures Procedures (including critical care time)  Medications Ordered in ED Medications - No data to display   Initial Impression / Assessment and Plan / ED Course  I have reviewed the triage vital signs and the nursing notes.  Pertinent labs & imaging  results that were available during my care of the patient were reviewed by me and considered in my medical decision making (see chart for details).  Clinical Course    Patient with small amount of rectal bleeding status post biopsy yesterday. Patient had orthostatic labs unremarkable hemoglobin stable. Patient told to stop taking her aspirin and follow-up with her family doctor this week. Also call her GI doctor on Monday  Final Clinical Impressions(s) / ED Diagnoses   Final diagnoses:  None  The chart was scribed for me under my direct supervision.  I personally performed the history, physical, and medical decision making and all procedures in the evaluation of this patient..   New Prescriptions New Prescriptions   No medications on file     Milton Ferguson, MD 07/20/16 1451

## 2016-07-20 NOTE — ED Notes (Signed)
EDP at bedside updating patient and family. 

## 2016-07-24 ENCOUNTER — Telehealth (INDEPENDENT_AMBULATORY_CARE_PROVIDER_SITE_OTHER): Payer: Self-pay | Admitting: Internal Medicine

## 2016-07-24 ENCOUNTER — Other Ambulatory Visit (INDEPENDENT_AMBULATORY_CARE_PROVIDER_SITE_OTHER): Payer: Self-pay | Admitting: Internal Medicine

## 2016-07-24 DIAGNOSIS — C184 Malignant neoplasm of transverse colon: Secondary | ICD-10-CM

## 2016-07-24 NOTE — Telephone Encounter (Signed)
I will give Dr.Rehman this message.

## 2016-07-24 NOTE — Telephone Encounter (Signed)
Patient's daughter, Janeice Robinson called.  Stated that they missed Dr. Olevia Perches call on Friday and she understands that something was found.  She is very anxious and was very apologetic that she missed Dr. Olevia Perches call.  [phone]

## 2016-07-26 ENCOUNTER — Encounter (INDEPENDENT_AMBULATORY_CARE_PROVIDER_SITE_OTHER): Payer: Self-pay | Admitting: *Deleted

## 2016-07-26 ENCOUNTER — Encounter (HOSPITAL_COMMUNITY): Payer: Self-pay | Admitting: Internal Medicine

## 2016-07-27 ENCOUNTER — Emergency Department (HOSPITAL_COMMUNITY)
Admission: EM | Admit: 2016-07-27 | Discharge: 2016-07-27 | Disposition: A | Discharge status: Home / Self Care | Payer: Medicare Other | Attending: Emergency Medicine | Admitting: Emergency Medicine

## 2016-07-27 ENCOUNTER — Encounter (HOSPITAL_COMMUNITY): Payer: Self-pay | Admitting: *Deleted

## 2016-07-27 DIAGNOSIS — Z7982 Long term (current) use of aspirin: Secondary | ICD-10-CM | POA: Diagnosis not present

## 2016-07-27 DIAGNOSIS — E119 Type 2 diabetes mellitus without complications: Secondary | ICD-10-CM | POA: Insufficient documentation

## 2016-07-27 DIAGNOSIS — I2510 Atherosclerotic heart disease of native coronary artery without angina pectoris: Secondary | ICD-10-CM | POA: Diagnosis not present

## 2016-07-27 DIAGNOSIS — R42 Dizziness and giddiness: Secondary | ICD-10-CM | POA: Insufficient documentation

## 2016-07-27 DIAGNOSIS — I10 Essential (primary) hypertension: Secondary | ICD-10-CM | POA: Diagnosis not present

## 2016-07-27 DIAGNOSIS — Z79899 Other long term (current) drug therapy: Secondary | ICD-10-CM | POA: Insufficient documentation

## 2016-07-27 LAB — CBC WITH DIFFERENTIAL/PLATELET
BASOS ABS: 0.1 10*3/uL (ref 0.0–0.1)
Basophils Relative: 1 %
EOS PCT: 1 %
Eosinophils Absolute: 0 10*3/uL (ref 0.0–0.7)
HEMATOCRIT: 31.5 % — AB (ref 36.0–46.0)
HEMOGLOBIN: 10.2 g/dL — AB (ref 12.0–15.0)
LYMPHS PCT: 26 %
Lymphs Abs: 1.4 10*3/uL (ref 0.7–4.0)
MCH: 24.1 pg — ABNORMAL LOW (ref 26.0–34.0)
MCHC: 32.4 g/dL (ref 30.0–36.0)
MCV: 74.3 fL — AB (ref 78.0–100.0)
Monocytes Absolute: 0.3 10*3/uL (ref 0.1–1.0)
Monocytes Relative: 5 %
NEUTROS ABS: 3.5 10*3/uL (ref 1.7–7.7)
NEUTROS PCT: 67 %
PLATELETS: 176 10*3/uL (ref 150–400)
RBC: 4.24 MIL/uL (ref 3.87–5.11)
RDW: 23.6 % — ABNORMAL HIGH (ref 11.5–15.5)
WBC: 5.3 10*3/uL (ref 4.0–10.5)

## 2016-07-27 LAB — COMPREHENSIVE METABOLIC PANEL
ALK PHOS: 80 U/L (ref 38–126)
ALT: 22 U/L (ref 14–54)
AST: 36 U/L (ref 15–41)
Albumin: 3.2 g/dL — ABNORMAL LOW (ref 3.5–5.0)
Anion gap: 11 (ref 5–15)
BUN: 5 mg/dL — AB (ref 6–20)
CO2: 24 mmol/L (ref 22–32)
CREATININE: 0.77 mg/dL (ref 0.44–1.00)
Calcium: 8.4 mg/dL — ABNORMAL LOW (ref 8.9–10.3)
Chloride: 105 mmol/L (ref 101–111)
GFR calc Af Amer: >60 mL/min (ref >60)
Glucose, Bld: 127 mg/dL — ABNORMAL HIGH (ref 65–99)
Potassium: 3.2 mmol/L — ABNORMAL LOW (ref 3.5–5.1)
Sodium: 140 mmol/L (ref 135–145)
Total Bilirubin: 0.2 mg/dL — ABNORMAL LOW (ref 0.3–1.2)
Total Protein: 6.7 g/dL (ref 6.5–8.1)

## 2016-07-27 LAB — TROPONIN I

## 2016-07-27 MED ORDER — MECLIZINE HCL 12.5 MG PO TABS
25.0000 mg | ORAL_TABLET | Freq: Once | ORAL | Status: AC
  Administered 2016-07-27: 25 mg via ORAL
  Filled 2016-07-27: qty 2

## 2016-07-27 MED ORDER — ONDANSETRON HCL 4 MG PO TABS: 4.0000 mg | ORAL_TABLET | Freq: Three times a day (TID) | ORAL | 0 refills | Status: DC | PRN

## 2016-07-27 MED ORDER — ONDANSETRON 4 MG PO TBDP
4.0000 mg | ORAL_TABLET | Freq: Once | ORAL | Status: AC
  Administered 2016-07-27: 4 mg via ORAL
  Filled 2016-07-27: qty 1

## 2016-07-27 MED ORDER — MECLIZINE HCL 25 MG PO TABS: 25.0000 mg | ORAL_TABLET | Freq: Four times a day (QID) | ORAL | 0 refills | Status: AC | PRN

## 2016-07-27 NOTE — ED Notes (Signed)
Pt states she is feeling better .

## 2016-07-27 NOTE — ED Triage Notes (Signed)
Pt says she woke up this morning to go the restroom & was dizzy. Had a episode 2 days ago of the same

## 2016-07-27 NOTE — ED Provider Notes (Signed)
[facility] DEPT Provider Note   CSN: [id number] Arrival date & time: 07/27/16  0307  Time seen 03:42 AM   History   Chief Complaint Chief Complaint  Patient presents with  . Dizziness    HPI Evelyn Baird is a 82 y.o. female.  HPI patient reports yesterday she had a dizzy spell lasting about 10 minutes. She states when it happened the room was spinning and she had trouble walking. She states prior to arrival tonight she got up to the bathroom and went back to bed and then she had an episode lasting about 15-20 minutes. She states the room was spinning. She had nausea without vomiting. She denies headache or chest pain but states she does feel short of breath when she gets dizzy. She denies any palpitations. She states she has chronic swelling of her legs which are not worse. She states she also had an episode about a week ago and thinks she may have had it before in the past.  PCP Dr Buelah Manis  Past Medical History:  Diagnosis Date  . Anemia   . Coronary atherosclerosis of native coronary artery    Nonobstructive 2009 - previously followed by [facility]  . Diabetes mellitus without complication ([place])   . Essential hypertension, benign   . GERD (gastroesophageal reflux disease)   . Hyperlipidemia   . Hypertension   . Peripheral neuropathy ([place])   . Prediabetes   . PVC's (premature ventricular contractions)     Patient Active Problem List   Diagnosis Date Noted  . IDA (iron deficiency anemia) 06/18/2016  . Guaiac + stool 06/18/2016  . Peripheral edema 04/30/2016  . Esophageal dysmotility 06/20/2015  . Dysphagia 05/09/2015  . Anemia 03/23/2014  . Hereditary and idiopathic peripheral neuropathy 12/06/2013  . Aortic valve disorders 04/03/2012  . Hyperlipidemia 10/15/2006  . Essential hypertension, benign 10/15/2006  . Coronary atherosclerosis of native coronary artery 10/15/2006  . GERD 10/15/2006    Past Surgical History:  Procedure Laterality Date  . BIOPSY  07/19/2016   Procedure: BIOPSY;  Surgeon: Rogene Houston, MD;  Location: [facility] ENDO SUITE;  Service: Endoscopy;;  colon  . COLONOSCOPY N/A 07/19/2016   Procedure: COLONOSCOPY;  Surgeon: Rogene Houston, MD;  Location: [facility] ENDO SUITE;  Service: Endoscopy;  Laterality: N/A;  12:25  . HERNIA REPAIR    . Left abdominal hernia repair    . POLYPECTOMY  07/19/2016   Procedure: POLYPECTOMY;  Surgeon: Rogene Houston, MD;  Location: [facility] ENDO SUITE;  Service: Endoscopy;;  colon    OB History    Gravida Para Term Preterm AB Living   7 7 7     6    SAB TAB Ectopic Multiple Live Births                   Home Medications    Prior to Admission medications   Medication Sig Start Date End Date Taking? Authorizing Provider  aspirin EC 81 MG tablet Take 1 tablet (81 mg total) by mouth every morning. 08/18/15  Yes Lendon Colonel, NP  atorvastatin (LIPITOR) 10 MG tablet Take 1 tablet (10 mg total) by mouth at bedtime. FOR CHOLESTEROL 04/30/16  Yes Alycia Rossetti, MD  lisinopril (PRINIVIL,ZESTRIL) 20 MG tablet Take 1 tablet (20 mg total) by mouth daily. 08/18/15  Yes Lendon Colonel, NP  NITROSTAT 0.4 MG SL tablet DISSOLVE (1) TABLET UNDER TONGUE AS NEEDED TO RELIEVE CHEST PAIN. MAYREPEAT EVERY 5 MINUTES. 05/28/16  Yes Lendon Colonel, NP  pantoprazole (PROTONIX) 40 MG tablet TAKE 1 TABLET BY MOUTH ONCE DAILY. 05/28/16  Yes Lendon Colonel, NP  ferrous sulfate 325 (65 FE) MG EC tablet Take 1 tablet (325 mg total) by mouth 2 (two) times daily with a meal. Patient not taking: Reported on 07/20/2016 05/15/16   Alycia Rossetti, MD  meclizine (ANTIVERT) 25 MG tablet Take 1 tablet (25 mg total) by mouth 4 (four) times daily as needed for dizziness. 07/27/16   Rolland Porter, MD  ondansetron (ZOFRAN) 4 MG tablet Take 1 tablet (4 mg total) by mouth every 8 (eight) hours as needed for nausea or vomiting. 07/27/16   Rolland Porter, MD    Family History Family History  Problem Relation Age of Onset  . Hypertension Father   . Kidney  disease Father     Social History Social History  Substance Use Topics  . Smoking status: Never Smoker  . Smokeless tobacco: Never Used     Comment: Never smoked  . Alcohol use No  Lives alone Lives in an apartment   Allergies   Beef-derived products and Pork-derived products   Review of Systems Review of Systems  All other systems reviewed and are negative.    Physical Exam Updated Vital Signs BP 161/89 (BP Location: Left Arm)   Pulse 73   Temp 98.4 F (36.9 C) (Oral)   Resp 18   Ht 5\' 6"  (1.676 m)   Wt 134 lb (60.8 kg)   SpO2 97%   BMI 21.63 kg/m   Vital signs normal except hypertension   Physical Exam  Constitutional: She is oriented to person, place, and time. She appears well-developed and well-nourished.  Non-toxic appearance. She does not appear ill. No distress.  HENT:  Head: Normocephalic and atraumatic.  Right Ear: External ear normal.  Left Ear: External ear normal.  Nose: Nose normal. No mucosal edema or rhinorrhea.  Mouth/Throat: Oropharynx is clear and moist and mucous membranes are normal. No dental abscesses or uvula swelling.  Eyes: Conjunctivae and EOM are normal. Pupils are equal, round, and reactive to light.  Neck: Normal range of motion and full passive range of motion without pain. Neck supple.  Cardiovascular: Normal rate, regular rhythm and normal heart sounds.  Exam reveals no gallop and no friction rub.   No murmur heard. Pulmonary/Chest: Effort normal and breath sounds normal. No respiratory distress. She has no wheezes. She has no rhonchi. She has no rales. She exhibits no tenderness and no crepitus.  Abdominal: Soft. Normal appearance and bowel sounds are normal. She exhibits no distension. There is no tenderness. There is no rebound and no guarding.  Musculoskeletal: Normal range of motion. She exhibits no edema or tenderness.  Moves all extremities well.   Neurological: She is alert and oriented to person, place, and time. She  has normal strength. No cranial nerve deficit.  Skin: Skin is warm, dry and intact. No rash noted. No erythema. No pallor.  Psychiatric: She has a normal mood and affect. Her speech is normal and behavior is normal. Her mood appears not anxious.  Nursing note and vitals reviewed.    ED Treatments / Results  Labs (all labs ordered are listed, but only abnormal results are displayed) Results for orders placed or performed during the hospital encounter of 07/27/16  CBC with Differential  Result Value Ref Range   WBC 5.3 4.0 - 10.5 K/uL   RBC 4.24 3.87 - 5.11 MIL/uL   Hemoglobin 10.2 (L) 12.0 - 15.0 g/dL  HCT 31.5 (L) 36.0 - 46.0 %   MCV 74.3 (L) 78.0 - 100.0 fL   MCH 24.1 (L) 26.0 - 34.0 pg   MCHC 32.4 30.0 - 36.0 g/dL   RDW 23.6 (H) 11.5 - 15.5 %   Platelets 176 150 - 400 K/uL   Neutrophils Relative % 67 %   Neutro Abs 3.5 1.7 - 7.7 K/uL   Lymphocytes Relative 26 %   Lymphs Abs 1.4 0.7 - 4.0 K/uL   Monocytes Relative 5 %   Monocytes Absolute 0.3 0.1 - 1.0 K/uL   Eosinophils Relative 1 %   Eosinophils Absolute 0.0 0.0 - 0.7 K/uL   Basophils Relative 1 %   Basophils Absolute 0.1 0.0 - 0.1 K/uL  Comprehensive metabolic panel  Result Value Ref Range   Sodium 140 135 - 145 mmol/L   Potassium 3.2 (L) 3.5 - 5.1 mmol/L   Chloride 105 101 - 111 mmol/L   CO2 24 22 - 32 mmol/L   Glucose, Bld 127 (H) 65 - 99 mg/dL   BUN 5 (L) 6 - 20 mg/dL   Creatinine, Ser 0.77 0.44 - 1.00 mg/dL   Calcium 8.4 (L) 8.9 - 10.3 mg/dL   Total Protein 6.7 6.5 - 8.1 g/dL   Albumin 3.2 (L) 3.5 - 5.0 g/dL   AST 36 15 - 41 U/L   ALT 22 14 - 54 U/L   Alkaline Phosphatase 80 38 - 126 U/L   Total Bilirubin 0.2 (L) 0.3 - 1.2 mg/dL   GFR calc non Af Amer >60 >60 mL/min   GFR calc Af Amer >60 >60 mL/min   Anion gap 11 5 - 15  Troponin I  Result Value Ref Range   Troponin I <0.03 <0.03 ng/mL   Laboratory interpretation all normal except  Hypokalemia, stable anemia   EKG  EKG  Interpretation  Date/Time:  Saturday July 27 2016 03:26:37 EDT Ventricular Rate:  74 PR Interval:    QRS Duration: 100 QT Interval:  456 QTC Calculation: 506 R Axis:   -36 Text Interpretation:  Sinus rhythm Left ventricular hypertrophy Prolonged QT interval No significant change since last tracing 18 Dec 2015 Confirmed by Mixtli Reno  MD-I, Camara Renstrom ([id number]) on 07/27/2016 4:45:27 AM       Radiology No results found.  Procedures Procedures (including critical care time)  Medications Ordered in ED Medications  ondansetron (ZOFRAN-ODT) disintegrating tablet 4 mg (4 mg Oral Given 07/27/16 0358)  meclizine (ANTIVERT) tablet 25 mg (25 mg Oral Given 07/27/16 0358)     Initial Impression / Assessment and Plan / ED Course  I have reviewed the triage vital signs and the nursing notes.  Pertinent labs & imaging results that were available during my care of the patient were reviewed by me and considered in my medical decision making (see chart for details).  Clinical Course   Patient was given meclizine and Zofran for her vertigo symptoms. She has no worries some central nervous system symptoms such as headache or numbness or tingling in her extremities.  Pt has ambulated to the bathroom without dizziness. Discussed she is having vertigo and discharged home on zofran and meclizine. Discussed reasons to return to the ED.      Final Clinical Impressions(s) / ED Diagnoses   Final diagnoses:  Vertigo    New Prescriptions New Prescriptions   MECLIZINE (ANTIVERT) 25 MG TABLET    Take 1 tablet (25 mg total) by mouth 4 (four) times daily as needed for dizziness.   ONDANSETRON (  ZOFRAN) 4 MG TABLET    Take 1 tablet (4 mg total) by mouth every 8 (eight) hours as needed for nausea or vomiting.    Plan discharge  Rolland Porter, MD, Barbette Or, MD 07/27/16 [phone]

## 2016-07-27 NOTE — ED Notes (Signed)
Pt alert & oriented x4, stable gait. Patient given discharge instructions, paperwork & prescription(s). Patient instructed to stop at the registration desk to finish any additional paperwork. Patient verbalized understanding. Pt left department in wheelchair escorted by staff. Pt left w/ no further questions. 

## 2016-07-27 NOTE — Discharge Instructions (Signed)
Use the zofran for nausea or vomiting and the meclizine for dizziness. Be very careful going up or down stairs and getting up or into chairs so you you don't fall if you have another dizzy episode again. Recheck if you get a headache, numbness or tingling in your extremities or you seem worse.

## 2016-07-27 NOTE — ED Notes (Signed)
Pt ambulated to restroom & returned to room w/ no complications. 

## 2016-08-01 ENCOUNTER — Emergency Department (HOSPITAL_COMMUNITY): Payer: Medicare Other

## 2016-08-01 ENCOUNTER — Encounter (HOSPITAL_COMMUNITY): Payer: Self-pay | Admitting: Emergency Medicine

## 2016-08-01 ENCOUNTER — Inpatient Hospital Stay (HOSPITAL_COMMUNITY)
Admission: EM | Admit: 2016-08-01 | Discharge: 2016-08-02 | DRG: 066 | Disposition: A | Discharge status: Home / Self Care | Payer: Medicare Other | Attending: Internal Medicine | Admitting: Internal Medicine

## 2016-08-01 ENCOUNTER — Inpatient Hospital Stay (HOSPITAL_COMMUNITY): Payer: Medicare Other

## 2016-08-01 DIAGNOSIS — I639 Cerebral infarction, unspecified: Secondary | ICD-10-CM | POA: Diagnosis present

## 2016-08-01 DIAGNOSIS — R42 Dizziness and giddiness: Secondary | ICD-10-CM

## 2016-08-01 DIAGNOSIS — Z8673 Personal history of transient ischemic attack (TIA), and cerebral infarction without residual deficits: Secondary | ICD-10-CM | POA: Diagnosis present

## 2016-08-01 DIAGNOSIS — E785 Hyperlipidemia, unspecified: Secondary | ICD-10-CM | POA: Diagnosis present

## 2016-08-01 DIAGNOSIS — K219 Gastro-esophageal reflux disease without esophagitis: Secondary | ICD-10-CM | POA: Diagnosis present

## 2016-08-01 DIAGNOSIS — I2510 Atherosclerotic heart disease of native coronary artery without angina pectoris: Secondary | ICD-10-CM | POA: Diagnosis present

## 2016-08-01 DIAGNOSIS — Z841 Family history of disorders of kidney and ureter: Secondary | ICD-10-CM

## 2016-08-01 DIAGNOSIS — Z8249 Family history of ischemic heart disease and other diseases of the circulatory system: Secondary | ICD-10-CM

## 2016-08-01 DIAGNOSIS — I10 Essential (primary) hypertension: Secondary | ICD-10-CM | POA: Diagnosis present

## 2016-08-01 DIAGNOSIS — E119 Type 2 diabetes mellitus without complications: Secondary | ICD-10-CM | POA: Diagnosis present

## 2016-08-01 DIAGNOSIS — I6789 Other cerebrovascular disease: Secondary | ICD-10-CM | POA: Diagnosis not present

## 2016-08-01 DIAGNOSIS — M6281 Muscle weakness (generalized): Secondary | ICD-10-CM

## 2016-08-01 LAB — CBC WITH DIFFERENTIAL/PLATELET
BASOS ABS: 0.1 10*3/uL (ref 0.0–0.1)
Basophils Relative: 2 %
EOS ABS: 0 10*3/uL (ref 0.0–0.7)
Eosinophils Relative: 0 %
HCT: 32.2 % — ABNORMAL LOW (ref 36.0–46.0)
Hemoglobin: 10.6 g/dL — ABNORMAL LOW (ref 12.0–15.0)
LYMPHS PCT: 17 %
Lymphs Abs: 1.3 10*3/uL (ref 0.7–4.0)
MCH: 24.2 pg — AB (ref 26.0–34.0)
MCHC: 32.9 g/dL (ref 30.0–36.0)
MCV: 73.5 fL — ABNORMAL LOW (ref 78.0–100.0)
Monocytes Absolute: 0.4 10*3/uL (ref 0.1–1.0)
Monocytes Relative: 6 %
NEUTROS ABS: 5.6 10*3/uL (ref 1.7–7.7)
NEUTROS PCT: 75 %
PLATELETS: 206 10*3/uL (ref 150–400)
RBC: 4.38 MIL/uL (ref 3.87–5.11)
RDW: 23.6 % — ABNORMAL HIGH (ref 11.5–15.5)
WBC: 7.4 10*3/uL (ref 4.0–10.5)

## 2016-08-01 LAB — CBG MONITORING, ED: GLUCOSE-CAPILLARY: 94 mg/dL (ref 65–99)

## 2016-08-01 LAB — URINALYSIS, ROUTINE W REFLEX MICROSCOPIC
BILIRUBIN URINE: NEGATIVE
Glucose, UA: NEGATIVE mg/dL
KETONES UR: NEGATIVE mg/dL
Nitrite: NEGATIVE
PROTEIN: 30 mg/dL — AB
Specific Gravity, Urine: 1.025 (ref 1.005–1.030)
pH: 6 (ref 5.0–8.0)

## 2016-08-01 LAB — BASIC METABOLIC PANEL
ANION GAP: 4 — AB (ref 5–15)
BUN: 13 mg/dL (ref 6–20)
CALCIUM: 8.1 mg/dL — AB (ref 8.9–10.3)
CO2: 24 mmol/L (ref 22–32)
Chloride: 107 mmol/L (ref 101–111)
Creatinine, Ser: 0.72 mg/dL (ref 0.44–1.00)
Glucose, Bld: 99 mg/dL (ref 65–99)
Potassium: 3.5 mmol/L (ref 3.5–5.1)
SODIUM: 135 mmol/L (ref 135–145)

## 2016-08-01 LAB — I-STAT TROPONIN, ED: TROPONIN I, POC: 0 ng/mL (ref 0.00–0.08)

## 2016-08-01 LAB — URINE MICROSCOPIC-ADD ON

## 2016-08-01 MED ORDER — STROKE: EARLY STAGES OF RECOVERY BOOK
Freq: Once | Status: AC
  Administered 2016-08-02: 12:00:00
  Filled 2016-08-01: qty 1

## 2016-08-01 MED ORDER — SODIUM CHLORIDE 0.9 % IV SOLN: INTRAVENOUS | Status: DC

## 2016-08-01 MED ORDER — ONDANSETRON 4 MG PO TBDP
4.0000 mg | ORAL_TABLET | Freq: Once | ORAL | Status: AC
  Administered 2016-08-01: 4 mg via ORAL
  Filled 2016-08-01: qty 1

## 2016-08-01 MED ORDER — PANTOPRAZOLE SODIUM 40 MG PO TBEC
40.0000 mg | DELAYED_RELEASE_TABLET | Freq: Every day | ORAL | Status: DC
Start: 2016-08-02 — End: 2016-08-02
  Administered 2016-08-02: 40 mg via ORAL
  Filled 2016-08-01: qty 1

## 2016-08-01 MED ORDER — ASPIRIN 81 MG PO CHEW
81.0000 mg | CHEWABLE_TABLET | Freq: Every day | ORAL | Status: DC
  Administered 2016-08-01 – 2016-08-02 (×2): 81 mg via ORAL
  Filled 2016-08-01 (×2): qty 1

## 2016-08-01 MED ORDER — SENNOSIDES-DOCUSATE SODIUM 8.6-50 MG PO TABS: 1.0000 | ORAL_TABLET | Freq: Every evening | ORAL | Status: DC | PRN

## 2016-08-01 MED ORDER — ATORVASTATIN CALCIUM 10 MG PO TABS
10.0000 mg | ORAL_TABLET | Freq: Every day | ORAL | Status: DC
  Administered 2016-08-01: 10 mg via ORAL
  Filled 2016-08-01: qty 1

## 2016-08-01 MED ORDER — FONDAPARINUX SODIUM 2.5 MG/0.5ML ~~LOC~~ SOLN
2.5000 mg | Freq: Every day | SUBCUTANEOUS | Status: DC
  Administered 2016-08-02: 2.5 mg via SUBCUTANEOUS
  Filled 2016-08-01 (×4): qty 0.5

## 2016-08-01 NOTE — ED Notes (Signed)
Pt taken to MRI  

## 2016-08-01 NOTE — ED Notes (Signed)
Pt transferred to wheelchair and to toilet without assistance, urine collected, pt back in bed and placed back onto cardiac monitor.

## 2016-08-01 NOTE — Progress Notes (Signed)
Called patient's daughter on phone, to conform  CODE STATUS. She wants her mother to be full code for now, and will discuss with her other siblings before  change in the [place].

## 2016-08-01 NOTE — ED Notes (Signed)
Dr. Myrene Buddy given EKG.

## 2016-08-01 NOTE — ED Provider Notes (Signed)
Emergency Department Provider Note   I have reviewed the triage vital signs and the nursing notes.   HISTORY  Chief Complaint Dizziness   HPI Evelyn Baird is a 82 y.o. female with PMH of CAD, DM, HTN, GERD, HLD presents to the emergency department for evaluation of continued vertigo. The patient was seen in the emergency department previously with positional vertigo and nausea. She was discharged home with meclizine and Zofran and states these medications seem to help. Today she had 2 episodes of sudden onset vertigo with some associated nausea and gait instability. She denies symptoms at rest. She took the meclizine and Zofran with little relief. She denies any numbness or weakness in her arms or legs. She does continue to have some nausea. No chest pain or dyspnea.  Past Medical History:  Diagnosis Date  . Anemia   . Coronary atherosclerosis of native coronary artery    Nonobstructive 2009 - previously followed by [facility]  . Diabetes mellitus without complication ([place])   . Essential hypertension, benign   . GERD (gastroesophageal reflux disease)   . Hyperlipidemia   . Hypertension   . Peripheral neuropathy ([place])   . Prediabetes   . PVC's (premature ventricular contractions)     Patient Active Problem List   Diagnosis Date Noted  . CVA (cerebral infarction) 08/01/2016  . Cerebellar stroke ([place]) 08/01/2016  . IDA (iron deficiency anemia) 06/18/2016  . Guaiac + stool 06/18/2016  . Peripheral edema 04/30/2016  . Esophageal dysmotility 06/20/2015  . Dysphagia 05/09/2015  . Anemia 03/23/2014  . Hereditary and idiopathic peripheral neuropathy 12/06/2013  . Aortic valve disorders 04/03/2012  . Hyperlipidemia 10/15/2006  . Essential hypertension, benign 10/15/2006  . Coronary atherosclerosis of native coronary artery 10/15/2006  . GERD 10/15/2006    Past Surgical History:  Procedure Laterality Date  . BIOPSY  07/19/2016   Procedure: BIOPSY;  Surgeon: Rogene Houston, MD;   Location: [facility];  Service: Endoscopy;;  colon  . COLONOSCOPY N/A 07/19/2016   Procedure: COLONOSCOPY;  Surgeon: Rogene Houston, MD;  Location: [facility];  Service: Endoscopy;  Laterality: N/A;  12:25  . HERNIA REPAIR    . Left abdominal hernia repair    . POLYPECTOMY  07/19/2016   Procedure: POLYPECTOMY;  Surgeon: Rogene Houston, MD;  Location: [facility];  Service: Endoscopy;;  colon      Allergies Beef-derived products and Pork-derived products  Family History  Problem Relation Age of Onset  . Hypertension Father   . Kidney disease Father     Social History Social History  Substance Use Topics  . Smoking status: Never Smoker  . Smokeless tobacco: Never Used     Comment: Never smoked  . Alcohol use No    Review of Systems  Constitutional: No fever/chills Eyes: No visual changes. ENT: No sore throat. Positive intermittent vertigo.  Cardiovascular: Denies chest pain. Respiratory: Denies shortness of breath. Gastrointestinal: No abdominal pain. Positive nausea. no vomiting.  No diarrhea.  No constipation. Genitourinary: Negative for dysuria. Musculoskeletal: Negative for back pain. Skin: Negative for rash. Neurological: Negative for headaches, focal weakness or numbness. Positive gait instability.   10-point ROS otherwise negative.  ____________________________________________   PHYSICAL EXAM:  VITAL SIGNS: ED Triage Vitals  Enc Vitals Group     BP 08/01/16 1637 134/70     Pulse Rate 08/01/16 1637 74     Resp 08/01/16 1637 20     Temp 08/01/16 1637 98.8 F (37.1 C)  Temp Source 08/01/16 1637 Oral     SpO2 08/01/16 1637 96 %     Pain Score 08/01/16 1653 0    Constitutional: Alert and oriented. Well appearing and in no acute distress. Eyes: Conjunctivae are normal. PERRL. EOMI. Head: Atraumatic. Nose: No congestion/rhinnorhea. Mouth/Throat: Mucous membranes are moist.  Oropharynx non-erythematous. Neck: No stridor.  Cardiovascular:  Normal rate, regular rhythm. Good peripheral circulation. Grossly normal heart sounds.   Respiratory: Normal respiratory effort.  No retractions. Lungs CTAB. Gastrointestinal: Soft and nontender. No distention.  Musculoskeletal: No lower extremity tenderness nor edema. No gross deformities of extremities. Neurologic:  Normal speech and language. Slight RUE limb ataxia with finger-to-nose testing. Normal strength and sensation.  Skin:  Skin is warm, dry and intact. No rash noted. Psychiatric: Mood and affect are normal. Speech and behavior are normal.  ____________________________________________   LABS (all labs ordered are listed, but only abnormal results are displayed)  Labs Reviewed  BASIC METABOLIC PANEL - Abnormal; Notable for the following:       Result Value   Calcium 8.1 (*)    Anion gap 4 (*)    All other components within normal limits  CBC WITH DIFFERENTIAL/PLATELET - Abnormal; Notable for the following:    Hemoglobin 10.6 (*)    HCT 32.2 (*)    MCV 73.5 (*)    MCH 24.2 (*)    RDW 23.6 (*)    All other components within normal limits  URINALYSIS, ROUTINE W REFLEX MICROSCOPIC (NOT AT [facility]) - Abnormal; Notable for the following:    Hgb urine dipstick TRACE (*)    Protein, ur 30 (*)    Leukocytes, UA MODERATE (*)    All other components within normal limits  URINE MICROSCOPIC-ADD ON - Abnormal; Notable for the following:    Squamous Epithelial / LPF 0-5 (*)    Bacteria, UA FEW (*)    All other components within normal limits  LIPID PANEL - Abnormal; Notable for the following:    Cholesterol 274 (*)    HDL 36 (*)    LDL Cholesterol 213 (*)    All other components within normal limits  HEMOGLOBIN A1C  CBG MONITORING, ED  I-STAT TROPOININ, ED   ____________________________________________  EKG   EKG Interpretation  Date/Time:  Thursday August 01 2016 16:35:41 EDT Ventricular Rate:  74 PR Interval:    QRS Duration: 98 QT Interval:  431 QTC  Calculation: 479 R Axis:   -40 Text Interpretation:  Sinus rhythm Ventricular premature complex Left ventricular hypertrophy Anterior Q waves, possibly due to LVH No STEMI.  Similar to prior EKG.  Confirmed by LONG MD, JOSHUA [phone]) on 08/01/2016 5:07:08 PM       ____________________________________________  RADIOLOGY  Dg Chest 2 View  Result Date: 08/01/2016 CLINICAL DATA:  Shortness of breath, weakness and dizziness. Possible stroke. EXAM: CHEST  2 VIEW COMPARISON:  12/18/2015 FINDINGS: The heart is enlarged but stable. Moderate tortuosity, ectasia and calcification of the thoracic aorta. Chronic lung changes but no acute pulmonary findings. Calcified granulomas are noted. No worrisome pulmonary lesions. Suspect small bilateral pleural effusions. IMPRESSION: Cardiac enlargement and tortuous, ectatic calcified thoracic aorta. Chronic lung changes but no acute pulmonary findings. Suspect small bilateral pleural effusions. Electronically Signed   By: Marijo Sanes M.D.   On: 08/01/2016 23:16   Mr Brain Wo Contrast  Result Date: 08/01/2016 CLINICAL DATA:  Dizziness, nausea and generalized weakness. EXAM: MRI HEAD WITHOUT CONTRAST TECHNIQUE: Multiplanar, multiecho pulse sequences of the brain and  surrounding structures were obtained without intravenous contrast. COMPARISON:  Head CT 02/27/2015 FINDINGS: Brain: There is focal diffusion restriction within the left cerebellar hemisphere, measuring approximately 7 mm. There is a smaller, punctate area of diffusion restriction more posteriorly in the left cerebellum. No supratentorial diffusion restriction. There is mild associated hyperintense T2 weighted signal at the sites of diffusion restriction. There is confluent periventricular and subcortical hyperintense T2 weighted signal compatible with chronic microvascular ischemia. No mass lesion or midline shift. No hydrocephalus or extra-axial fluid collection. Vascular: Major intracranial flow voids are  preserved. No evidence of chronic microhemorrhage or amyloid angiopathy. Skull and upper cervical spine: The visualized skull base, calvarium, upper cervical spine and extracranial soft tissues are normal. Sinuses/Orbits: No fluid levels or advanced mucosal thickening. No mastoid effusion. Normal orbits. IMPRESSION: Small acute infarcts of the left cerebellar hemisphere. No hemorrhage or mass effect. These results will be called to the ordering clinician or representative by the Radiologist Assistant, and communication documented in the PACS or zVision Dashboard. Electronically Signed   By: Ulyses Jarred M.D.   On: 08/01/2016 18:34   US Carotid Bilateral (at [facility] Only)  Result Date: 08/02/2016 CLINICAL DATA:  Small left cerebellar acute punctate infarcts. EXAM: BILATERAL CAROTID DUPLEX ULTRASOUND TECHNIQUE: Pearline Cables scale imaging, color Doppler and duplex ultrasound were performed of bilateral carotid and vertebral arteries in the neck. COMPARISON:  08/01/2016 MRI FINDINGS: Criteria: Quantification of carotid stenosis is based on velocity parameters that correlate the residual internal carotid diameter with NASCET-based stenosis levels, using the diameter of the distal internal carotid lumen as the denominator for stenosis measurement. The following velocity measurements were obtained: RIGHT ICA:  111/10 cm/sec CCA:  [id number] cm/sec SYSTOLIC ICA/CCA RATIO:  1.4 DIASTOLIC ICA/CCA RATIO:  0.9 ECA:  64 cm/sec LEFT ICA:  67/23 cm/sec CCA:  [id number] cm/sec SYSTOLIC ICA/CCA RATIO:  0.6 DIASTOLIC ICA/CCA RATIO:  0.9 ECA:  50 cm/sec RIGHT CAROTID ARTERY: Minor echogenic shadowing plaque formation. No hemodynamically significant right ICA stenosis, velocity elevation, or turbulent flow. Degree of narrowing less than 50%. RIGHT VERTEBRAL ARTERY:  Antegrade LEFT CAROTID ARTERY: Similar scattered minor echogenic plaque formation. No hemodynamically significant left ICA stenosis, velocity elevation, or turbulent flow. LEFT  VERTEBRAL ARTERY:  Antegrade IMPRESSION: Mild carotid atherosclerosis bilaterally. No hemodynamically significant ICA stenosis. Degree of narrowing less than 50% bilaterally. Patent antegrade vertebral flow bilaterally Electronically Signed   By: Jerilynn Mages.  Shick M.D.   On: 08/02/2016 11:00   Mr Jodene Nam Head/brain [id number] Cm  Result Date: 08/02/2016 CLINICAL DATA:  Cerebellar stroke. EXAM: MRA HEAD WITHOUT CONTRAST TECHNIQUE: Angiographic images of the Circle of Willis were obtained using MRA technique without intravenous contrast. COMPARISON:  MRI 08/01/2016, MRA head 12/26/2009 FINDINGS: Both vertebral arteries patent to the basilar. PICA patent bilaterally. Mild irregularity distal basilar compatible with atherosclerotic disease without stenosis. Severe stenosis left posterior cerebral artery. Occlusion right posterior cerebral artery. Superior cerebellar arteries are patent bilaterally. Moderate stenosis right supraclinoid internal carotid artery. Hypoplastic and diseased right A1 segment. Right A2 segment patent without stenosis. Right M1 segment patent with mild atherosclerotic disease. Moderate disease in right MCA branches similar to the prior study. Mild stenosis left internal carotid artery. Mild stenosis proximal left A1 segment and proximal left A2 segment. Moderate disease left M1 segment and left middle cerebral artery branches similar to the prior study. Negative for cerebral aneurysm. IMPRESSION: Moderate intracranial atherosclerotic disease as above. Interval occlusion right posterior cerebral artery since 2011. Electronically Signed   By: Juanda Crumble  Carlis Abbott M.D.   On: 08/02/2016 09:30    ____________________________________________   PROCEDURES  Procedure(s) performed:   Procedures  None ____________________________________________   INITIAL IMPRESSION / ASSESSMENT AND PLAN / ED COURSE  Pertinent labs & imaging results that were available during my care of the patient were reviewed by me and  considered in my medical decision making (see chart for details).  Patient presents to the emergency department for evaluation of vertigo and nausea. She does have some slight limb ataxia in her right upper extremity with finger to nose testing. Given the patient's history and exam I have lower suspicion for central vertigo but she seems to be not responding well to conservative therapy for peripheral vertigo at this time. Plan for additional nausea medication, basic labs, EKG and troponin to rule out atypical ACS presentation, an MRI of the brain to evaluate for posterior circulation stroke.   Discussed the results of the MRI with the patient and family. Answered questions. No symptoms at this time. Discussed admission with the hospitalist team and placed temporary orders  ____________________________________________  FINAL CLINICAL IMPRESSION(S) / ED DIAGNOSES  Final diagnoses:  Cerebral infarction due to unspecified mechanism  Vertigo     MEDICATIONS GIVEN DURING THIS VISIT:  Medications  atorvastatin (LIPITOR) tablet 10 mg (10 mg Oral Given 08/01/16 2100)  pantoprazole (PROTONIX) EC tablet 40 mg (40 mg Oral Given 08/02/16 1033)   stroke: mapping our early stages of recovery book (not administered)  0.9 %  sodium chloride infusion (not administered)  senna-docusate (Senokot-S) tablet 1 tablet (not administered)  aspirin chewable tablet 81 mg (81 mg Oral Given 08/02/16 1033)  fondaparinux (ARIXTRA) injection 2.5 mg (2.5 mg Subcutaneous Given 08/02/16 0723)  ondansetron (ZOFRAN-ODT) disintegrating tablet 4 mg (4 mg Oral Given 08/01/16 1811)     NEW OUTPATIENT MEDICATIONS STARTED DURING THIS VISIT:  none   Note:  This document was prepared using Dragon voice recognition software and may include unintentional dictation errors.  Nanda Quinton, MD Emergency Medicine   Margette Fast, MD 08/02/16 [phone]

## 2016-08-01 NOTE — H&P (Signed)
TRH H&P   Patient Demographics:    Evelyn Baird, is a 82 y.o. female  MRN: [id number]  DOB - Apr 05, 1930  Admit Date - 08/01/2016  Outpatient Primary MD for the patient is Vic Blackbird, MD  Referring MD/NP/PA: Dr Laverta Baltimore  Patient coming from: Home  Chief Complaint  Patient presents with  . Dizziness      HPI:    Evelyn Baird  is a 82 y.o. female, History of CAD, diabetes mellitus without complication, anemia with recent colonoscopy showed tubular adenoma and no malignancy, hypertension who came to the hospital with worsening dizziness. As per patient dizziness started on Friday last week,  and she will seen in the ED at that time she was discharged home on meclizine. She had no neurological symptoms at that time. As the patient she was getting intermittent relief from meclizine, but today symptoms got worse so she came to the hospital. She denies slurred speech, no blurred vision, no focal extremity weakness, no syncope. She denies chest pain but does complain of shortness of breath. No fever or dysuria.  MRI brain showed acute infarct in the left cerebellum. Since patient has the symptoms for past 1 week, she is out of window for TPA.   Review of systems:     A full 10 point Review of Systems was done, except as stated above, all other Review of Systems were negative.   With Past History of the following :    Past Medical History:  Diagnosis Date  . Anemia   . Coronary atherosclerosis of native coronary artery    Nonobstructive 2009 - previously followed by [facility]  . Diabetes mellitus without complication ([place])   . Essential hypertension, benign   . GERD (gastroesophageal reflux disease)   . Hyperlipidemia   . Hypertension   . Peripheral neuropathy ([place])   . Prediabetes   . PVC's (premature ventricular contractions)       Past Surgical History:  Procedure Laterality Date  . BIOPSY   07/19/2016   Procedure: BIOPSY;  Surgeon: Rogene Houston, MD;  [facility];  Service: Endoscopy;;  colon  . COLONOSCOPY N/A 07/19/2016   Procedure: COLONOSCOPY;  Surgeon: Rogene Houston, MD;  Location: [facility];  Service: Endoscopy;  Laterality: N/A;  12:25  . HERNIA REPAIR    . Left abdominal hernia repair    . POLYPECTOMY  07/19/2016   Procedure: POLYPECTOMY;  Surgeon: Rogene Houston, MD;  Location: [facility];  Service: Endoscopy;;  colon      Social History:     Social History  Substance Use Topics  . Smoking status: Never Smoker  . Smokeless tobacco: Never Used     Comment: Never smoked  . Alcohol use No        Family History :     Family History  Problem Relation Age of Onset  . Hypertension Father   . Kidney disease Father      Home Medications:   Prior to Admission medications  Medication Sig Start Date End Date Taking? Authorizing Provider  lisinopril (PRINIVIL,ZESTRIL) 20 MG tablet Take 1 tablet (20 mg total) by mouth daily. 08/18/15  Yes Lendon Colonel, NP  meclizine (ANTIVERT) 25 MG tablet Take 1 tablet (25 mg total) by mouth 4 (four) times daily as needed for dizziness. 07/27/16  Yes Rolland Porter, MD  NITROSTAT 0.4 MG SL tablet DISSOLVE (1) TABLET UNDER TONGUE AS NEEDED TO RELIEVE CHEST PAIN. MAYREPEAT EVERY 5 MINUTES. 05/28/16  Yes Lendon Colonel, NP  ondansetron (ZOFRAN) 4 MG tablet Take 1 tablet (4 mg total) by mouth every 8 (eight) hours as needed for nausea or vomiting. 07/27/16  Yes Rolland Porter, MD  pantoprazole (PROTONIX) 40 MG tablet TAKE 1 TABLET BY MOUTH ONCE DAILY. 05/28/16  Yes Lendon Colonel, NP  Pediatric Multivitamins-Iron (FLINTSTONES PLUS IRON PO) Take 1 tablet by mouth daily.   Yes Historical Provider, MD  atorvastatin (LIPITOR) 10 MG tablet Take 1 tablet (10 mg total) by mouth at bedtime. FOR CHOLESTEROL Patient not taking: Reported on 08/01/2016 04/30/16   Alycia Rossetti, MD  ferrous sulfate 325 (65 FE) MG EC tablet  Take 1 tablet (325 mg total) by mouth 2 (two) times daily with a meal. Patient not taking: Reported on 08/01/2016 05/15/16   Alycia Rossetti, MD     Allergies:     Allergies  Allergen Reactions  . Beef-Derived Products Itching and Nausea And Vomiting  . Pork-Derived Products Itching and Nausea And Vomiting     Physical Exam:   Vitals  Blood pressure 157/75, pulse 76, temperature 98.8 F (37.1 C), temperature source Oral, resp. rate 15, SpO2 99 %.  1.  General African American female, in no acute distress but appears lethargic. Cooperative with examination  2. Psychiatric: Intact judgement and  insight, awake alert, oriented x 3.  3. Neurologic:No focal neurological deficits, all cranial nerves intact. Strength 5/5 all 4 extremities, sensation intact all 4 extremities, plantars down going. Past pointing in  right upper extremity on finger to nose test.  4. Eyes show anicteric sclerae, moist conjunctivae with no lid lag. PERRLA.  5. ENMT: Oropharynx clear with moist mucous membranes and good dentition  5. Neck: supple, no cervical lymphadenopathy appriciated, No thyromegaly  6. Respiratory :Normal respiratory effort, good air movement bilaterally, clear to auscultation bilaterally.  7. Cardiovascular :RRR, no gallops, rubs or murmurs, no leg edema  8. GI: Positive bowel sounds, abdomen soft, no tenderness to palpation, no organomegaly appriciated,no rebound -guarding or rigidity.  9. Skin: No cyanosis, normal texture and turgor, no rash, lesions or ulcers  10.Musculoskeletal: Good muscle tone,  joints appear normal , no effusions, normal range of motion         Data Review:    CBC  Recent Labs Lab 07/27/16 0333 08/01/16 1726  WBC 5.3 7.4  HGB 10.2* 10.6*  HCT 31.5* 32.2*  PLT 176 206  MCV 74.3* 73.5*  MCH 24.1* 24.2*  MCHC 32.4 32.9  RDW 23.6* 23.6*  LYMPHSABS 1.4 1.3  MONOABS 0.3 0.4  EOSABS 0.0 0.0  BASOSABS 0.1 0.1    ------------------------------------------------------------------------------------------------------------------  Chemistries   Recent Labs Lab 07/27/16 0333 08/01/16 1726  NA 140 135  K 3.2* 3.5  CL 105 107  CO2 24 24  GLUCOSE 127* 99  BUN 5* 13  CREATININE 0.77 0.72  CALCIUM 8.4* 8.1*  AST 36  --   ALT 22  --   ALKPHOS 80  --   BILITOT 0.2*  --    ------------------------------------------------------------------------------------------------------------------  ------------------------------------------------------------------------------------------------------------------  GFR: Estimated Creatinine Clearance: 49 mL/min (by C-G formula based on SCr of 0.72 mg/dL). Liver Function Tests:  Recent Labs Lab 07/27/16 0333  AST 36  ALT 22  ALKPHOS 80  BILITOT 0.2*  PROT 6.7  ALBUMIN 3.2*   Cardiac Enzymes:  Recent Labs Lab 07/27/16 0333  TROPONINI <0.03    CBG:  Recent Labs Lab 08/01/16 1637  GLUCAP 94    --------------------------------------------------------------------------------------------------------------- Urine analysis:    Component Value Date/Time   COLORURINE YELLOW 08/01/2016 1718   APPEARANCEUR CLEAR 08/01/2016 1718   LABSPEC 1.025 08/01/2016 1718   PHURINE 6.0 08/01/2016 1718   GLUCOSEU NEGATIVE 08/01/2016 1718   HGBUR TRACE (A) 08/01/2016 1718   BILIRUBINUR NEGATIVE 08/01/2016 1718   KETONESUR NEGATIVE 08/01/2016 1718   PROTEINUR 30 (A) 08/01/2016 1718   UROBILINOGEN 0.2 04/26/2014 1226   NITRITE NEGATIVE 08/01/2016 1718   LEUKOCYTESUR MODERATE (A) 08/01/2016 1718      ----------------------------------------------------------------------------------------------------------------   Imaging Results:    Mr Brain Wo Contrast  Result Date: 08/01/2016 CLINICAL DATA:  Dizziness, nausea and generalized weakness. EXAM: MRI HEAD WITHOUT CONTRAST TECHNIQUE: Multiplanar, multiecho pulse sequences of the brain and surrounding  structures were obtained without intravenous contrast. COMPARISON:  Head CT 02/27/2015 FINDINGS: Brain: There is focal diffusion restriction within the left cerebellar hemisphere, measuring approximately 7 mm. There is a smaller, punctate area of diffusion restriction more posteriorly in the left cerebellum. No supratentorial diffusion restriction. There is mild associated hyperintense T2 weighted signal at the sites of diffusion restriction. There is confluent periventricular and subcortical hyperintense T2 weighted signal compatible with chronic microvascular ischemia. No mass lesion or midline shift. No hydrocephalus or extra-axial fluid collection. Vascular: Major intracranial flow voids are preserved. No evidence of chronic microhemorrhage or amyloid angiopathy. Skull and upper cervical spine: The visualized skull base, calvarium, upper cervical spine and extracranial soft tissues are normal. Sinuses/Orbits: No fluid levels or advanced mucosal thickening. No mastoid effusion. Normal orbits. IMPRESSION: Small acute infarcts of the left cerebellar hemisphere. No hemorrhage or mass effect. These results will be called to the ordering clinician or representative by the Radiologist Assistant, and communication documented in the PACS or zVision Dashboard. Electronically Signed   By: Ulyses Jarred M.D.   On: 08/01/2016 18:34    My personal review of EKG: Rhythm NSR   Assessment & Plan:    Active Problems:   CVA (cerebral infarction)   Cerebellar stroke ([place])   1. Cerebellar stroke- MRI brain showed small acute infarct of left cerebellar hemisphere, admitted the patient for stroke workup. Will check MRA between, echocardiogram, carotid Dopplers, hemoglobin A1c, lipid profile. We will obtain PT OT consultation. Start patient on aspirin 81 mg by mouth daily. 2. Hypertension- blood pressure stable, hold lisinopril for permissive hypertension. 3. CAD- stable, EKG showed no significant changes. Check chest  x-ray as patient complained of shortness of breath. 4.    DVT Prophylaxis-   Arixtra, patient has allergy to Lovenox and heparin  AM Labs Ordered, also please review Full Orders  Family Communication: Admission, patients condition and plan of care including tests being ordered have been discussed with the patient and her son at bedside who indicate understanding and agree with the plan and Code Status.  Code Status:  Full code  Admission status: Inpatient    Time spent in minutes : 60 minutes   Shanetra Blumenstock S M.D on 08/01/2016 at 8:18 PM  Between 7am to 7pm - Pager - [phone]. After 7pm go to [email or web address] - password [facility]  [facility] - Office  [phone]

## 2016-08-01 NOTE — ED Triage Notes (Signed)
Pt was here 5 days ago for the same type of symptoms Dizziness, nausea, generalized weakness Unstable gait Stroke scale negative  12 lead NSR Glucose:100

## 2016-08-02 ENCOUNTER — Ambulatory Visit: Payer: Medicare Other | Admitting: Family Medicine

## 2016-08-02 ENCOUNTER — Inpatient Hospital Stay (HOSPITAL_COMMUNITY): Payer: Medicare Other

## 2016-08-02 DIAGNOSIS — I639 Cerebral infarction, unspecified: Principal | ICD-10-CM

## 2016-08-02 DIAGNOSIS — K219 Gastro-esophageal reflux disease without esophagitis: Secondary | ICD-10-CM

## 2016-08-02 DIAGNOSIS — I10 Essential (primary) hypertension: Secondary | ICD-10-CM

## 2016-08-02 DIAGNOSIS — I6789 Other cerebrovascular disease: Secondary | ICD-10-CM

## 2016-08-02 DIAGNOSIS — E785 Hyperlipidemia, unspecified: Secondary | ICD-10-CM

## 2016-08-02 LAB — LIPID PANEL
CHOL/HDL RATIO: 7.6 ratio
Cholesterol: 274 mg/dL — ABNORMAL HIGH (ref 0–200)
HDL: 36 mg/dL — ABNORMAL LOW (ref >40)
LDL CALC: 213 mg/dL — AB (ref 0–99)
Triglycerides: 123 mg/dL (ref <150)
VLDL: 25 mg/dL (ref 0–40)

## 2016-08-02 LAB — ECHOCARDIOGRAM COMPLETE
HEIGHTINCHES: 65 in
WEIGHTICAEL: 2102.31 [oz_av]

## 2016-08-02 MED ORDER — ATORVASTATIN CALCIUM 20 MG PO TABS: 20.0000 mg | ORAL_TABLET | Freq: Every day | ORAL | 0 refills | Status: DC

## 2016-08-02 MED ORDER — FONDAPARINUX SODIUM 2.5 MG/0.5ML ~~LOC~~ SOLN
SUBCUTANEOUS | Status: AC
  Filled 2016-08-02: qty 0.5

## 2016-08-02 MED ORDER — ASPIRIN 81 MG PO CHEW: 81.0000 mg | CHEWABLE_TABLET | Freq: Every day | ORAL | 0 refills | Status: AC

## 2016-08-02 NOTE — Progress Notes (Addendum)
Patient's daughter, Janeice Robinson, and son, Mallie Mussel called but unable to reach and voice mail not set up for [place].  Patient contacting a friend for transportation home.

## 2016-08-02 NOTE — Progress Notes (Signed)
*  PRELIMINARY RESULTS* Echocardiogram 2D Echocardiogram has been performed.  Leavy Cella 08/02/2016, 2:53 PM

## 2016-08-02 NOTE — Care Management Important Message (Signed)
Important Message  Patient Details  Name: Evelyn Baird MRN: [id number] Date of Birth: 1930/07/22   Medicare Important Message Given:  Yes    Sherald Barge, RN 08/02/2016, 2:25 PM

## 2016-08-02 NOTE — Evaluation (Signed)
Physical Therapy Evaluation Patient Details Name: Evelyn Baird MRN: [id number] DOB: [birth year] Today's Date: 08/02/2016   History of Present Illness  82 y.o. female, History of CAD, diabetes mellitus without complication, anemia with recent colonoscopy showed tubular adenoma and no malignancy, hypertension who came to the hospital with worsening dizziness. As per patient dizziness started on Friday last week, and she was seen in the ED and discharged home on meclizine. She had no neurological symptoms at that time.  The patient she was getting intermittent relief from meclizine, but today symptoms got worse so she came to the hospital. She denies slurred speech, no blurred vision, no focal extremity weakness, no syncope. She denies chest pain but does complain of shortness of breath. No fever or dysuria.  MRI brain showed acute infarct in the left cerebellum. PMH: Anemia, coronary atherosclerosis, DM, HTN, peripheral neuropathy, PVC's, hernia repair.  Clinical Impression  Pt received in bed, and was agreeable to PT evaluation.  Pt normally lives alone, but has been staying with her dtr due to the last dizzy spell.  Pt will d/c back home with dtr.  Pt was able to ambulate 27ft with RW and min guard.  However, she normally does not use a RW.  Recommend that HHPT see her to improve strength, balance, and functional mobility.      Follow Up Recommendations Home health PT    Equipment Recommendations  None recommended by PT    Recommendations for Other Services       Precautions / Restrictions Precautions Precautions: Fall Precaution Comments: Pt had a previous dizzy spell and fell back into a cabinet and slid down to the floor.  Restrictions Weight Bearing Restrictions: No      Mobility  Bed Mobility Overal bed mobility: Modified Independent                Transfers Overall transfer level: Needs assistance Equipment used: Rolling walker (2 wheeled);None Transfers: Sit to/from  Stand Sit to Stand: Min guard            Ambulation/Gait Ambulation/Gait assistance: Min guard Ambulation Distance (Feet): 80 Feet Assistive device: Rolling walker (2 wheeled);None Gait Pattern/deviations: Step-through pattern     General Gait Details: Had pt ambulate from the bed<>bathroom (x12ft) with no device, however she was very unsteady, reaching for the walls and the therapist to hold on to.  Therefore, after finishing on the toilet, she utilized a RW and ambualted 92ft.  For the last 15ft she ambulated with no device again, and demostrated much improved balance.  However, RW is still recomended.   Stairs            Wheelchair Mobility    Modified Rankin (Stroke Patients Only)       Balance Overall balance assessment: Needs assistance         Standing balance support: Bilateral upper extremity supported Standing balance-Leahy Scale: Fair                               Pertinent Vitals/Pain Pain Assessment: No/denies pain    Home Living Family/patient expects to be discharged to:: Private residence Living Arrangements: Alone (staying with daughter for past week) Available Help at Discharge: Family;Available PRN/intermittently Type of Home: Apartment (2nd level with elevator)       Home Layout: One level Home Equipment: [place] - 2 wheels;Cane - single point;Shower seat      Prior Function Level of Independence: Independent  with assistive device(s)   Gait / Transfers Assistance Needed: sometimes uses the RW when she walks to the drug store.   ADL's / Homemaking Assistance Needed: independent.         Hand Dominance   Dominant Hand: Right    Extremity/Trunk Assessment   Upper Extremity Assessment: Defer to OT evaluation           Lower Extremity Assessment: Generalized weakness         Communication   Communication: No difficulties  Cognition Arousal/Alertness: Awake/alert Behavior During Therapy: WFL for tasks  assessed/performed Overall Cognitive Status: Within Functional Limits for tasks assessed                      General Comments      Exercises     Assessment/Plan    PT Assessment Patient needs continued PT services  PT Problem List Decreased strength;Decreased activity tolerance;Decreased balance;Decreased mobility;Decreased safety awareness;Decreased knowledge of precautions          PT Treatment Interventions DME instruction;Stair training;Functional mobility training;Therapeutic activities;Therapeutic exercise;Balance training;Patient/family education;Neuromuscular re-education    PT Goals (Current goals can be found in the Care Plan section)  Acute Rehab PT Goals Patient Stated Goal: pt wants to get strong enough to go back home.  PT Goal Formulation: With patient Time For Goal Achievement: 09/22/16 Potential to Achieve Goals: Good    Frequency 7X/week   Barriers to discharge Decreased caregiver support Normally lives alone, but she plans to d/c to her daughter's house.     Co-evaluation PT/OT/SLP Co-Evaluation/Treatment: Yes Reason for Co-Treatment: Complexity of the patient's impairments (multi-system involvement) PT goals addressed during session: Mobility/safety with mobility;Balance;Proper use of DME OT goals addressed during session: ADL's and self-care;Proper use of Adaptive equipment and DME       End of Session Equipment Utilized During Treatment: Gait belt Activity Tolerance: Patient tolerated treatment well Patient left: in chair;with call bell/phone within reach      Functional Assessment Tool Used: [employer] "6-clicks"  Functional Limitation: Mobility: Walking and moving around Mobility: Walking and Moving Around Current Status [phone]): At least 20 percent but less than 40 percent impaired, limited or restricted Mobility: Walking and Moving Around Goal Status [phone]): At least 1 percent but less than 20 percent impaired,  limited or restricted    Time: 1112-1135 PT Time Calculation (min) (ACUTE ONLY): 23 min   Charges:   PT Evaluation $PT Eval Low Complexity: 1 Procedure     PT G Codes:   PT G-Codes **NOT FOR INPATIENT CLASS** Functional Assessment Tool Used: [employer] "6-clicks"  Functional Limitation: Mobility: Walking and moving around Mobility: Walking and Moving Around Current Status [phone]): At least 20 percent but less than 40 percent impaired, limited or restricted Mobility: Walking and Moving Around Goal Status [phone]): At least 1 percent but less than 20 percent impaired, limited or restricted    Beth Rosa [place], PT, DPT X: [phone]

## 2016-08-02 NOTE — Evaluation (Signed)
Occupational Therapy Evaluation Patient Details Name: Evelyn Baird MRN: [id number] DOB: 22-Aug-1930 Today's Date: 08/02/2016    History of Present Illness 82 y.o. female, History of CAD, diabetes mellitus without complication, anemia with recent colonoscopy showed tubular adenoma and no malignancy, hypertension who came to the hospital with worsening dizziness. As per patient dizziness started on Friday last week, and she was seen in the ED and discharged home on meclizine. She had no neurological symptoms at that time.  The patient she was getting intermittent relief from meclizine, but today symptoms got worse so she came to the hospital. She denies slurred speech, no blurred vision, no focal extremity weakness, no syncope. She denies chest pain but does complain of shortness of breath. No fever or dysuria.  MRI brain showed acute infarct in the left cerebellum. PMH: Anemia, coronary atherosclerosis, DM, HTN, peripheral neuropathy, PVC's, hernia repair.   Clinical Impression   Pt seen for evaluation with PT, oriented x4, agreeable to evaluation. Pt demonstrates ADL completion with min guard due to unsteadiness with standing tasks and functional mobility. No further OT services at this time, supervision recommended on discharge for safety during ADL task completion.     Follow Up Recommendations  No OT follow up          Precautions / Restrictions Precautions Precautions: Fall Precaution Comments: Pt had a previous dizzy spell and fell back into a cabinet and slid down to the floor.  Restrictions Weight Bearing Restrictions: No      Mobility Bed Mobility Overal bed mobility: Modified Independent                Transfers Overall transfer level: Needs assistance Equipment used: Rolling walker (2 wheeled);None Transfers: Sit to/from Stand Sit to Stand: Min guard                   ADL Overall ADL's : Needs assistance/impaired     Grooming: Wash/dry  hands;Standing               Lower Body Dressing: Supervision/safety;Sitting/lateral leans   Toilet Transfer: Supervision/safety;Min Surveyor, minerals- Clothing Manipulation and Hygiene: Supervision/safety;Min guard;Sit to/from stand       Functional mobility during ADLs: Min guard;Rolling walker                 Pertinent Vitals/Pain Pain Assessment: No/denies pain     Hand Dominance Right   Extremity/Trunk Assessment Upper Extremity Assessment Upper Extremity Assessment: Defer to OT evaluation   Lower Extremity Assessment Lower Extremity Assessment: Generalized weakness       Communication Communication Communication: No difficulties   Cognition Arousal/Alertness: Awake/alert Behavior During Therapy: WFL for tasks assessed/performed Overall Cognitive Status: Within Functional Limits for tasks assessed                                Home Living Family/patient expects to be discharged to:: Private residence Living Arrangements: Alone (staying with daughter for past week) Available Help at Discharge: Family;Available PRN/intermittently Type of Home: Apartment (2nd level with elevator)       Home Layout: One level   Alternate Level Stairs-Rails: Can reach both Bathroom Shower/Tub: Teacher, early years/pre: Standard     Home Equipment: Environmental consultant - 2 wheels;Cane - single point;Shower seat          Prior Functioning/Environment Level of Independence: Independent with assistive device(s)  Gait / Transfers Assistance Needed:  sometimes uses the RW when she walks to the drug store.  ADL's / Homemaking Assistance Needed: independent.             OT Problem List: Decreased activity tolerance   OT Treatment/Interventions:      OT Goals(Current goals can be found in the care plan section) Acute Rehab OT Goals Patient Stated Goal: pt wants to get strong enough to go back home.   OT Frequency:              Co-evaluation PT/OT/SLP Co-Evaluation/Treatment: Yes Reason for Co-Treatment: Complexity of the patient's impairments (multi-system involvement) PT goals addressed during session: Mobility/safety with mobility;Balance;Proper use of DME OT goals addressed during session: ADL's and self-care;Proper use of Adaptive equipment and DME      End of Session Equipment Utilized During Treatment: Gait belt;Rolling walker  Activity Tolerance: Patient tolerated treatment well Patient left: in chair;with call bell/phone within reach   Time: [id number] OT Time Calculation (min): 23 min Charges:  OT General Charges $OT Visit: 1 Procedure OT Evaluation $OT Eval Low Complexity: 1 Procedure Guadelupe Sabin, OTR/L  [phone] 08/02/2016, 2:25 PM

## 2016-08-02 NOTE — Progress Notes (Signed)
*  PRELIMINARY RESULTS* Echocardiogram 2D Echocardiogram has been performed.  Leavy Cella 08/02/2016, 3:58 PM

## 2016-08-02 NOTE — Progress Notes (Signed)
Patient with orders to be discharge home. Discharge instructions given, patient verbalized understanding. Patient stable. Patient left in private vehicle with family.  

## 2016-08-02 NOTE — Care Management Note (Signed)
Case Management Note  Patient Details  Name: Evelyn Baird MRN: [id number] Date of Birth: Apr 09, 1930  Subjective/Objective:                  Pt is from home. She has been recently staying with her daughter. She plans to return to her daughters home at DC. She will need PT/OT and nursing services at DC. Pt has chosen [facility] from list of [facility] providers. Romualdo Bolk, of [facility], is aware of referral and will obtain pt info from chat. She is aware that [facility] has 48 hrs to initiate services. Pt has no DME needs at this time.   Action/Plan: Anticipate DC home with [facility] services.   Expected Discharge Date:    08/03/2016              Expected Discharge Plan:  [place]  In-House Referral:  NA  Discharge planning Services  CM Consult  Post Acute Care Choice:  Home Health Choice offered to:  Patient  DME Arranged:    DME Agency:     HH Arranged:  RN, PT, OT HH Agency:  [facility]  Status of Service:  Completed, signed off  If discussed at [place] of Stay Meetings, dates discussed:    Additional Comments:  Sherald Barge, RN 08/02/2016, 2:22 PM

## 2016-08-02 NOTE — Progress Notes (Signed)
Patient off floor for testing procedures, unable to do Stroke vital signs,and assessment at this time.

## 2016-08-02 NOTE — Discharge Summary (Signed)
Physician Discharge Summary  RHODORA KAUTZ GR:[id number] DOB: 08-Nov-1930 DOA: 08/01/2016  PCP: Vic Blackbird, MD  Admit date: 08/01/2016 Discharge date: 08/02/2016  Admitted From: home Disposition:  home  Recommendations for Outpatient Follow-up:  1. Follow up with PCP in 1-2 weeks 2. Please obtain BMP/CBC in one week  Home Health: Equipment/Devices:  Discharge Condition: stable CODE STATUS: full Diet recommendation: Heart Healthy   Brief/Interim Summary: Patient presented to the hospital with complaints of dizziness. She was seen in the ER last week and felt to have possible vertigo was discharged with meclizine. When her symptoms persisted, she came back to the ER for evaluation. MRI of the brain indicating cerebral stroke. She was admitted to the hospital for further evaluation. She reports being taken off her aspirin and statin approximately 3 weeks ago, but cannot tell me why. Carotid Dopplers and echocardiogram were found be unremarkable. LDL was noted to be elevated. She was restarted on aspirin as well as statin therapy. She was seen by physical therapy recommended home health physical therapy. Neurology also evaluated the patient and agreed with present plan of care. She appears stable for discharge home.  Discharge Diagnoses:  Active Problems:   Hyperlipidemia   Essential hypertension, benign   GERD   CVA (cerebral infarction)   Cerebellar stroke [facility])    Discharge Instructions  Discharge Instructions    Diet - low sodium heart healthy    Complete by:  As directed    Increase activity slowly    Complete by:  As directed        Medication List    TAKE these medications   aspirin 81 MG chewable tablet Chew 1 tablet (81 mg total) by mouth daily. Start taking on:  08/03/2016   atorvastatin 20 MG tablet Commonly known as:  LIPITOR Take 1 tablet (20 mg total) by mouth at bedtime. FOR CHOLESTEROL What changed:  medication strength  how much to take    ferrous sulfate 325 (65 FE) MG EC tablet Take 1 tablet (325 mg total) by mouth 2 (two) times daily with a meal.   FLINTSTONES PLUS IRON PO Take 1 tablet by mouth daily.   lisinopril 20 MG tablet Commonly known as:  PRINIVIL,ZESTRIL Take 1 tablet (20 mg total) by mouth daily.   meclizine 25 MG tablet Commonly known as:  ANTIVERT Take 1 tablet (25 mg total) by mouth 4 (four) times daily as needed for dizziness.   NITROSTAT 0.4 MG SL tablet Generic drug:  nitroGLYCERIN DISSOLVE (1) TABLET UNDER TONGUE AS NEEDED TO RELIEVE CHEST PAIN. MAYREPEAT EVERY 5 MINUTES.   ondansetron 4 MG tablet Commonly known as:  ZOFRAN Take 1 tablet (4 mg total) by mouth every 8 (eight) hours as needed for nausea or vomiting.   pantoprazole 40 MG tablet Commonly known as:  PROTONIX TAKE 1 TABLET BY MOUTH ONCE DAILY.       Allergies  Allergen Reactions  . Beef-Derived Products Itching and Nausea And Vomiting  . Pork-Derived Products Itching and Nausea And Vomiting    Consultations:  Neurology   Procedures/Studies: Dg Chest 2 View  Result Date: 08/01/2016 CLINICAL DATA:  Shortness of breath, weakness and dizziness. Possible stroke. EXAM: CHEST  2 VIEW COMPARISON:  12/18/2015 FINDINGS: The heart is enlarged but stable. Moderate tortuosity, ectasia and calcification of the thoracic aorta. Chronic lung changes but no acute pulmonary findings. Calcified granulomas are noted. No worrisome pulmonary lesions. Suspect small bilateral pleural effusions. IMPRESSION: Cardiac enlargement and tortuous, ectatic calcified thoracic aorta.  Chronic lung changes but no acute pulmonary findings. Suspect small bilateral pleural effusions. Electronically Signed   By: Marijo Sanes M.D.   On: 08/01/2016 23:16   Dg Abd 1 View  Result Date: 07/19/2016 CLINICAL DATA:  Transverse colon cancer.  Clip site location. EXAM: ABDOMEN - 1 VIEW COMPARISON:  05/12/2016 CT abdomen/ pelvis. FINDINGS: Surgical clip overlies the right  mid abdomen. No disproportionately dilated small bowel loops. Minimal colonic stool. No evidence of pneumatosis or pneumoperitoneum. Moderate lumbar spondylosis. Degenerative changes in both hips, asymmetric to the left. IMPRESSION: Surgical clip overlies the right mid abdomen. Nonobstructive bowel gas pattern. Electronically Signed   By: Ilona Sorrel M.D.   On: 07/19/2016 14:47   Mr Brain Wo Contrast  Result Date: 08/01/2016 CLINICAL DATA:  Dizziness, nausea and generalized weakness. EXAM: MRI HEAD WITHOUT CONTRAST TECHNIQUE: Multiplanar, multiecho pulse sequences of the brain and surrounding structures were obtained without intravenous contrast. COMPARISON:  Head CT 02/27/2015 FINDINGS: Brain: There is focal diffusion restriction within the left cerebellar hemisphere, measuring approximately 7 mm. There is a smaller, punctate area of diffusion restriction more posteriorly in the left cerebellum. No supratentorial diffusion restriction. There is mild associated hyperintense T2 weighted signal at the sites of diffusion restriction. There is confluent periventricular and subcortical hyperintense T2 weighted signal compatible with chronic microvascular ischemia. No mass lesion or midline shift. No hydrocephalus or extra-axial fluid collection. Vascular: Major intracranial flow voids are preserved. No evidence of chronic microhemorrhage or amyloid angiopathy. Skull and upper cervical spine: The visualized skull base, calvarium, upper cervical spine and extracranial soft tissues are normal. Sinuses/Orbits: No fluid levels or advanced mucosal thickening. No mastoid effusion. Normal orbits. IMPRESSION: Small acute infarcts of the left cerebellar hemisphere. No hemorrhage or mass effect. These results will be called to the ordering clinician or representative by the Radiologist Assistant, and communication documented in the PACS or zVision Dashboard. Electronically Signed   By: Ulyses Jarred M.D.   On: 08/01/2016 18:34    US Carotid Bilateral (at [facility] Only)  Result Date: 08/02/2016 CLINICAL DATA:  Small left cerebellar acute punctate infarcts. EXAM: BILATERAL CAROTID DUPLEX ULTRASOUND TECHNIQUE: Pearline Cables scale imaging, color Doppler and duplex ultrasound were performed of bilateral carotid and vertebral arteries in the neck. COMPARISON:  08/01/2016 MRI FINDINGS: Criteria: Quantification of carotid stenosis is based on velocity parameters that correlate the residual internal carotid diameter with NASCET-based stenosis levels, using the diameter of the distal internal carotid lumen as the denominator for stenosis measurement. The following velocity measurements were obtained: RIGHT ICA:  111/10 cm/sec CCA:  [id number] cm/sec SYSTOLIC ICA/CCA RATIO:  1.4 DIASTOLIC ICA/CCA RATIO:  0.9 ECA:  64 cm/sec LEFT ICA:  67/23 cm/sec CCA:  [id number] cm/sec SYSTOLIC ICA/CCA RATIO:  0.6 DIASTOLIC ICA/CCA RATIO:  0.9 ECA:  50 cm/sec RIGHT CAROTID ARTERY: Minor echogenic shadowing plaque formation. No hemodynamically significant right ICA stenosis, velocity elevation, or turbulent flow. Degree of narrowing less than 50%. RIGHT VERTEBRAL ARTERY:  Antegrade LEFT CAROTID ARTERY: Similar scattered minor echogenic plaque formation. No hemodynamically significant left ICA stenosis, velocity elevation, or turbulent flow. LEFT VERTEBRAL ARTERY:  Antegrade IMPRESSION: Mild carotid atherosclerosis bilaterally. No hemodynamically significant ICA stenosis. Degree of narrowing less than 50% bilaterally. Patent antegrade vertebral flow bilaterally Electronically Signed   By: Jerilynn Mages.  Shick M.D.   On: 08/02/2016 11:00   Mr Jodene Nam Head/brain [id number] Cm  Result Date: 08/02/2016 CLINICAL DATA:  Cerebellar stroke. EXAM: MRA HEAD WITHOUT CONTRAST TECHNIQUE: Angiographic images of the [place] were obtained using MRA technique without intravenous contrast. COMPARISON:  MRI 08/01/2016, MRA head 12/26/2009 FINDINGS: Both vertebral arteries patent to the basilar. PICA patent  bilaterally. Mild irregularity distal basilar compatible with atherosclerotic disease without stenosis. Severe stenosis left posterior cerebral artery. Occlusion right posterior cerebral artery. Superior cerebellar arteries are patent bilaterally. Moderate stenosis right supraclinoid internal carotid artery. Hypoplastic and diseased right A1 segment. Right A2 segment patent without stenosis. Right M1 segment patent with mild atherosclerotic disease. Moderate disease in right MCA branches similar to the prior study. Mild stenosis left internal carotid artery. Mild stenosis proximal left A1 segment and proximal left A2 segment. Moderate disease left M1 segment and left middle cerebral artery branches similar to the prior study. Negative for cerebral aneurysm. IMPRESSION: Moderate intracranial atherosclerotic disease as above. Interval occlusion right posterior cerebral artery since 2011. Electronically Signed   By: Franchot Gallo M.D.   On: 08/02/2016 09:30    Echo: - Left ventricle: The cavity size was normal. Wall thickness was   normal. Systolic function was normal. The estimated ejection   fraction was in the range of 60% to 65%. Wall motion was normal;   there were no regional wall motion abnormalities. Doppler   parameters are consistent with abnormal left ventricular   relaxation (grade 1 diastolic dysfunction). - Aortic valve: Mildly calcified annulus. Trileaflet; mildly   thickened leaflets. Valve area (VTI): 2.2 cm^2. Valve area   (Vmax): 1.48 cm^2. - Left atrium: The atrium was mildly dilated. - Pulmonary arteries: Systolic pressure was mildly increased. PA   peak pressure: 32 mm Hg (S).   Subjective: No new complaints  Discharge Exam: Vitals:   08/02/16 1230 08/02/16 1430  BP: 113/64 (!) 100/58  Pulse: 84 75  Resp: 18 17  Temp: 98.4 F (36.9 C) 98.6 F (37 C)   Vitals:   08/02/16 0744 08/02/16 1030 08/02/16 1230 08/02/16 1430  BP: 130/62 108/64 113/64 (!) 100/58  Pulse: 71  74 84 75  Resp: (!) 24 (!) 24 18 17   Temp: 98.8 F (37.1 C) 98.6 F (37 C) 98.4 F (36.9 C) 98.6 F (37 C)  TempSrc: Oral Oral Oral Oral  SpO2: 98% 97% 100% 97%  Weight:      Height:        General: Pt is alert, awake, not in acute distress Cardiovascular: RRR, S1/S2 +, no rubs, no gallops Respiratory: CTA bilaterally, no wheezing, no rhonchi Abdominal: Soft, NT, ND, bowel sounds + Extremities: no edema, no cyanosis    The results of significant diagnostics from this hospitalization (including imaging, microbiology, ancillary and laboratory) are listed below for reference.     Microbiology: No results found for this or any previous visit (from the past 240 hour(s)).   Labs: BNP (last 3 results) No results for input(s): BNP in the last 8760 hours. Basic Metabolic Panel:  Recent Labs Lab 07/27/16 0333 08/01/16 1726  NA 140 135  K 3.2* 3.5  CL 105 107  CO2 24 24  GLUCOSE 127* 99  BUN 5* 13  CREATININE 0.77 0.72  CALCIUM 8.4* 8.1*   Liver Function Tests:  Recent Labs Lab 07/27/16 0333  AST 36  ALT 22  ALKPHOS 80  BILITOT 0.2*  PROT 6.7  ALBUMIN 3.2*   No results for input(s): LIPASE, AMYLASE in the last 168 hours. No results for input(s): AMMONIA in the last 168 hours. CBC:  Recent Labs Lab 07/27/16 0333 08/01/16 1726  WBC 5.3 7.4  NEUTROABS 3.5 5.6  HGB 10.2* 10.6*  HCT 31.5* 32.2*  MCV 74.3* 73.5*  PLT 176 206   Cardiac Enzymes:  Recent Labs Lab 07/27/16 0333  TROPONINI <0.03   BNP: Invalid input(s): POCBNP CBG:  Recent Labs Lab 08/01/16 1637  GLUCAP 94   D-Dimer No results for input(s): DDIMER in the last 72 hours. Hgb A1c No results for input(s): HGBA1C in the last 72 hours. Lipid Profile  Recent Labs  08/02/16 0630  CHOL 274*  HDL 36*  LDLCALC 213*  TRIG 123  CHOLHDL 7.6   Thyroid function studies No results for input(s): TSH, T4TOTAL, T3FREE, THYROIDAB in the last 72 hours.  Invalid input(s): FREET3 Anemia  work up No results for input(s): VITAMINB12, FOLATE, FERRITIN, TIBC, IRON, RETICCTPCT in the last 72 hours. Urinalysis    Component Value Date/Time   COLORURINE YELLOW 08/01/2016 1718   APPEARANCEUR CLEAR 08/01/2016 1718   LABSPEC 1.025 08/01/2016 1718   PHURINE 6.0 08/01/2016 1718   GLUCOSEU NEGATIVE 08/01/2016 1718   HGBUR TRACE (A) 08/01/2016 1718   BILIRUBINUR NEGATIVE 08/01/2016 1718   KETONESUR NEGATIVE 08/01/2016 1718   PROTEINUR 30 (A) 08/01/2016 1718   UROBILINOGEN 0.2 04/26/2014 1226   NITRITE NEGATIVE 08/01/2016 1718   LEUKOCYTESUR MODERATE (A) 08/01/2016 1718   Sepsis Labs Invalid input(s): PROCALCITONIN,  WBC,  LACTICIDVEN Microbiology No results found for this or any previous visit (from the past 240 hour(s)).   Time coordinating discharge: Over 30 minutes  SIGNED:   Kathie Dike, MD  [facility] 08/02/2016, 5:00 PM Pager   If 7PM-7AM, please contact night-coverage [email or web address] Password [email or web address]

## 2016-08-02 NOTE — Consult Note (Signed)
Evelyn A. [name], MD     [email or web address]          Evelyn Baird is an 82 y.o. female.   ASSESSMENT/PLAN: 1. Small infarct involving the inferior cerebellum on the left. This is most likely small vessel disease. Risk factors diabetes, hypertension and age. Agree with initiation of aspirin and restarting the patient on statin medication. Physical therapy. Follow-up echo.  2. Multivessel intracranial occlusive disease. I think these are most likely is symptomatic.  3. Extensive microvascular ischemic changes. This increased risk of long-term cognitive impairment in gait impairment.    Patient 82 year old black female who presented with gait instability about a week ago. She had initial workup in the emergency room. The workup appears to be nonrevealing. She also had some dizziness with this. The patient decided to return because of recurrent dizziness and gait instability. The dizziness is described mostly as lightheadedness and disequilibrium. The patient denies focal numbness or weakness associated with these symptoms. She does report having some chronic left leg numbness, burning and coldness along with swelling. She denies any headaches. She seemed to have had some chronic chest discomfort in the past. She denies shortness of breath. No GI GU symptoms are reported. She tells me that she was taken off her cholesterol medication and aspirin. She cannot relate why this was discontinued. She reports that this was discontinued about 3 weeks ago. She is noted to have ptosis on the left. She temperature that she has noticed this about 2-3 weeks ago. This is associated with itching and some swelling of the left eyelid. The review systems otherwise negative.   GENERAL: This a very pleasant female in no acute distress.  HEENT: Neck is supple. Head is normocephalic atraumatic. No scalp tenderness.  ABDOMEN: soft  EXTREMITIES: No edema   BACK: Normal  SKIN: Normal by  inspection.    MENTAL STATUS: Alert and oriented - including orientation to age date and month. Speech, language and cognition are generally intact. Judgment and insight normal.   CRANIAL NERVES: Pupils are equal, round and reactive to light and accomodation; extra ocular movements are full, there is no significant nystagmus; visual fields are full; upper and lower facial muscles are normal in strength and symmetric, there is no flattening of the nasolabial folds; tongue is midline; uvula is midline; shoulder elevation is normal. No visual extinction to double simultaneous stimulation.  MOTOR: Normal tone, bulk and strength; no pronator drift.   COORDINATION: Left finger to nose is normal, right finger to nose is normal, No rest tremor; no intention tremor; no postural tremor; no bradykinesia.  REFLEXES: Deep tendon reflexes are symmetrical and normal. Babinski reflexes are flexor bilaterally.   SENSATION: Normal to light touch. No neglect appreciated.  GAIT: This is slightly wide base. She does ambulate by holding on. Overall gait seems only mildly unsteady. She turns 180 and 5 steps.    NIH stroke scale 0.   Blood pressure 113/64, pulse 84, temperature 98.4 F (36.9 C), temperature source Oral, resp. rate 18, height '5\' 5"'$  (1.651 m), weight 131 lb 6.3 oz (59.6 kg), SpO2 100 %.  Past Medical History:  Diagnosis Date  . Anemia   . Coronary atherosclerosis of native coronary artery    Nonobstructive 2009 - previously followed by [facility]  . Diabetes mellitus without complication ([place])   . Essential hypertension, benign   . GERD (gastroesophageal reflux disease)   . Hyperlipidemia   . Hypertension   . Peripheral neuropathy ([place])   .  Prediabetes   . PVC's (premature ventricular contractions)     Past Surgical History:  Procedure Laterality Date  . BIOPSY  07/19/2016   Procedure: BIOPSY;  Surgeon: Rogene Houston, MD;  [facility];  Service: Endoscopy;;  colon  .  COLONOSCOPY N/A 07/19/2016   Procedure: COLONOSCOPY;  Surgeon: Rogene Houston, MD;  Location: [facility];  Service: Endoscopy;  Laterality: N/A;  12:25  . HERNIA REPAIR    . Left abdominal hernia repair    . POLYPECTOMY  07/19/2016   Procedure: POLYPECTOMY;  Surgeon: Rogene Houston, MD;  Location: [facility];  Service: Endoscopy;;  colon    Family History  Problem Relation Age of Onset  . Hypertension Father   . Kidney disease Father     Social History:  reports that she has never smoked. She has never used smokeless tobacco. She reports that she does not drink alcohol or use drugs.  Allergies:  Allergies  Allergen Reactions  . Beef-Derived Products Itching and Nausea And Vomiting  . Pork-Derived Products Itching and Nausea And Vomiting    Medications: Prior to Admission medications   Medication Sig Start Date End Date Taking? Authorizing Provider  lisinopril (PRINIVIL,ZESTRIL) 20 MG tablet Take 1 tablet (20 mg total) by mouth daily. 08/18/15  Yes Lendon Colonel, NP  meclizine (ANTIVERT) 25 MG tablet Take 1 tablet (25 mg total) by mouth 4 (four) times daily as needed for dizziness. 07/27/16  Yes Rolland Porter, MD  NITROSTAT 0.4 MG SL tablet DISSOLVE (1) TABLET UNDER TONGUE AS NEEDED TO RELIEVE CHEST PAIN. MAYREPEAT EVERY 5 MINUTES. 05/28/16  Yes Lendon Colonel, NP  ondansetron (ZOFRAN) 4 MG tablet Take 1 tablet (4 mg total) by mouth every 8 (eight) hours as needed for nausea or vomiting. 07/27/16  Yes Rolland Porter, MD  pantoprazole (PROTONIX) 40 MG tablet TAKE 1 TABLET BY MOUTH ONCE DAILY. 05/28/16  Yes Lendon Colonel, NP  Pediatric Multivitamins-Iron (FLINTSTONES PLUS IRON PO) Take 1 tablet by mouth daily.   Yes Historical Provider, MD  atorvastatin (LIPITOR) 10 MG tablet Take 1 tablet (10 mg total) by mouth at bedtime. FOR CHOLESTEROL Patient not taking: Reported on 08/01/2016 04/30/16   Alycia Rossetti, MD  ferrous sulfate 325 (65 FE) MG EC tablet Take 1 tablet (325 mg total)  by mouth 2 (two) times daily with a meal. Patient not taking: Reported on 08/01/2016 05/15/16   Alycia Rossetti, MD    Scheduled Meds: .  stroke: mapping our early stages of recovery book   Does not apply Once  . aspirin  81 mg Oral Daily  . atorvastatin  10 mg Oral QHS  . fondaparinux (ARIXTRA) injection  2.5 mg Subcutaneous Q0600  . pantoprazole  40 mg Oral Daily   Continuous Infusions: . sodium chloride     PRN Meds:.senna-docusate     Results for orders placed or performed during the hospital encounter of 08/01/16 (from the past 48 hour(s))  CBG monitoring, ED     Status: None   Collection Time: 08/01/16  4:37 PM  Result Value Ref Range   Glucose-Capillary 94 65 - 99 mg/dL  Urinalysis, Routine w reflex microscopic     Status: Abnormal   Collection Time: 08/01/16  5:18 PM  Result Value Ref Range   Color, Urine YELLOW YELLOW   APPearance CLEAR CLEAR   Specific Gravity, Urine 1.025 1.005 - 1.030   pH 6.0 5.0 - 8.0   Glucose, UA NEGATIVE  NEGATIVE mg/dL   Hgb urine dipstick TRACE (A) NEGATIVE   Bilirubin Urine NEGATIVE NEGATIVE   Ketones, ur NEGATIVE NEGATIVE mg/dL   Protein, ur 30 (A) NEGATIVE mg/dL   Nitrite NEGATIVE NEGATIVE   Leukocytes, UA MODERATE (A) NEGATIVE  Urine microscopic-add on     Status: Abnormal   Collection Time: 08/01/16  5:18 PM  Result Value Ref Range   Squamous Epithelial / LPF 0-5 (A) NONE SEEN   WBC, UA 6-30 0 - 5 WBC/hpf   RBC / HPF 0-5 0 - 5 RBC/hpf   Bacteria, UA FEW (A) NONE SEEN  Basic metabolic panel     Status: Abnormal   Collection Time: 08/01/16  5:26 PM  Result Value Ref Range   Sodium 135 135 - 145 mmol/L   Potassium 3.5 3.5 - 5.1 mmol/L   Chloride 107 101 - 111 mmol/L   CO2 24 22 - 32 mmol/L   Glucose, Bld 99 65 - 99 mg/dL   BUN 13 6 - 20 mg/dL   Creatinine, Ser 0.72 0.44 - 1.00 mg/dL   Calcium 8.1 (L) 8.9 - 10.3 mg/dL   GFR calc non Af Amer >60 >60 mL/min   GFR calc Af Amer >60 >60 mL/min    Comment: (NOTE) The eGFR has been  calculated using the CKD EPI equation. This calculation has not been validated in all clinical situations. eGFR's persistently <60 mL/min signify possible Chronic Kidney Disease.    Anion gap 4 (L) 5 - 15  CBC with Differential     Status: Abnormal   Collection Time: 08/01/16  5:26 PM  Result Value Ref Range   WBC 7.4 4.0 - 10.5 K/uL   RBC 4.38 3.87 - 5.11 MIL/uL   Hemoglobin 10.6 (L) 12.0 - 15.0 g/dL   HCT 32.2 (L) 36.0 - 46.0 %   MCV 73.5 (L) 78.0 - 100.0 fL   MCH 24.2 (L) 26.0 - 34.0 pg   MCHC 32.9 30.0 - 36.0 g/dL   RDW 23.6 (H) 11.5 - 15.5 %   Platelets 206 150 - 400 K/uL    Comment: SPECIMEN CHECKED FOR CLOTS PLATELET COUNT CONFIRMED BY SMEAR LARGE PLATELETS PRESENT GIANT PLATELETS SEEN    Neutrophils Relative % 75 %   Lymphocytes Relative 17 %   Monocytes Relative 6 %   Eosinophils Relative 0 %   Basophils Relative 2 %   Neutro Abs 5.6 1.7 - 7.7 K/uL   Lymphs Abs 1.3 0.7 - 4.0 K/uL   Monocytes Absolute 0.4 0.1 - 1.0 K/uL   Eosinophils Absolute 0.0 0.0 - 0.7 K/uL   Basophils Absolute 0.1 0.0 - 0.1 K/uL   WBC Morphology WHITE COUNT CONFIRMED ON SMEAR    Smear Review SCHISTOCYTES NOTED ON SMEAR     Comment: POLYCHROMASIA PRESENT  I-stat troponin, ED     Status: None   Collection Time: 08/01/16  5:47 PM  Result Value Ref Range   Troponin i, poc 0.00 0.00 - 0.08 ng/mL   Comment 3            Comment: Due to the release kinetics of cTnI, a negative result within the first hours of the onset of symptoms does not rule out myocardial infarction with certainty. If myocardial infarction is still suspected, repeat the test at appropriate intervals.   Lipid panel     Status: Abnormal   Collection Time: 08/02/16  6:30 AM  Result Value Ref Range   Cholesterol 274 (H) 0 - 200 mg/dL  Triglycerides 123 <150 mg/dL   HDL 36 (L) >40 mg/dL   Total CHOL/HDL Ratio 7.6 RATIO   VLDL 25 0 - 40 mg/dL   LDL Cholesterol 213 (H) 0 - 99 mg/dL    Comment:        Total  Cholesterol/HDL:CHD Risk Coronary Heart Disease Risk Table                     Men   Women  1/2 Average Risk   3.4   3.3  Average Risk       5.0   4.4  2 X Average Risk   9.6   7.1  3 X Average Risk  23.4   11.0        Use the calculated Patient Ratio above and the CHD Risk Table to determine the patient's CHD Risk.        ATP III CLASSIFICATION (LDL):  <100     mg/dL   Optimal  100-129  mg/dL   Near or Above                    Optimal  130-159  mg/dL   Borderline  160-189  mg/dL   High  >190     mg/dL   Very High     Studies/Results:  Vitamin B12 511 04/2016    BRAIN MRA FINDINGS: Both vertebral arteries patent to the basilar. PICA patent bilaterally. Mild irregularity distal basilar compatible with atherosclerotic disease without stenosis. Severe stenosis left posterior cerebral artery. Occlusion right posterior cerebral artery. Superior cerebellar arteries are patent bilaterally.  Moderate stenosis right supraclinoid internal carotid artery. Hypoplastic and diseased right A1 segment. Right A2 segment patent without stenosis. Right M1 segment patent with mild atherosclerotic disease. Moderate disease in right MCA branches similar to the prior study.  Mild stenosis left internal carotid artery. Mild stenosis proximal left A1 segment and proximal left A2 segment. Moderate disease left M1 segment and left middle cerebral artery branches similar to the prior study.  Negative for cerebral aneurysm.  IMPRESSION: Moderate intracranial atherosclerotic disease as above. Interval occlusion right posterior cerebral artery since 2011.    BRAIN MRI FINDINGS: Brain: There is focal diffusion restriction within the left cerebellar hemisphere, measuring approximately 7 mm. There is a smaller, punctate area of diffusion restriction more posteriorly in the left cerebellum. No supratentorial diffusion restriction. There is mild associated hyperintense T2 weighted signal  at the sites of diffusion restriction. There is confluent periventricular and subcortical hyperintense T2 weighted signal compatible with chronic microvascular ischemia. No mass lesion or midline shift. No hydrocephalus or extra-axial fluid collection.  Vascular: Major intracranial flow voids are preserved. No evidence of chronic microhemorrhage or amyloid angiopathy.  Skull and upper cervical spine: The visualized skull base, calvarium, upper cervical spine and extracranial soft tissues are normal.  Sinuses/Orbits: No fluid levels or advanced mucosal thickening. No mastoid effusion. Normal orbits.  IMPRESSION: Small acute infarcts of the left cerebellar hemisphere. No hemorrhage or mass effect.     The brain MRI is reviewed in person and shows a small infarct seen on 2 cuts with increased signal on diffusion imaging involving the inferior cerebellum on the left side. There is severe confluent white matter increased signal seen on FLAIR imaging consistent with chronic micro-ischemic changes.    CAROTID DOPPLER: Mild carotid atherosclerosis bilaterally. No hemodynamically significant ICA stenosis. Degree of narrowing less than 50% bilaterally.  Patent antegrade vertebral flow bilaterally  TTE pending  Javaeh Muscatello A. Merlene Baird, M.D.  Diplomate, Tax adviser of Psychiatry and Neurology ( Neurology). 08/02/2016, 1:54 PM

## 2016-08-03 LAB — HEMOGLOBIN A1C
Hgb A1c MFr Bld: 5.5 % (ref 4.8–5.6)
Mean Plasma Glucose: 111 mg/dL

## 2016-08-07 ENCOUNTER — Ambulatory Visit (HOSPITAL_COMMUNITY): Payer: Medicare Other

## 2016-08-14 ENCOUNTER — Telehealth: Payer: Self-pay | Admitting: Family Medicine

## 2016-08-14 ENCOUNTER — Encounter: Payer: Self-pay | Admitting: Family Medicine

## 2016-08-14 NOTE — Telephone Encounter (Signed)
[facility] calling to discuss a glucose meter with a nurse  [phone]

## 2016-08-15 NOTE — Telephone Encounter (Signed)
Call placed to [facility]. [place].

## 2016-08-16 NOTE — Telephone Encounter (Signed)
Received call from [place] Nurse.   Reports that patient states she is diabetic and has had Dx x1 year. Reports that patient does not have a meter to check FSBS.   Advised that no Dx noted on chart for DM. Last A1C 2 weeks prior noted at 5.5%. Patient is not on medications for DM. Advised that patient will not require FSBS testing at this time.   Also reports that patient is supposed to have f/U CBC and BMET. Requested order to draw labs on 08/20/2016. VO given.   Advised nurse to have patient schedule hospital F/U with PCP.

## 2016-08-20 ENCOUNTER — Other Ambulatory Visit (HOSPITAL_COMMUNITY)
Admission: RE | Admit: 2016-08-20 | Discharge: 2016-08-20 | Disposition: A | Discharge status: Home / Self Care | Payer: Medicare Other | Source: Other Acute Inpatient Hospital | Attending: Family Medicine | Admitting: Family Medicine

## 2016-08-20 DIAGNOSIS — M6281 Muscle weakness (generalized): Secondary | ICD-10-CM | POA: Insufficient documentation

## 2016-08-20 LAB — CBC WITH DIFFERENTIAL/PLATELET
BASOS ABS: 0 10*3/uL (ref 0.0–0.1)
BASOS PCT: 0 %
BLASTS: 0 %
Band Neutrophils: 0 %
Eosinophils Absolute: 0 10*3/uL (ref 0.0–0.7)
Eosinophils Relative: 0 %
HEMATOCRIT: 34.1 % — AB (ref 36.0–46.0)
HEMOGLOBIN: 11.3 g/dL — AB (ref 12.0–15.0)
LYMPHS ABS: 1.5 10*3/uL (ref 0.7–4.0)
Lymphocytes Relative: 25 %
MCH: 24.7 pg — AB (ref 26.0–34.0)
MCHC: 33.1 g/dL (ref 30.0–36.0)
MCV: 74.5 fL — ABNORMAL LOW (ref 78.0–100.0)
MONO ABS: 0.5 10*3/uL (ref 0.1–1.0)
MYELOCYTES: 0 %
Metamyelocytes Relative: 0 %
Monocytes Relative: 8 %
NEUTROS PCT: 67 %
NRBC: 0 /100{WBCs}
Neutro Abs: 3.8 10*3/uL (ref 1.7–7.7)
PROMYELOCYTES ABS: 0 %
Platelets: 186 10*3/uL (ref 150–400)
RBC: 4.58 MIL/uL (ref 3.87–5.11)
RDW: 23.5 % — ABNORMAL HIGH (ref 11.5–15.5)
WBC: 5.8 10*3/uL (ref 4.0–10.5)

## 2016-08-20 LAB — BASIC METABOLIC PANEL
ANION GAP: 8 (ref 5–15)
BUN: 11 mg/dL (ref 6–20)
CALCIUM: 8.7 mg/dL — AB (ref 8.9–10.3)
CO2: 25 mmol/L (ref 22–32)
CREATININE: 0.8 mg/dL (ref 0.44–1.00)
Chloride: 106 mmol/L (ref 101–111)
Glucose, Bld: 93 mg/dL (ref 65–99)
Potassium: 4 mmol/L (ref 3.5–5.1)
SODIUM: 139 mmol/L (ref 135–145)

## 2016-09-02 ENCOUNTER — Other Ambulatory Visit: Payer: Self-pay | Admitting: Adult Health

## 2016-09-16 ENCOUNTER — Ambulatory Visit (INDEPENDENT_AMBULATORY_CARE_PROVIDER_SITE_OTHER): Payer: Medicare Other | Admitting: Family Medicine

## 2016-09-16 ENCOUNTER — Encounter: Payer: Self-pay | Admitting: Family Medicine

## 2016-09-16 VITALS — BP 100/60 | HR 72 | Temp 98.5°F | Resp 18 | Wt 128.0 lb

## 2016-09-16 DIAGNOSIS — H1032 Unspecified acute conjunctivitis, left eye: Secondary | ICD-10-CM

## 2016-09-16 DIAGNOSIS — Z8673 Personal history of transient ischemic attack (TIA), and cerebral infarction without residual deficits: Secondary | ICD-10-CM | POA: Diagnosis not present

## 2016-09-16 DIAGNOSIS — I952 Hypotension due to drugs: Secondary | ICD-10-CM | POA: Diagnosis not present

## 2016-09-16 DIAGNOSIS — C801 Malignant (primary) neoplasm, unspecified: Secondary | ICD-10-CM

## 2016-09-16 DIAGNOSIS — I959 Hypotension, unspecified: Secondary | ICD-10-CM | POA: Insufficient documentation

## 2016-09-16 MED ORDER — LISINOPRIL 10 MG PO TABS: 10.0000 mg | ORAL_TABLET | Freq: Every day | ORAL | 6 refills | Status: DC

## 2016-09-16 MED ORDER — ATORVASTATIN CALCIUM 20 MG PO TABS: 20.0000 mg | ORAL_TABLET | Freq: Every day | ORAL | 6 refills | Status: AC

## 2016-09-16 MED ORDER — PANTOPRAZOLE SODIUM 40 MG PO TBEC: 40.0000 mg | DELAYED_RELEASE_TABLET | Freq: Every day | ORAL | 6 refills | Status: AC

## 2016-09-16 MED ORDER — POLYMYXIN B-TRIMETHOPRIM 10000-0.1 UNIT/ML-% OP SOLN: 1.0000 [drp] | Freq: Four times a day (QID) | OPHTHALMIC | 0 refills | Status: DC

## 2016-09-16 NOTE — Assessment & Plan Note (Addendum)
Very low normotensive blood pressure noted decrease her lisinopril to 10 mg once a day

## 2016-09-16 NOTE — Assessment & Plan Note (Signed)
Recent cerebellar stroke no significant residual effects. She is still quite independent at home

## 2016-09-16 NOTE — Patient Instructions (Addendum)
Blood pressure medication decreased to 10mg  once a day  Take the iron tablets Take the protonix for your stomach CT scan to get picture of your bowels from the colon cancer  Eye drop sent to pharmacy F/U 2 months

## 2016-09-16 NOTE — Progress Notes (Signed)
Subjective:    Patient ID: Evelyn Baird, female    DOB: 1930/07/05, 82 y.o.   MRN: [id number]  Patient presents for Hospitalization Follow-up Patient here for hospital follow-up. She was admitted back in September after episode of dizziness. She had been in the ER the week before was diagnosed with vertigo. MRI of the brain showed cerebral stroke and she was admitted for further workup. She was restarted on aspirin and statin therapy and recommended home health physical therapy. She had echocardiogram done which showed preservation of her ejection fraction she did have some mild diastolic dysfunction and calcifications on the aortic valve but no stenosis reported  Home health nurse did draw her metabolic panel in her CBC which came back stable She had A1c done in the hospital which was normal at 5.5% She does have underlying history of coronary artery disease followed by cardiology which is why she was on aspirin. Since our establishing visit back in June I referred her to GI secondary to acute blood loss anemia-  Colonsocopy shows:  A polypoid and ulcerated non-obstructing large circumferential mass was found in the proximal transverse colon. The mass was circumferential. The mass measured two cm in length. No bleeding was present. Biopsies were taken with a cold forceps for histology. Pathology showed invasive adenocarcinoma He was asked to suppose to have CT of abdomen and pelvis performed but she went to the hospital with a stroke shortly thereafter    Findings: A small polyp was found in the sigmoid colon. The polyp was sessile. The polyp was removed with a cold snare. Resection and retrieval were complete.  Overall she states she has been doing well. He is here today with her daughter. She states that she did have some pains in her hands after doing physical therapy she started taking some ibuprofen that stopped walking again She has not been using the compression hose for her leg  swelling which is chronic  No medications presents today  On exam she was noted to have some erythema for conjunctiva on the left side as well as some mild swelling of her left upper leg she states it's been present for the past few days she's had drainage and crusting in the mornings, no change in vision   Review Of Systems:  GEN- denies fatigue, fever, weight loss,weakness, recent illness HEENT- +eye drainage, Denieschange in vision, nasal discharge, CVS- denies chest pain, palpitations RESP- denies SOB, cough, wheeze ABD- denies N/V, change in stools, abd pain GU- denies dysuria, hematuria, dribbling, incontinence MSK- + joint pain, muscle aches, injury Neuro- denies headache, dizziness, syncope, seizure activity       Objective:    BP 100/60 (BP Location: Right Arm, Patient Position: Sitting, Cuff Size: Normal)   Pulse 72   Temp 98.5 F (36.9 C) (Oral)   Resp 18   Wt 128 lb (58.1 kg)   BMI 21.30 kg/m  GEN- NAD, alert and oriented x3, no walking assistive device  HEENT- PERRL, EOMI, non injected sclera, pink conjunctiva, MMM, oropharynx clear Neck- Supple, no thyromegaly CVS- RRR, no murmur RESP-RRR, 2/6 SEM RSB ABD-NABS,soft,NT,ND EXT-+ non pitting edema bilat       Assessment & Plan:      Problem List Items Addressed This Visit    Hypotension    Very low normotensive blood pressure noted decrease her lisinopril to 10 mg once a day      Relevant Medications   lisinopril (PRINIVIL,ZESTRIL) 10 MG tablet   atorvastatin (LIPITOR) 20  MG tablet   History of cerebellar stroke    Recent cerebellar stroke no significant residual effects. 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 She does not want any surgical intervention     Anemia on the setting of polyp in  adenocarcinoma advised her not to take any ibuprofen she is to use Tylenol for any pain     Note: This dictation was prepared with Dragon dictation along with smaller phrase technology. Any transcriptional errors that result from this process are unintentional.

## 2016-09-27 ENCOUNTER — Ambulatory Visit (HOSPITAL_COMMUNITY): Payer: Medicare Other

## 2016-10-13 ENCOUNTER — Emergency Department (HOSPITAL_COMMUNITY): Payer: Medicare Other

## 2016-10-13 ENCOUNTER — Encounter (HOSPITAL_COMMUNITY): Payer: Self-pay | Admitting: Emergency Medicine

## 2016-10-13 ENCOUNTER — Emergency Department (HOSPITAL_COMMUNITY)
Admission: EM | Admit: 2016-10-13 | Discharge: 2016-10-13 | Disposition: A | Discharge status: Home / Self Care | Payer: Medicare Other | Attending: Emergency Medicine | Admitting: Emergency Medicine

## 2016-10-13 DIAGNOSIS — Z7982 Long term (current) use of aspirin: Secondary | ICD-10-CM | POA: Insufficient documentation

## 2016-10-13 DIAGNOSIS — Z79899 Other long term (current) drug therapy: Secondary | ICD-10-CM | POA: Insufficient documentation

## 2016-10-13 DIAGNOSIS — C189 Malignant neoplasm of colon, unspecified: Secondary | ICD-10-CM | POA: Diagnosis not present

## 2016-10-13 DIAGNOSIS — R0602 Shortness of breath: Secondary | ICD-10-CM | POA: Insufficient documentation

## 2016-10-13 DIAGNOSIS — I2510 Atherosclerotic heart disease of native coronary artery without angina pectoris: Secondary | ICD-10-CM | POA: Insufficient documentation

## 2016-10-13 DIAGNOSIS — I10 Essential (primary) hypertension: Secondary | ICD-10-CM | POA: Diagnosis not present

## 2016-10-13 DIAGNOSIS — R05 Cough: Secondary | ICD-10-CM | POA: Insufficient documentation

## 2016-10-13 DIAGNOSIS — E119 Type 2 diabetes mellitus without complications: Secondary | ICD-10-CM | POA: Insufficient documentation

## 2016-10-13 DIAGNOSIS — R079 Chest pain, unspecified: Secondary | ICD-10-CM | POA: Diagnosis present

## 2016-10-13 LAB — CBC
HCT: 31.1 % — ABNORMAL LOW (ref 36.0–46.0)
HEMOGLOBIN: 10.4 g/dL — AB (ref 12.0–15.0)
MCH: 26.4 pg (ref 26.0–34.0)
MCHC: 33.4 g/dL (ref 30.0–36.0)
MCV: 78.9 fL (ref 78.0–100.0)
Platelets: 176 10*3/uL (ref 150–400)
RBC: 3.94 MIL/uL (ref 3.87–5.11)
RDW: 17.3 % — ABNORMAL HIGH (ref 11.5–15.5)
WBC: 4.1 10*3/uL (ref 4.0–10.5)

## 2016-10-13 LAB — URINALYSIS, ROUTINE W REFLEX MICROSCOPIC
BILIRUBIN URINE: NEGATIVE
Glucose, UA: NEGATIVE mg/dL
Hgb urine dipstick: NEGATIVE
KETONES UR: NEGATIVE mg/dL
Leukocytes, UA: NEGATIVE
NITRITE: NEGATIVE
PROTEIN: NEGATIVE mg/dL
pH: 6 (ref 5.0–8.0)

## 2016-10-13 LAB — I-STAT TROPONIN, ED: TROPONIN I, POC: 0 ng/mL (ref 0.00–0.08)

## 2016-10-13 LAB — BASIC METABOLIC PANEL
ANION GAP: 9 (ref 5–15)
BUN: 14 mg/dL (ref 6–20)
CALCIUM: 9.3 mg/dL (ref 8.9–10.3)
CHLORIDE: 103 mmol/L (ref 101–111)
CO2: 24 mmol/L (ref 22–32)
Creatinine, Ser: 0.99 mg/dL (ref 0.44–1.00)
GFR calc non Af Amer: 51 mL/min — ABNORMAL LOW (ref >60)
GFR, EST AFRICAN AMERICAN: 59 mL/min — AB (ref >60)
Glucose, Bld: 121 mg/dL — ABNORMAL HIGH (ref 65–99)
Potassium: 4.1 mmol/L (ref 3.5–5.1)
Sodium: 136 mmol/L (ref 135–145)

## 2016-10-13 LAB — D-DIMER, QUANTITATIVE (NOT AT ARMC): D DIMER QUANT: 11.55 ug{FEU}/mL — AB (ref 0.00–0.50)

## 2016-10-13 MED ORDER — SODIUM CHLORIDE 0.9 % IV BOLUS (SEPSIS)
500.0000 mL | Freq: Once | INTRAVENOUS | Status: AC
  Administered 2016-10-13: 500 mL via INTRAVENOUS

## 2016-10-13 MED ORDER — LACTATED RINGERS IV BOLUS (SEPSIS)
1000.0000 mL | Freq: Once | INTRAVENOUS | Status: AC
  Administered 2016-10-13: 1000 mL via INTRAVENOUS

## 2016-10-13 MED ORDER — IOPAMIDOL (ISOVUE-370) INJECTION 76%
100.0000 mL | Freq: Once | INTRAVENOUS | Status: AC | PRN
  Administered 2016-10-13: 100 mL via INTRAVENOUS

## 2016-10-13 NOTE — ED Triage Notes (Signed)
Patient c/o right side chest pain with shortness of breath and generalized weakness. Patient reports symptoms x1 week. Per daughter [facility] and diagnosed with a "bladder infection" and placed on antibiotics. Patient states "Since then I feel like I'm getting weaker." Denies any diarrhea, vomiting, cough, or fever. Patient states pain is worse with deep breath/ Patient also reports intermittent dizziness.

## 2016-10-13 NOTE — ED Notes (Signed)
Pt and son state that they would like to speak with physician regarding her hand cramps- Physician in to speak with

## 2016-10-13 NOTE — Discharge Instructions (Signed)
PLEASE CALL DR. Buelah Manis AND DR. Whitney Muse FOR A QUICK APPOINTMENT.

## 2016-10-13 NOTE — ED Notes (Addendum)
Ambulated Pt spo2 was 98 & pulse was 79

## 2016-10-13 NOTE — ED Provider Notes (Signed)
[place] DEPT Provider Note   CSN: [id number] Arrival date & time: 10/13/16  1157   By signing my name below, I, Delton Prairie, attest that this documentation has been prepared under the direction and in the presence of Varney Biles, MD  Electronically Signed: Delton Prairie, ED Scribe. 10/13/16. 1:02 PM.   History   Chief Complaint Chief Complaint  Patient presents with  . Chest Pain    The history is provided by the patient. No language interpreter was used.   HPI Comments:  MAUDELLE CAPOTE is a 82 y.o. female, with a hx of HTN, who presents to the Emergency Department complaining of sudden onset, moderate chest pain, located beneath her left breast, which began in the AM today. Pt states her pain is worse with deep breathing. She also endorses a productive cough, weakness and worsening SOB upon exertion. Pt denies fevers, chills, any known heart issues, any known lung issues, getting her flu shot this year, any other associated symptoms and modifying factors at this time.    Past Medical History:  Diagnosis Date  . Anemia   . Coronary atherosclerosis of native coronary artery    Nonobstructive 2009 - previously followed by [facility]  . Diabetes mellitus without complication ([place])   . Essential hypertension, benign   . GERD (gastroesophageal reflux disease)   . Hyperlipidemia   . Hypertension   . Peripheral neuropathy ([place])   . Prediabetes   . PVC's (premature ventricular contractions)     Patient Active Problem List   Diagnosis Date Noted  . Hypotension 09/16/2016  . CVA (cerebral infarction) 08/01/2016  . History of cerebellar stroke 08/01/2016  . IDA (iron deficiency anemia) 06/18/2016  . Guaiac + stool 06/18/2016  . Peripheral edema 04/30/2016  . Esophageal dysmotility 06/20/2015  . Dysphagia 05/09/2015  . Anemia 03/23/2014  . Hereditary and idiopathic peripheral neuropathy 12/06/2013  . Aortic valve disorders 04/03/2012  . Hyperlipidemia 10/15/2006  .  Essential hypertension, benign 10/15/2006  . Coronary atherosclerosis of native coronary artery 10/15/2006  . GERD 10/15/2006    Past Surgical History:  Procedure Laterality Date  . BIOPSY  07/19/2016   Procedure: BIOPSY;  Surgeon: Rogene Houston, MD;  Location[facility];  Service: Endoscopy;;  colon  . COLONOSCOPY N/A 07/19/2016   Procedure: COLONOSCOPY;  Surgeon: Rogene Houston, MD;  Location: [facility];  Service: Endoscopy;  Laterality: N/A;  12:25  . HERNIA REPAIR    . Left abdominal hernia repair    . POLYPECTOMY  07/19/2016   Procedure: POLYPECTOMY;  Surgeon: Rogene Houston, MD;  Location: [facility];  Service: Endoscopy;;  colon    OB History    Gravida Para Term Preterm AB Living   7 7 7     6    SAB TAB Ectopic Multiple Live Births                   Home Medications    Prior to Admission medications   Medication Sig Start Date End Date Taking? Authorizing Provider  aspirin 81 MG chewable tablet Chew 1 tablet (81 mg total) by mouth daily. 08/03/16  Yes Kathie Dike, MD  ciprofloxacin (CIPRO) 250 MG tablet Take 1 tablet by mouth every 12 (twelve) hours. 10/07/16  Yes Historical Provider, MD  lisinopril (PRINIVIL,ZESTRIL) 10 MG tablet Take 1 tablet (10 mg total) by mouth daily. 09/16/16  Yes Alycia Rossetti, MD  meclizine (ANTIVERT) 25 MG tablet Take 1 tablet (25 mg total) by  mouth 4 (four) times daily as needed for dizziness. 07/27/16  Yes Rolland Porter, MD  NITROSTAT 0.4 MG SL tablet DISSOLVE (1) TABLET UNDER TONGUE AS NEEDED TO RELIEVE CHEST PAIN. MAYREPEAT EVERY 5 MINUTES. 05/28/16  Yes Lendon Colonel, NP  pantoprazole (PROTONIX) 40 MG tablet Take 1 tablet (40 mg total) by mouth daily. 09/16/16  Yes Alycia Rossetti, MD  Pediatric Multivitamins-Iron (FLINTSTONES PLUS IRON PO) Take 1 tablet by mouth daily.   Yes Historical Provider, MD  atorvastatin (LIPITOR) 20 MG tablet Take 1 tablet (20 mg total) by mouth at bedtime. FOR CHOLESTEROL Patient not taking: Reported  on 10/13/2016 09/16/16   Alycia Rossetti, MD  trimethoprim-polymyxin b [facility]) ophthalmic solution Place 1 drop into the left eye every 6 (six) hours. For 5 days 09/16/16   Alycia Rossetti, MD    Family History Family History  Problem Relation Age of Onset  . Hypertension Father   . Kidney disease Father     Social History Social History  Substance Use Topics  . Smoking status: Never Smoker  . Smokeless tobacco: Never Used     Comment: Never smoked  . Alcohol use No     Allergies   Beef-derived products and Pork-derived products   Review of Systems Review of Systems  Constitutional: Negative for chills and fever.  Respiratory: Positive for cough and shortness of breath.   Cardiovascular: Positive for chest pain.  Musculoskeletal: Positive for myalgias.  Neurological: Positive for weakness.  10 Systems reviewed and are negative for acute change except as noted in the HPI.  Physical Exam Updated Vital Signs BP 119/78 (BP Location: Right Arm)   Pulse 88   Temp 99.3 F (37.4 C) (Oral)   Resp 16   Ht 5\' 4"  (1.626 m)   Wt 124 lb (56.2 kg)   SpO2 100%   BMI 21.28 kg/m   Physical Exam  Constitutional: She is oriented to person, place, and time. She appears well-developed and well-nourished. No distress.  HENT:  Head: Normocephalic and atraumatic.  Eyes: Conjunctivae are normal.  Neck: No JVD present.  Cardiovascular: Normal rate and regular rhythm.   No murmur heard. Pulses:      Radial pulses are 2+ on the right side, and 2+ on the left side.  Pulmonary/Chest: Effort normal.  Bibasilar rales no rhonchi or wheezing appreciated.   Abdominal: Soft. She exhibits no distension and no mass. There is tenderness. There is no rebound and no guarding.  Neurological: She is alert and oriented to person, place, and time.  Skin: Skin is warm and dry.  Psychiatric: She has a normal mood and affect.  Nursing note and vitals reviewed.    ED Treatments / Results    DIAGNOSTIC STUDIES:  Oxygen Saturation is 100% on RA, normal by my interpretation.    COORDINATION OF CARE:  12:45 PM Discussed treatment plan with pt at bedside and pt agreed to plan.  Labs (all labs ordered are listed, but only abnormal results are displayed) Labs Reviewed  BASIC METABOLIC PANEL - Abnormal; Notable for the following:       Result Value   Glucose, Bld 121 (*)    GFR calc non Af Amer 51 (*)    GFR calc Af Amer 59 (*)    All other components within normal limits  CBC - Abnormal; Notable for the following:    Hemoglobin 10.4 (*)    HCT 31.1 (*)    RDW 17.3 (*)    All  other components within normal limits  D-DIMER, QUANTITATIVE (NOT AT [facility]) - Abnormal; Notable for the following:    D-Dimer, Quant 11.55 (*)    All other components within normal limits  URINALYSIS, ROUTINE W REFLEX MICROSCOPIC (NOT AT [facility]) - Abnormal; Notable for the following:    Specific Gravity, Urine <1.005 (*)    All other components within normal limits  I-STAT TROPOININ, ED    EKG  EKG Interpretation  Date/Time:  Sunday October 13 2016 12:09:59 EST Ventricular Rate:  82 PR Interval:    QRS Duration: 95 QT Interval:  381 QTC Calculation: 445 R Axis:   -45 Text Interpretation:  Sinus rhythm Left anterior fascicular block No acute changes No significant change since last tracing Confirmed by Kathrynn Humble, MD, Thelma Comp [phone]) on 10/13/2016 1:00:29 PM       Radiology Dg Chest 2 View  Result Date: 10/13/2016 CLINICAL DATA:  Chest pain and shortness of breath since this morning. Back pain. EXAM: CHEST  2 VIEW COMPARISON:  08/01/2016. FINDINGS: Stable enlarged cardiac silhouette and tortuous and partially calcified thoracic aorta. Stable calcified granulomata in the left upper lobe. The interstitial markings remain mildly prominent. Thoracic spine degenerative changes. Diffuse osteopenia. Mild bilateral shoulder degenerative changes. IMPRESSION: 1. No acute abnormality. 2. Stable mild  cardiomegaly and mild chronic interstitial lung disease. 3. Mild aortic atherosclerosis. Electronically Signed   By: Claudie Revering M.D.   On: 10/13/2016 13:20   Ct Angio Chest Pe W And/or Wo Contrast  Result Date: 10/13/2016 CLINICAL DATA:  Right-sided chest pain for 1 week. Elevated D-dimer. Newly diagnosed colon adenocarcinoma. EXAM: CT ANGIOGRAPHY CHEST CT ABDOMEN AND PELVIS WITH CONTRAST TECHNIQUE: Multidetector CT imaging of the chest was performed using the standard protocol during bolus administration of intravenous contrast. Multiplanar CT image reconstructions and MIPs were obtained to evaluate the vascular anatomy. Multidetector CT imaging of the abdomen and pelvis was performed using the standard protocol during bolus administration of intravenous contrast. CONTRAST:  100 mL Isovue 370 COMPARISON:  Chest CTA on 08/04/2015 and noncontrast AP CT on 05/12/2016 FINDINGS: CTA CHEST FINDINGS Cardiovascular: Satisfactory opacification of pulmonary arteries noted, and no pulmonary emboli identified. No evidence of thoracic aortic dissection or aneurysm. Mild cardiomegaly. Aortic atherosclerosis. Coronary artery calcification. Mediastinum/Nodes: Increase mediastinal lymphadenopathy in the subcarinal region measuring 1.6 cm on image 45/5 compared to 1.0 cm previously. No other mediastinal or hilar lymphadenopathy identified. Increased bilateral axillary and subpectoral lymphadenopathy since prior exam, with largest index lymph node in the left axilla measuring 3.3 cm short axis on image 30/5 compared to 2.0 cm previously. Index lymph node in the right axilla measures 1.2 cm on image 22/5 and is new since prior study. Lungs/Pleura: Mild bilateral lower lobe scarring again seen. No evidence of pulmonary infiltrate or pleural effusion. Multiple small calcified granulomas are seen within the left lung, however no suspicious pulmonary nodules or masses are identified. Musculoskeletal: No suspicious bone lesions or  other significant abnormality identified. Review of the MIP images confirms the above findings. CT ABDOMEN and PELVIS FINDINGS Hepatobiliary: Multiple small hypovascular masses are seen within the right hepatic lobe, measuring up to 1.2 cm. Small liver metastases cannot be excluded. Gallbladder is unremarkable. Pancreas:  No mass or inflammatory changes. Spleen: Within normal limits in size and appearance. Adrenals/Urinary Tract: No masses identified. Tiny renal cysts noted bilaterally. No evidence of hydronephrosis. Unremarkable urinary bladder. Stomach/Bowel: Short segment circumferential wall thickening and enhancement seen involving the transverse colon, consistent with recently diagnosed colon carcinoma. No evidence  of bowel obstruction. Vascular/Lymphatic: 1.2 cm pericolonic lymph node is seen in the right anterior abdomen just superior to the transverse colon on image 28/13, suspicious for local lymph node metastasis. Other small sub-cm mesenteric and retroperitoneal lymph nodes are seen which are not pathologically enlarged. No abdominal aortic aneurysm.  Aortic atherosclerosis. Reproductive: Previous hysterectomy. Soft tissue stranding seen within the pelvic mesenteric fat. Ill-defined soft tissue density is seen in the presacral soft tissues and along the left pelvic sidewall. This is difficult to measure due to its ill-defined nature, but is approximately 2.5 x 5.5 cm on image 50/13. This is suspicious for lymphoma or metastatic disease. No evidence of ascites. Other:  None. Musculoskeletal:  No suspicious bone lesions identified. IMPRESSION: No evidence of pulmonary embolism.  No active lung disease. Short segment circumferential wall thickening involving the transverse colon, consistent with recently diagnosed colon carcinoma. 1.2 cm pericolonic lymph node, suspicious for local lymph node metastasis. Several small low-attenuation masses in right hepatic lobe measuring up to 1.2 cm. These are suspicious  for liver metastases. Abdomen MRI without and with contrast recommended for further characterization. Increased bilateral axillary, subpectoral, and mediastinal lymphadenopathy. This is atypical for metastatic colon carcinoma, and lymphoma cannot be excluded. Consider percutaneous needle biopsy of left axillary lymphadenopathy for tissue diagnosis. Ill-defined abnormal soft tissue density in the presacral region and along the left pelvic sidewall. Differential diagnosis includes lymphoma and metastatic disease. Electronically Signed   By: Earle Gell M.D.   On: 10/13/2016 18:01   Ct Abdomen Pelvis W Contrast  Result Date: 10/13/2016 CLINICAL DATA:  Right-sided chest pain for 1 week. Elevated D-dimer. Newly diagnosed colon adenocarcinoma. EXAM: CT ANGIOGRAPHY CHEST CT ABDOMEN AND PELVIS WITH CONTRAST TECHNIQUE: Multidetector CT imaging of the chest was performed using the standard protocol during bolus administration of intravenous contrast. Multiplanar CT image reconstructions and MIPs were obtained to evaluate the vascular anatomy. Multidetector CT imaging of the abdomen and pelvis was performed using the standard protocol during bolus administration of intravenous contrast. CONTRAST:  100 mL Isovue 370 COMPARISON:  Chest CTA on 08/04/2015 and noncontrast AP CT on 05/12/2016 FINDINGS: CTA CHEST FINDINGS Cardiovascular: Satisfactory opacification of pulmonary arteries noted, and no pulmonary emboli identified. No evidence of thoracic aortic dissection or aneurysm. Mild cardiomegaly. Aortic atherosclerosis. Coronary artery calcification. Mediastinum/Nodes: Increase mediastinal lymphadenopathy in the subcarinal region measuring 1.6 cm on image 45/5 compared to 1.0 cm previously. No other mediastinal or hilar lymphadenopathy identified. Increased bilateral axillary and subpectoral lymphadenopathy since prior exam, with largest index lymph node in the left axilla measuring 3.3 cm short axis on image 30/5 compared to  2.0 cm previously. Index lymph node in the right axilla measures 1.2 cm on image 22/5 and is new since prior study. Lungs/Pleura: Mild bilateral lower lobe scarring again seen. No evidence of pulmonary infiltrate or pleural effusion. Multiple small calcified granulomas are seen within the left lung, however no suspicious pulmonary nodules or masses are identified. Musculoskeletal: No suspicious bone lesions or other significant abnormality identified. Review of the MIP images confirms the above findings. CT ABDOMEN and PELVIS FINDINGS Hepatobiliary: Multiple small hypovascular masses are seen within the right hepatic lobe, measuring up to 1.2 cm. Small liver metastases cannot be excluded. Gallbladder is unremarkable. Pancreas:  No mass or inflammatory changes. Spleen: Within normal limits in size and appearance. Adrenals/Urinary Tract: No masses identified. Tiny renal cysts noted bilaterally. No evidence of hydronephrosis. Unremarkable urinary bladder. Stomach/Bowel: Short segment circumferential wall thickening and enhancement seen involving the transverse  colon, consistent with recently diagnosed colon carcinoma. No evidence of bowel obstruction. Vascular/Lymphatic: 1.2 cm pericolonic lymph node is seen in the right anterior abdomen just superior to the transverse colon on image 28/13, suspicious for local lymph node metastasis. Other small sub-cm mesenteric and retroperitoneal lymph nodes are seen which are not pathologically enlarged. No abdominal aortic aneurysm.  Aortic atherosclerosis. Reproductive: Previous hysterectomy. Soft tissue stranding seen within the pelvic mesenteric fat. Ill-defined soft tissue density is seen in the presacral soft tissues and along the left pelvic sidewall. This is difficult to measure due to its ill-defined nature, but is approximately 2.5 x 5.5 cm on image 50/13. This is suspicious for lymphoma or metastatic disease. No evidence of ascites. Other:  None. Musculoskeletal:  No  suspicious bone lesions identified. IMPRESSION: No evidence of pulmonary embolism.  No active lung disease. Short segment circumferential wall thickening involving the transverse colon, consistent with recently diagnosed colon carcinoma. 1.2 cm pericolonic lymph node, suspicious for local lymph node metastasis. Several small low-attenuation masses in right hepatic lobe measuring up to 1.2 cm. These are suspicious for liver metastases. Abdomen MRI without and with contrast recommended for further characterization. Increased bilateral axillary, subpectoral, and mediastinal lymphadenopathy. This is atypical for metastatic colon carcinoma, and lymphoma cannot be excluded. Consider percutaneous needle biopsy of left axillary lymphadenopathy for tissue diagnosis. Ill-defined abnormal soft tissue density in the presacral region and along the left pelvic sidewall. Differential diagnosis includes lymphoma and metastatic disease. Electronically Signed   By: Earle Gell M.D.   On: 10/13/2016 18:01    Procedures Procedures (including critical care time)  Medications Ordered in ED Medications  lactated ringers bolus 1,000 mL (1,000 mLs Intravenous New Bag/Given 10/13/16 1736)  iopamidol (ISOVUE-370) 76 % injection 100 mL (100 mLs Intravenous Contrast Given 10/13/16 1639)  sodium chloride 0.9 % bolus 500 mL (0 mLs Intravenous Stopped 10/13/16 1735)     Initial Impression / Assessment and Plan / ED Course  I have reviewed the triage vital signs and the nursing notes.  Pertinent labs & imaging results that were available during my care of the patient were reviewed by me and considered in my medical decision making (see chart for details).  Clinical Course    Pt comes in with cc of chest pain. She has focal R posterior chest pain and R sided abd pain. CXR is clear - so dimer ordered and it is +. Pt's chart review shows recent colonoscopy, and pt was noted to have colon CA. Pt doesn't volunteer that info, and she  might be in state of denial. We will get CT PE and CT abd.  8:06 PM Results from the ER workup discussed with the patient face to face and all questions answered to the best of my ability. Pt and family are at different place as far as what workup needs to be done. In this setting, it will be best for patient to get outpatient f/u with Oncology and PCP to get disposition.  Final Clinical Impressions(s) / ED Diagnoses   Final diagnoses:  Malignant neoplasm of colon, unspecified part of colon [facility])    New Prescriptions New Prescriptions   No medications on file     Varney Biles, MD 10/13/16 2006

## 2016-10-13 NOTE — ED Notes (Signed)
Family states patient recently "tested positive for colon cancer" during a colonoscopy and was to come back for a procedure 3 weeks ago but refused. Family states patient gets highly upset when it is mentioned and that she is in denial. EDP made aware.

## 2016-10-13 NOTE — ED Notes (Signed)
Pt's daughter states that pt gets really upset, recent dx of colon CA and pt had refused to come back for additional tests.  Daughter stated that pt is aware of dx of colon CA.

## 2016-10-17 ENCOUNTER — Telehealth: Payer: Self-pay | Admitting: *Deleted

## 2016-10-17 ENCOUNTER — Encounter (HOSPITAL_COMMUNITY): Payer: Self-pay | Admitting: Oncology

## 2016-10-17 ENCOUNTER — Ambulatory Visit (HOSPITAL_COMMUNITY)
Admission: RE | Admit: 2016-10-17 | Discharge: 2016-10-17 | Disposition: A | Discharge status: Home / Self Care | Payer: Medicare Other | Source: Ambulatory Visit | Attending: Oncology | Admitting: Oncology

## 2016-10-17 ENCOUNTER — Encounter (HOSPITAL_COMMUNITY): Payer: Medicare Other

## 2016-10-17 ENCOUNTER — Encounter (HOSPITAL_COMMUNITY): Payer: Medicare Other | Attending: Oncology | Admitting: Oncology

## 2016-10-17 VITALS — BP 88/64 | HR 85 | Temp 99.4°F | Resp 16 | Wt 124.0 lb

## 2016-10-17 DIAGNOSIS — C184 Malignant neoplasm of transverse colon: Secondary | ICD-10-CM | POA: Diagnosis not present

## 2016-10-17 DIAGNOSIS — Z9889 Other specified postprocedural states: Secondary | ICD-10-CM | POA: Diagnosis not present

## 2016-10-17 DIAGNOSIS — R935 Abnormal findings on diagnostic imaging of other abdominal regions, including retroperitoneum: Secondary | ICD-10-CM | POA: Diagnosis not present

## 2016-10-17 DIAGNOSIS — R609 Edema, unspecified: Secondary | ICD-10-CM | POA: Diagnosis present

## 2016-10-17 DIAGNOSIS — R599 Enlarged lymph nodes, unspecified: Secondary | ICD-10-CM

## 2016-10-17 DIAGNOSIS — Z8673 Personal history of transient ischemic attack (TIA), and cerebral infarction without residual deficits: Secondary | ICD-10-CM | POA: Insufficient documentation

## 2016-10-17 DIAGNOSIS — D649 Anemia, unspecified: Secondary | ICD-10-CM | POA: Diagnosis not present

## 2016-10-17 DIAGNOSIS — R938 Abnormal findings on diagnostic imaging of other specified body structures: Secondary | ICD-10-CM

## 2016-10-17 DIAGNOSIS — D509 Iron deficiency anemia, unspecified: Secondary | ICD-10-CM | POA: Insufficient documentation

## 2016-10-17 DIAGNOSIS — D508 Other iron deficiency anemias: Secondary | ICD-10-CM | POA: Insufficient documentation

## 2016-10-17 DIAGNOSIS — I959 Hypotension, unspecified: Secondary | ICD-10-CM

## 2016-10-17 DIAGNOSIS — C8330 Diffuse large B-cell lymphoma, unspecified site: Secondary | ICD-10-CM | POA: Insufficient documentation

## 2016-10-17 LAB — CBC WITH DIFFERENTIAL/PLATELET
BASOS ABS: 0 10*3/uL (ref 0.0–0.1)
BASOS PCT: 1 %
EOS ABS: 0 10*3/uL (ref 0.0–0.7)
Eosinophils Relative: 0 %
HEMATOCRIT: 29.4 % — AB (ref 36.0–46.0)
HEMOGLOBIN: 9.7 g/dL — AB (ref 12.0–15.0)
LYMPHS ABS: 1 10*3/uL (ref 0.7–4.0)
Lymphocytes Relative: 21 %
MCH: 26 pg (ref 26.0–34.0)
MCHC: 33 g/dL (ref 30.0–36.0)
MCV: 78.8 fL (ref 78.0–100.0)
MONOS PCT: 5 %
Monocytes Absolute: 0.2 10*3/uL (ref 0.1–1.0)
NEUTROS ABS: 3.6 10*3/uL (ref 1.7–7.7)
Neutrophils Relative %: 73 %
Platelets: 291 10*3/uL (ref 150–400)
RBC: 3.73 MIL/uL — ABNORMAL LOW (ref 3.87–5.11)
RDW: 17 % — AB (ref 11.5–15.5)
WBC: 4.8 10*3/uL (ref 4.0–10.5)

## 2016-10-17 LAB — IRON AND TIBC
Iron: 8 ug/dL — ABNORMAL LOW (ref 28–170)
Saturation Ratios: 2 % — ABNORMAL LOW (ref 10.4–31.8)
TIBC: 487 ug/dL — AB (ref 250–450)
UIBC: 479 ug/dL

## 2016-10-17 LAB — PROTIME-INR
INR: 1.02
PROTHROMBIN TIME: 13.4 s (ref 11.4–15.2)

## 2016-10-17 LAB — APTT: APTT: 26 s (ref 24–36)

## 2016-10-17 MED ORDER — FERROUS SULFATE 325 (65 FE) MG PO TBEC: 325.0000 mg | DELAYED_RELEASE_TABLET | Freq: Every day | ORAL | 3 refills | Status: AC

## 2016-10-17 NOTE — Patient Instructions (Addendum)
[place] at [facility] Discharge Instructions  RECOMMENDATIONS MADE BY THE CONSULTANT AND ANY TEST RESULTS WILL BE SENT TO YOUR REFERRING PHYSICIAN.  Exam with Dr. Oliva Bustard today.   We will send a prescription for iron to the pharmacy.  We are referring you to Dr. Arnoldo Morale to do a biopsy and we will see you after the biopsy.    We will draw labs today as you leave.   Please see Amy as you leave for appointments.    Thank you for choosing [place] at [facility] to provide your oncology and hematology care.  To afford each patient quality time with our provider, please arrive at least 15 minutes before your scheduled appointment time.   Beginning January 23rd 2017 lab work for the [facility] will be done in the  Main lab at [facility] on 1st floor. If you have a lab appointment with the [facility] please come in thru the  Main Entrance and check in at the main information desk  You need to re-schedule your appointment should you arrive 10 or more minutes late.  We strive to give you quality time with our providers, and arriving late affects you and other patients whose appointments are after yours.  Also, if you no show three or more times for appointments you may be dismissed from the clinic at the providers discretion.     Again, thank you for choosing [facility].  Our hope is that these requests will decrease the amount of time that you wait before being seen by our physicians.       _____________________________________________________________  Should you have questions after your visit to [facility], please contact our office at [phone] between the hours of 8:30 a.m. and 4:30 p.m.  Voicemails left after 4:30 p.m. will not be returned until the following business day.  For prescription refill requests, have your pharmacy contact our office.         Resources For Cancer Patients and their  Caregivers ? American Cancer Society: Can assist with transportation, wigs, general needs, runs Look Good Feel Better.        913-884-6366 ? Cancer Care: Provides financial assistance, online support groups, medication/co-pay assistance.  1-800-813-HOPE (616) 543-9077) ? [facility] Assists [place] cancer patients and their families through emotional , educational and financial support.  (416)625-4212 ? [place] DSS Where to apply for food stamps, Medicaid and utility assistance. 239-008-3543 ? [facility]: Transportation to medical appointments. 2563347902 ? Social Security Administration: May apply for disability if have a Stage IV cancer. 313-096-5907 252-002-6758 ? [employer], Disability and Transit Services: Assists with nutrition, care and transit needs. [place] Support Programs: @10RELATIVEDAYS @ > Cancer Support Group  2nd Tuesday of the month 1pm-2pm, Journey Room  > Creative Journey  3rd Tuesday of the month 1130am-1pm, Journey Room  > Look Good Feel Better  1st Wednesday of the month 10am-12 noon, Journey Room (Call [place] to register [phone])

## 2016-10-17 NOTE — Telephone Encounter (Signed)
Received fax from [place] [facility].   Requested PCP to clear patient for dental work.   Call placed to patient daughter Evelyn Baird to inquire.   No answer, no VM.

## 2016-10-17 NOTE — Progress Notes (Signed)
[place] @ [facility] [phone]  Fax:([phone]) [place]: [birth year]  MR#: [id number]  JYN#:[id number]  Patient Care Team: Alycia Rossetti, MD as PCP - General (Family Medicine) Danie Binder, MD as Consulting Physician (Gastroenterology)  CHIEF COMPLAINT:  Chief Complaint  Patient presents with  . New Patient (Initial Visit)    VISIT DIAGNOSIS:     ICD-9-CM ICD-10-CM   1. Other iron deficiency anemia 280.8 D50.8 ferrous sulfate 325 (65 FE) MG EC tablet     CBC with Differential     CEA     Iron and TIBC     APTT     Protime-INR      No history exists.    Oncology Flowsheet 03/24/2014 06/21/2016 07/27/2016 08/01/2016  diphenhydrAMINE (BENADRYL) PO - 25 mg - -  ondansetron (ZOFRAN) IV 4 mg - - -  ondansetron (ZOFRAN-ODT) PO - - 4 mg 4 mg   1.Patient had a invasive adenocarcinoma of the transverse colon (diagnosis in September of 2017 by colonoscopy) No further follow-up regarding that 2.  Emergency room visit with CT scan showing axillary adenopathy intra-abdominal adenopathy with intra-abdominal mass Further workup pending INTERVAL HISTORY:  82 year old lady  of recurrent [place]  went to emergency room with increasing chest discomfort.  CT scan revealed multiple lymph nodes in the right and left axillary area.  An abdominal lymph node with liver abnormality.  Patient is here for further follow-up.  Significant weight loss.  Poor appetite.  Feeling weak and tired.  Denies any rectal bleeding. Patient is accompanied with her daughter and granddaughter  REVIEW OF SYSTEMS:   Gen. status: Patient is feeling weak and tired.  Significant weight loss   And   poor appetite. Lungs: Increasing shortness of breath on exertion and chest discomfort Cardiac: No chest pain other than chest discomfort intermittently GI: Poor appetite.  No rectal bleeding.  Had colonoscopy done in September and was found to have a polyp in transverse  colon which was malignant. Neurological system: Numbness in both lower extremity. Pain in the left lower extremity.  Intermittent swelling. Skin: No rash HEENT: Denies any headache.  Occasionally complains of facial pain all other systems have been reviewed and no abnormality detected   As per HPI. Otherwise, a complete review of systems is negatve.  PAST MEDICAL HISTORY: Past Medical History:  Diagnosis Date  . Anemia   . Coronary atherosclerosis of native coronary artery    Nonobstructive 2009 - previously followed by [facility]  . Diabetes mellitus without complication ([place])   . Essential hypertension, benign   . GERD (gastroesophageal reflux disease)   . Hyperlipidemia   . Hypertension   . Peripheral neuropathy ([place])   . Prediabetes   . PVC's (premature ventricular contractions)     PAST SURGICAL HISTORY: Past Surgical History:  Procedure Laterality Date  . BIOPSY  07/19/2016   Procedure: BIOPSY;  Surgeon: Rogene Houston, MD;  [facility]: [facility];  Service: Endoscopy;;  colon  . COLONOSCOPY N/A 07/19/2016   Procedure: COLONOSCOPY;  Surgeon: Rogene Houston, MD;  Location: [facility];  Service: Endoscopy;  Laterality: N/A;  12:25  . HERNIA REPAIR    . Left abdominal hernia repair    . POLYPECTOMY  07/19/2016   Procedure: POLYPECTOMY;  Surgeon: Rogene Houston, MD;  Location: [facility];  Service: Endoscopy;;  colon    FAMILY HISTORY Family History  Problem Relation Age of Onset  .  Hypertension Father   . Kidney disease Father     GYNECOLOGIC HISTORY:  No LMP recorded. Patient is postmenopausal.     ADVANCED DIRECTIVES:  Patient does not have any living will or healthcare power of attorney.  Information was given .  Available resources had been discussed.  We will follow-up on subsequent appointments regarding this issue  HEALTH MAINTENANCE: Social History  Substance Use Topics  . Smoking status: Never Smoker  . Smokeless tobacco: Never Used     Comment: Never  smoked  . Alcohol use No      Allergies  Allergen Reactions  . Beef-Derived Products Itching and Nausea And Vomiting  . Pork-Derived Products Itching and Nausea And Vomiting    Current Outpatient Prescriptions  Medication Sig Dispense Refill  . aspirin 81 MG chewable tablet Chew 1 tablet (81 mg total) by mouth daily. 30 tablet 0  . atorvastatin (LIPITOR) 20 MG tablet Take 1 tablet (20 mg total) by mouth at bedtime. FOR CHOLESTEROL 30 tablet 6  . ciprofloxacin (CIPRO) 250 MG tablet Take 1 tablet by mouth every 12 (twelve) hours.    Marland Kitchen lisinopril (PRINIVIL,ZESTRIL) 10 MG tablet Take 1 tablet (10 mg total) by mouth daily. 30 tablet 6  . meclizine (ANTIVERT) 25 MG tablet Take 1 tablet (25 mg total) by mouth 4 (four) times daily as needed for dizziness. 30 tablet 0  . NITROSTAT 0.4 MG SL tablet DISSOLVE (1) TABLET UNDER TONGUE AS NEEDED TO RELIEVE CHEST PAIN. MAYREPEAT EVERY 5 MINUTES. 25 tablet 0  . pantoprazole (PROTONIX) 40 MG tablet Take 1 tablet (40 mg total) by mouth daily. 30 tablet 6  . Pediatric Multivitamins-Iron (FLINTSTONES PLUS IRON PO) Take 1 tablet by mouth daily.    Marland Kitchen trimethoprim-polymyxin b (POLYTRIM) ophthalmic solution Place 1 drop into the left eye every 6 (six) hours. For 5 days 10 mL 0  . ferrous sulfate 325 (65 FE) MG EC tablet Take 1 tablet (325 mg total) by mouth daily. 100 tablet 3   No current facility-administered medications for this visit.     OBJECTIVE: PHYSICAL EXAM: Gen. status: Patient is alert oriented not any acute distress Lymphatic system: Palpable lymphadenopathy in the left axillary area and right axillary area. Examination of both breasts was without any abnormality Lungs: Diminished at entry on both sides.  No crepitation.  No rhonchi.  No rales Cardiac: Tachycardia Left lower extremity grade 1 swelling. Skin: No rash Neurological system: No significant focal signs All other systems are been examined.  Abnormality detected  Vitals:    10/17/16 1333  BP: (!) 88/64  Pulse: 85  Resp: 16  Temp: 99.4 F (37.4 C)     Body mass index is 21.28 kg/m.    ECOG FS:1 - Symptomatic but completely ambulatory  LAB RESULTS:  Admission on 10/13/2016, Discharged on 10/13/2016  Component Date Value Ref Range Status  . Sodium 10/13/2016 136  135 - 145 mmol/L Final  . Potassium 10/13/2016 4.1  3.5 - 5.1 mmol/L Final  . Chloride 10/13/2016 103  101 - 111 mmol/L Final  . CO2 10/13/2016 24  22 - 32 mmol/L Final  . Glucose, Bld 10/13/2016 121* 65 - 99 mg/dL Final  . BUN 15/55/7770 14  6 - 20 mg/dL Final  . Creatinine, Ser 10/13/2016 0.99  0.44 - 1.00 mg/dL Final  . Calcium 17/97/1290 9.3  8.9 - 10.3 mg/dL Final  . GFR calc non Af Amer 10/13/2016 51* >60 mL/min Final  . GFR calc Af [place]ago 10/13/2016  59* >60 mL/min Final   Comment: (NOTE) The eGFR has been calculated using the CKD EPI equation. This calculation has not been validated in all clinical situations. eGFR's persistently <60 mL/min signify possible Chronic Kidney Disease.   . Anion gap 10/13/2016 9  5 - 15 Final  . WBC 10/13/2016 4.1  4.0 - 10.5 K/uL Final  . RBC 10/13/2016 3.94  3.87 - 5.11 MIL/uL Final  . Hemoglobin 10/13/2016 10.4* 12.0 - 15.0 g/dL Final  . HCT 10/13/2016 31.1* 36.0 - 46.0 % Final  . MCV 10/13/2016 78.9  78.0 - 100.0 fL Final  . MCH 10/13/2016 26.4  26.0 - 34.0 pg Final  . MCHC 10/13/2016 33.4  30.0 - 36.0 g/dL Final  . RDW 10/13/2016 17.3* 11.5 - 15.5 % Final  . Platelets 10/13/2016 176  150 - 400 K/uL Final  . Troponin i, poc 10/13/2016 0.00  0.00 - 0.08 ng/mL Final  . Comment 3 10/13/2016          Final   Comment: Due to the release kinetics of cTnI, a negative result within the first hours of the onset of symptoms does not rule out myocardial infarction with certainty. If myocardial infarction is still suspected, repeat the test at appropriate intervals.   Marland Kitchen D-Dimer, Quant 10/13/2016 11.55* 0.00 - 0.50 ug/mL-FEU Final   Comment: (NOTE) At the  manufacturer cut-off of 0.50 ug/mL FEU, this assay has been documented to exclude PE with a sensitivity and negative predictive value of 97 to 99%.  At this time, this assay has not been approved by the FDA to exclude DVT/VTE. Results should be correlated with clinical presentation.   . Color, Urine 10/13/2016 YELLOW  YELLOW Final  . APPearance 10/13/2016 CLEAR  CLEAR Final  . Specific Gravity, Urine 10/13/2016 <1.005* 1.005 - 1.030 Final  . pH 10/13/2016 6.0  5.0 - 8.0 Final  . Glucose, UA 10/13/2016 NEGATIVE  NEGATIVE mg/dL Final  . Hgb urine dipstick 10/13/2016 NEGATIVE  NEGATIVE Final  . Bilirubin Urine 10/13/2016 NEGATIVE  NEGATIVE Final  . Ketones, ur 10/13/2016 NEGATIVE  NEGATIVE mg/dL Final  . Protein, ur 10/13/2016 NEGATIVE  NEGATIVE mg/dL Final  . Nitrite 10/13/2016 NEGATIVE  NEGATIVE Final  . Leukocytes, UA 10/13/2016 NEGATIVE  NEGATIVE Final     STUDIES: Dg Chest 2 View  Result Date: 10/13/2016 CLINICAL DATA:  Chest pain and shortness of breath since this morning. Back pain. EXAM: CHEST  2 VIEW COMPARISON:  08/01/2016. FINDINGS: Stable enlarged cardiac silhouette and tortuous and partially calcified thoracic aorta. Stable calcified granulomata in the left upper lobe. The interstitial markings remain mildly prominent. Thoracic spine degenerative changes. Diffuse osteopenia. Mild bilateral shoulder degenerative changes. IMPRESSION: 1. No acute abnormality. 2. Stable mild cardiomegaly and mild chronic interstitial lung disease. 3. Mild aortic atherosclerosis. Electronically Signed   By: Claudie Revering M.D.   On: 10/13/2016 13:20   Ct Angio Chest Pe W And/or Wo Contrast  Result Date: 10/13/2016 CLINICAL DATA:  Right-sided chest pain for 1 week. Elevated D-dimer. Newly diagnosed colon adenocarcinoma. EXAM: CT ANGIOGRAPHY CHEST CT ABDOMEN AND PELVIS WITH CONTRAST TECHNIQUE: Multidetector CT imaging of the chest was performed using the standard protocol during bolus administration of  intravenous contrast. Multiplanar CT image reconstructions and MIPs were obtained to evaluate the vascular anatomy. Multidetector CT imaging of the abdomen and pelvis was performed using the standard protocol during bolus administration of intravenous contrast. CONTRAST:  100 mL Isovue 370 COMPARISON:  Chest CTA on 08/04/2015 and noncontrast AP CT  on 05/12/2016 FINDINGS: CTA CHEST FINDINGS Cardiovascular: Satisfactory opacification of pulmonary arteries noted, and no pulmonary emboli identified. No evidence of thoracic aortic dissection or aneurysm. Mild cardiomegaly. Aortic atherosclerosis. Coronary artery calcification. Mediastinum/Nodes: Increase mediastinal lymphadenopathy in the subcarinal region measuring 1.6 cm on image 45/5 compared to 1.0 cm previously. No other mediastinal or hilar lymphadenopathy identified. Increased bilateral axillary and subpectoral lymphadenopathy since prior exam, with largest index lymph node in the left axilla measuring 3.3 cm short axis on image 30/5 compared to 2.0 cm previously. Index lymph node in the right axilla measures 1.2 cm on image 22/5 and is new since prior study. Lungs/Pleura: Mild bilateral lower lobe scarring again seen. No evidence of pulmonary infiltrate or pleural effusion. Multiple small calcified granulomas are seen within the left lung, however no suspicious pulmonary nodules or masses are identified. Musculoskeletal: No suspicious bone lesions or other significant abnormality identified. Review of the MIP images confirms the above findings. CT ABDOMEN and PELVIS FINDINGS Hepatobiliary: Multiple small hypovascular masses are seen within the right hepatic lobe, measuring up to 1.2 cm. Small liver metastases cannot be excluded. Gallbladder is unremarkable. Pancreas:  No mass or inflammatory changes. Spleen: Within normal limits in size and appearance. Adrenals/Urinary Tract: No masses identified. Tiny renal cysts noted bilaterally. No evidence of hydronephrosis.  Unremarkable urinary bladder. Stomach/Bowel: Short segment circumferential wall thickening and enhancement seen involving the transverse colon, consistent with recently diagnosed colon carcinoma. No evidence of bowel obstruction. Vascular/Lymphatic: 1.2 cm pericolonic lymph node is seen in the right anterior abdomen just superior to the transverse colon on image 28/13, suspicious for local lymph node metastasis. Other small sub-cm mesenteric and retroperitoneal lymph nodes are seen which are not pathologically enlarged. No abdominal aortic aneurysm.  Aortic atherosclerosis. Reproductive: Previous hysterectomy. Soft tissue stranding seen within the pelvic mesenteric fat. Ill-defined soft tissue density is seen in the presacral soft tissues and along the left pelvic sidewall. This is difficult to measure due to its ill-defined nature, but is approximately 2.5 x 5.5 cm on image 50/13. This is suspicious for lymphoma or metastatic disease. No evidence of ascites. Other:  None. Musculoskeletal:  No suspicious bone lesions identified. IMPRESSION: No evidence of pulmonary embolism.  No active lung disease. Short segment circumferential wall thickening involving the transverse colon, consistent with recently diagnosed colon carcinoma. 1.2 cm pericolonic lymph node, suspicious for local lymph node metastasis. Several small low-attenuation masses in right hepatic lobe measuring up to 1.2 cm. These are suspicious for liver metastases. Abdomen MRI without and with contrast recommended for further characterization. Increased bilateral axillary, subpectoral, and mediastinal lymphadenopathy. This is atypical for metastatic colon carcinoma, and lymphoma cannot be excluded. Consider percutaneous needle biopsy of left axillary lymphadenopathy for tissue diagnosis. Ill-defined abnormal soft tissue density in the presacral region and along the left pelvic sidewall. Differential diagnosis includes lymphoma and metastatic disease.  Electronically Signed   By: Earle Gell M.D.   On: 10/13/2016 18:01   Ct Abdomen Pelvis W Contrast  Result Date: 10/13/2016 CLINICAL DATA:  Right-sided chest pain for 1 week. Elevated D-dimer. Newly diagnosed colon adenocarcinoma. EXAM: CT ANGIOGRAPHY CHEST CT ABDOMEN AND PELVIS WITH CONTRAST TECHNIQUE: Multidetector CT imaging of the chest was performed using the standard protocol during bolus administration of intravenous contrast. Multiplanar CT image reconstructions and MIPs were obtained to evaluate the vascular anatomy. Multidetector CT imaging of the abdomen and pelvis was performed using the standard protocol during bolus administration of intravenous contrast. CONTRAST:  100 mL Isovue 370 COMPARISON:  Chest CTA on 08/04/2015 and noncontrast AP CT on 05/12/2016 FINDINGS: CTA CHEST FINDINGS Cardiovascular: Satisfactory opacification of pulmonary arteries noted, and no pulmonary emboli identified. No evidence of thoracic aortic dissection or aneurysm. Mild cardiomegaly. Aortic atherosclerosis. Coronary artery calcification. Mediastinum/Nodes: Increase mediastinal lymphadenopathy in the subcarinal region measuring 1.6 cm on image 45/5 compared to 1.0 cm previously. No other mediastinal or hilar lymphadenopathy identified. Increased bilateral axillary and subpectoral lymphadenopathy since prior exam, with largest index lymph node in the left axilla measuring 3.3 cm short axis on image 30/5 compared to 2.0 cm previously. Index lymph node in the right axilla measures 1.2 cm on image 22/5 and is new since prior study. Lungs/Pleura: Mild bilateral lower lobe scarring again seen. No evidence of pulmonary infiltrate or pleural effusion. Multiple small calcified granulomas are seen within the left lung, however no suspicious pulmonary nodules or masses are identified. Musculoskeletal: No suspicious bone lesions or other significant abnormality identified. Review of the MIP images confirms the above findings. CT  ABDOMEN and PELVIS FINDINGS Hepatobiliary: Multiple small hypovascular masses are seen within the right hepatic lobe, measuring up to 1.2 cm. Small liver metastases cannot be excluded. Gallbladder is unremarkable. Pancreas:  No mass or inflammatory changes. Spleen: Within normal limits in size and appearance. Adrenals/Urinary Tract: No masses identified. Tiny renal cysts noted bilaterally. No evidence of hydronephrosis. Unremarkable urinary bladder. Stomach/Bowel: Short segment circumferential wall thickening and enhancement seen involving the transverse colon, consistent with recently diagnosed colon carcinoma. No evidence of bowel obstruction. Vascular/Lymphatic: 1.2 cm pericolonic lymph node is seen in the right anterior abdomen just superior to the transverse colon on image 28/13, suspicious for local lymph node metastasis. Other small sub-cm mesenteric and retroperitoneal lymph nodes are seen which are not pathologically enlarged. No abdominal aortic aneurysm.  Aortic atherosclerosis. Reproductive: Previous hysterectomy. Soft tissue stranding seen within the pelvic mesenteric fat. Ill-defined soft tissue density is seen in the presacral soft tissues and along the left pelvic sidewall. This is difficult to measure due to its ill-defined nature, but is approximately 2.5 x 5.5 cm on image 50/13. This is suspicious for lymphoma or metastatic disease. No evidence of ascites. Other:  None. Musculoskeletal:  No suspicious bone lesions identified. IMPRESSION: No evidence of pulmonary embolism.  No active lung disease. Short segment circumferential wall thickening involving the transverse colon, consistent with recently diagnosed colon carcinoma. 1.2 cm pericolonic lymph node, suspicious for local lymph node metastasis. Several small low-attenuation masses in right hepatic lobe measuring up to 1.2 cm. These are suspicious for liver metastases. Abdomen MRI without and with contrast recommended for further  characterization. Increased bilateral axillary, subpectoral, and mediastinal lymphadenopathy. This is atypical for metastatic colon carcinoma, and lymphoma cannot be excluded. Consider percutaneous needle biopsy of left axillary lymphadenopathy for tissue diagnosis. Ill-defined abnormal soft tissue density in the presacral region and along the left pelvic sidewall. Differential diagnosis includes lymphoma and metastatic disease. Electronically Signed   By: Earle Gell M.D.   On: 10/13/2016 18:01    ASSESSMENT:  1.  Carcinoma of transverse colon.  Diagnoses by colonoscopy patient did not have any follow-up since then (diagnosis in September of 2017) 2.  Abnormal CT scan of the abdomen and chest with multiple enlarged mediastinal and axillary and pelvic lymphadenopathy  Low-density lesion in the liver  3.clinical picture is either suggestive of metastatic  colon cancer versus second process like lymphoma or any other malignancy   4.  Patient also anemic will assess with [place] binding capacity and  ferritin start patient on oral iron therapy 5.  There is swelling of the left lower extremity with tenderness most likely this swelling which is grade 1 may be secondary to enlarged lymph node in the pelvic area however possibility of underlying DVT cannot be ruled out.  I have ordered a ultrasound of the left lower extremity and to be evaluated by physician assistant Larna Daughters would review the results and if there is evidence of deep vein thrombosis can be treated appropriately  6.  Patient's blood pressure is low which has been recorded to be low in the past.  Patient is on lisinopril and was advised to stop lisinopril and blood pressure will be rechecked. CEA will be assessed Appointment has been made with surgeon for core biopsy of the lymph node for histopathological evaluation and if it is lymphoma of further molecular markers and in detail workup for lymphoma pathology can be done.   Patient    had a previous history of stroke.  Undefined.   I had prolonged discussion with patient's daughter and granddaughter and the patient explaining need to follow-up with all the appointments All the x-rays and CT scan has been reviewed independently  PLAN:  No problem-specific Assessment & Plan notes found for this encounter.   Patient expressed understanding and was in agreement with this plan. She also understands that She can call clinic at any time with any questions, concerns, or complaints.    No matching staging information was found for the patient.  Forest Gleason, MD   10/17/2016 2:08 PM

## 2016-10-17 NOTE — Telephone Encounter (Signed)
Received call from patient daughter Janeice Robinson.   Requested return call.   Call placed to [place] at [phone]. No answer. No VM.

## 2016-10-18 ENCOUNTER — Other Ambulatory Visit (HOSPITAL_COMMUNITY): Payer: Self-pay | Admitting: Oncology

## 2016-10-18 LAB — CEA: CEA: 3.7 ng/mL (ref 0.0–4.7)

## 2016-10-18 NOTE — Telephone Encounter (Signed)
Call placed to patient daughter Janeice Robinson.   VM full.

## 2016-10-21 ENCOUNTER — Encounter (HOSPITAL_COMMUNITY)
Admission: RE | Admit: 2016-10-21 | Discharge: 2016-10-21 | Disposition: A | Discharge status: Home / Self Care | Payer: Medicare Other | Source: Ambulatory Visit | Attending: General Surgery | Admitting: General Surgery

## 2016-10-21 ENCOUNTER — Other Ambulatory Visit: Payer: Self-pay

## 2016-10-21 ENCOUNTER — Emergency Department (HOSPITAL_COMMUNITY)
Admission: EM | Admit: 2016-10-21 | Discharge: 2016-10-21 | Disposition: A | Discharge status: Home / Self Care | Payer: Medicare Other | Attending: Emergency Medicine | Admitting: Emergency Medicine

## 2016-10-21 ENCOUNTER — Encounter (HOSPITAL_COMMUNITY): Payer: Self-pay | Admitting: Emergency Medicine

## 2016-10-21 ENCOUNTER — Emergency Department (HOSPITAL_COMMUNITY): Payer: Medicare Other

## 2016-10-21 DIAGNOSIS — I10 Essential (primary) hypertension: Secondary | ICD-10-CM | POA: Diagnosis not present

## 2016-10-21 DIAGNOSIS — Z79899 Other long term (current) drug therapy: Secondary | ICD-10-CM | POA: Diagnosis not present

## 2016-10-21 DIAGNOSIS — C189 Malignant neoplasm of colon, unspecified: Secondary | ICD-10-CM | POA: Insufficient documentation

## 2016-10-21 DIAGNOSIS — Z8505 Personal history of malignant neoplasm of liver: Secondary | ICD-10-CM | POA: Diagnosis not present

## 2016-10-21 DIAGNOSIS — R531 Weakness: Secondary | ICD-10-CM | POA: Diagnosis present

## 2016-10-21 DIAGNOSIS — E119 Type 2 diabetes mellitus without complications: Secondary | ICD-10-CM | POA: Insufficient documentation

## 2016-10-21 DIAGNOSIS — I2510 Atherosclerotic heart disease of native coronary artery without angina pectoris: Secondary | ICD-10-CM | POA: Diagnosis not present

## 2016-10-21 LAB — CBC
HEMATOCRIT: 30.7 % — AB (ref 36.0–46.0)
HEMOGLOBIN: 10.2 g/dL — AB (ref 12.0–15.0)
MCH: 26 pg (ref 26.0–34.0)
MCHC: 33.2 g/dL (ref 30.0–36.0)
MCV: 78.3 fL (ref 78.0–100.0)
Platelets: 256 10*3/uL (ref 150–400)
RBC: 3.92 MIL/uL (ref 3.87–5.11)
RDW: 16.3 % — ABNORMAL HIGH (ref 11.5–15.5)
WBC: 5.2 10*3/uL (ref 4.0–10.5)

## 2016-10-21 LAB — BASIC METABOLIC PANEL WITH GFR
Anion gap: 9 (ref 5–15)
BUN: 10 mg/dL (ref 6–20)
CO2: 23 mmol/L (ref 22–32)
Calcium: 8.9 mg/dL (ref 8.9–10.3)
Chloride: 102 mmol/L (ref 101–111)
Creatinine, Ser: 0.71 mg/dL (ref 0.44–1.00)
GFR calc Af Amer: >60 mL/min (ref >60)
GFR calc non Af Amer: >60 mL/min (ref >60)
Glucose, Bld: 88 mg/dL (ref 65–99)
Potassium: 3.9 mmol/L (ref 3.5–5.1)
Sodium: 134 mmol/L — ABNORMAL LOW (ref 135–145)

## 2016-10-21 LAB — URINALYSIS, ROUTINE W REFLEX MICROSCOPIC
Bilirubin Urine: NEGATIVE
Glucose, UA: NEGATIVE mg/dL
Hgb urine dipstick: NEGATIVE
Ketones, ur: NEGATIVE mg/dL
Leukocytes, UA: NEGATIVE
Nitrite: NEGATIVE
Protein, ur: 30 mg/dL — AB
Specific Gravity, Urine: >1.03 — ABNORMAL HIGH (ref 1.005–1.030)
pH: 5.5 (ref 5.0–8.0)

## 2016-10-21 LAB — POC OCCULT BLOOD, ED: Fecal Occult Bld: NEGATIVE

## 2016-10-21 LAB — URINALYSIS, MICROSCOPIC (REFLEX)

## 2016-10-21 LAB — CBG MONITORING, ED: Glucose-Capillary: 196 mg/dL — ABNORMAL HIGH (ref 65–99)

## 2016-10-21 NOTE — ED Notes (Signed)
Pt and family informed of d/c instructions and follow up care. Family and pt stated understanding, no further concerns or questions at this time.

## 2016-10-21 NOTE — Pre-Procedure Instructions (Signed)
Called patient to go over preop information. Patient is extremely SOB when she answers the phone and states she is "very weak and cannot hardly go this morning". Her son is on the way to take her to see her PCP. Patient states she does not think she will be well enough to have surgery on Wednesday. Called Dr Adline Mango office and spoke with Jackelyn Poling and informed her of above information.

## 2016-10-21 NOTE — Telephone Encounter (Signed)
Call placed to patient daughter Janeice Robinson.   VM full.

## 2016-10-21 NOTE — ED Notes (Signed)
Pt reports she has had "this sickness" for a while. Recently tx for UTI, family concerned that pt has not been taking antibx as prescribed.

## 2016-10-21 NOTE — Discharge Instructions (Signed)
Your weakness is likely due to underlying cancer that you were recently diagnosed with. It is important for you follow-up for your biopsy tomorrow as scheduled and follow-up with the oncologist.  Return for worsening symptoms, including fever, confusion, or any other symptoms concerning to you.  We have ordered home health for you, and our case manager will try to set that up for you in 1-2 days

## 2016-10-21 NOTE — ED Triage Notes (Signed)
Pt reports weakness for over a month. Pt states she has had decreased appetite and "I have to hold onto something when I'm trying to go to the bathroom." Pt has been on prescriptions for a bladder infection and has been given iron.

## 2016-10-21 NOTE — ED Provider Notes (Signed)
[facility] DEPT Provider Note   CSN: [id number] Arrival date & time: 10/21/16  1322     History   Chief Complaint Chief Complaint  Patient presents with  . Weakness    HPI Evelyn Baird is a 82 y.o. female.  HPI  82 year old female who presents with generalized weakness. This is been ongoing for one to 2 months. She has a history of anemia, diabetes, coronary artery disease, hypertension hyperlipidemia. According to patient's son, she has had recent diagnosis of colon cancer with metastases in the liver. She has biopsy scheduled for tomorrow with oncologist. Patient also seen in the emergency department over one week ago for shortness of breath. She still states that she has fatigue and shortness of breath with activity. Lives at home alone, and feels that she is unable to care for self. Has not had cough, fevers, chest pain, abdominal pain, nausea or vomiting, diarrhea, hematochezia. Has noted some dark stools intermittently, but states that this changes depending on what she eats.  Past Medical History:  Diagnosis Date  . Anemia   . Coronary atherosclerosis of native coronary artery    Nonobstructive 2009 - previously followed by [facility]  . Diabetes mellitus without complication ([place])   . Essential hypertension, benign   . GERD (gastroesophageal reflux disease)   . Hyperlipidemia   . Hypertension   . Peripheral neuropathy ([place])   . Prediabetes   . PVC's (premature ventricular contractions)     Patient Active Problem List   Diagnosis Date Noted  . Hypotension 09/16/2016  . CVA (cerebral infarction) 08/01/2016  . History of cerebellar stroke 08/01/2016  . IDA (iron deficiency anemia) 06/18/2016  . Guaiac + stool 06/18/2016  . Peripheral edema 04/30/2016  . Esophageal dysmotility 06/20/2015  . Dysphagia 05/09/2015  . Anemia 03/23/2014  . Hereditary and idiopathic peripheral neuropathy 12/06/2013  . Aortic valve disorders 04/03/2012  . Hyperlipidemia 10/15/2006  .  Essential hypertension, benign 10/15/2006  . Coronary atherosclerosis of native coronary artery 10/15/2006  . GERD 10/15/2006    Past Surgical History:  Procedure Laterality Date  . BIOPSY  07/19/2016   Procedure: BIOPSY;  Surgeon: Rogene Houston, MD;  Location: [facility];  Service: Endoscopy;;  colon  . COLONOSCOPY N/A 07/19/2016   Procedure: COLONOSCOPY;  Surgeon: Rogene Houston, MD;  Location: [facility] SU[facility];  Service: Endoscopy;  Laterality: N/A;  12:25  . HERNIA REPAIR    . Left abdominal hernia repair    . POLYPECTOMY  07/19/2016   Procedure: POLYPECTOMY;  Surgeon: Rogene Houston, MD;  Location: [facility];  Service: Endoscopy;;  colon    OB History    Gravida Para Term Preterm AB Living   7 7 7     6    SAB TAB Ectopic Multiple Live Births                   Home Medications    Prior to Admission medications   Medication Sig Start Date End Date Taking? Authorizing Provider  ferrous sulfate 325 (65 FE) MG EC tablet Take 1 tablet (325 mg total) by mouth daily. 10/17/16  Yes Janak Choksi, MD  NITROSTAT 0.4 MG SL tablet DISSOLVE (1) TABLET UNDER TONGUE AS NEEDED TO RELIEVE CHEST PAIN. MAYREPEAT EVERY 5 MINUTES. 05/28/16  Yes Lendon Colonel, NP  aspirin 81 MG chewable tablet Chew 1 tablet (81 mg total) by mouth daily. Patient not taking: Reported on 10/21/2016 08/03/16   Kathie Dike, MD  atorvastatin (LIPITOR)  20 MG tablet Take 1 tablet (20 mg total) by mouth at bedtime. FOR CHOLESTEROL 09/16/16   Alycia Rossetti, MD  lisinopril (PRINIVIL,ZESTRIL) 10 MG tablet Take 1 tablet (10 mg total) by mouth daily. 09/16/16   Alycia Rossetti, MD  meclizine (ANTIVERT) 25 MG tablet Take 1 tablet (25 mg total) by mouth 4 (four) times daily as needed for dizziness. 07/27/16   Rolland Porter, MD  pantoprazole (PROTONIX) 40 MG tablet Take 1 tablet (40 mg total) by mouth daily. 09/16/16   Alycia Rossetti, MD  trimethoprim-polymyxin b (POLYTRIM) ophthalmic solution Place 1 drop into the left eye  every 6 (six) hours. For 5 days Patient not taking: Reported on 10/18/2016 09/16/16   Alycia Rossetti, MD    Family History Family History  Problem Relation Age of Onset  . Hypertension Father   . Kidney disease Father     Social History Social History  Substance Use Topics  . Smoking status: Never Smoker  . Smokeless tobacco: Never Used     Comment: Never smoked  . Alcohol use No     Allergies   Beef-derived products and Pork-derived products   Review of Systems Review of Systems 10/14 systems reviewed and are negative other than those stated in the HPI  Physical Exam Updated Vital Signs BP 124/76 (BP Location: Right Arm)   Pulse 82   Temp 99.9 F (37.7 C) (Oral)   Resp 18   SpO2 100%   Physical Exam Physical Exam  Nursing note and vitals reviewed. Constitutional: Elderly and frail appearing, non-toxic, and in no acute distress Head: Normocephalic and atraumatic.  Mouth/Throat: Oropharynx is clear and moist.  Neck: Normal range of motion. Neck supple.  Cardiovascular: Normal rate and regular rhythm.  no edema Pulmonary/Chest: Effort normal and breath sounds normal.  Abdominal: Soft. There is no tenderness. There is no rebound and no guarding.  Musculoskeletal: Normal range of motion.  Neurological: Alert, no facial droop, fluent speech, moves all extremities symmetrically Skin: Skin is warm and dry.  Psychiatric: Cooperative   ED Treatments / Results  Labs (all labs ordered are listed, but only abnormal results are displayed) Labs Reviewed  BASIC METABOLIC PANEL - Abnormal; Notable for the following:       Result Value   Sodium 134 (*)    All other components within normal limits  CBC - Abnormal; Notable for the following:    Hemoglobin 10.2 (*)    HCT 30.7 (*)    RDW 16.3 (*)    All other components within normal limits  URINALYSIS, ROUTINE W REFLEX MICROSCOPIC - Abnormal; Notable for the following:    Specific Gravity, Urine >1.030 (*)     Protein, ur 30 (*)    All other components within normal limits  URINALYSIS, MICROSCOPIC (REFLEX) - Abnormal; Notable for the following:    Bacteria, UA MANY (*)    Squamous Epithelial / LPF 0-5 (*)    All other components within normal limits  CBG MONITORING, ED  POC OCCULT BLOOD, ED    EKG  EKG Interpretation None       Radiology No results found.  Procedures Procedures (including critical care time)  Medications Ordered in ED Medications - No data to display   Initial Impression / Assessment and Plan / ED Course  I have reviewed the triage vital signs and the nursing notes.  Pertinent labs & imaging results that were available during my care of the patient were reviewed by me and  considered in my medical decision making (see chart for details).  Clinical Course     Presenting with several weeks of generalized weakness and fatigue and shortness of breath with minimal activity. Old records are reviewed and she was seen in the emergency department on 10/13/2016 for shortness of breath. At that time had CT chest abdomen and pelvis showing metastatic colon cancer and no evidence of PE or other acute cardiopulmonary processes.   At that time it seemed that patient was not receptive of the news that she had underlying malignancy, and again today she does not mention this and does not seem to have them that she has underlying malignancy that is likely causing her generalized weakness and fatigue. I did speak with family who states that she has biopsy tomorrow and does have oncology follow-up which I encouraged.   She has baseline anemia with no evidence of GI bleeding here today. I remainder of her blood work  is reassuring. Did have some borderline hypoglycemia here in the emergency department, but eating and drinking normally and able to bring up glucose with by mouth intake. No evidence of infection.  I do not feel that she requires admission to the hospital here today. Discuss  biopsy and oncology follow-up tomorrow as scheduled. Did speak with social work and case management consult with evaluation for home health as ordered.  Strict return and follow-up instructions reviewed. She and family expressed understanding of all discharge instructions and felt comfortable with the plan of care.   Final Clinical Impressions(s) / ED Diagnoses   Final diagnoses:  None    New Prescriptions New Prescriptions   No medications on file     Forde Dandy, MD 10/21/16 [phone]

## 2016-10-21 NOTE — ED Notes (Signed)
Per MD Liu hold on IV at this time. MD Liu notified of pt CBG, per MD give oral source of glucose.

## 2016-10-22 ENCOUNTER — Encounter (HOSPITAL_COMMUNITY): Payer: Self-pay

## 2016-10-22 ENCOUNTER — Encounter (HOSPITAL_BASED_OUTPATIENT_CLINIC_OR_DEPARTMENT_OTHER): Payer: Medicare Other

## 2016-10-22 VITALS — BP 102/65 | HR 91 | Temp 98.8°F | Resp 18

## 2016-10-22 DIAGNOSIS — D649 Anemia, unspecified: Secondary | ICD-10-CM | POA: Diagnosis not present

## 2016-10-22 DIAGNOSIS — D508 Other iron deficiency anemias: Secondary | ICD-10-CM

## 2016-10-22 LAB — CBG MONITORING, ED: GLUCOSE-CAPILLARY: 74 mg/dL (ref 65–99)

## 2016-10-22 MED ORDER — SODIUM CHLORIDE 0.9 % IV SOLN
Freq: Once | INTRAVENOUS | Status: AC
  Administered 2016-10-22: 13:00:00 via INTRAVENOUS

## 2016-10-22 MED ORDER — SODIUM CHLORIDE 0.9 % IV SOLN
510.0000 mg | Freq: Once | INTRAVENOUS | Status: AC
  Administered 2016-10-22: 510 mg via INTRAVENOUS
  Filled 2016-10-22: qty 17

## 2016-10-22 NOTE — Telephone Encounter (Signed)
Multiple calls placed to patient with no answer and no return call.   Message to be closed.  

## 2016-10-22 NOTE — Care Management (Signed)
CM consult received for [facility] services. MD has ordered [facility] nursing, pt, ot, aid, sw. Per pt's family they would like to use [facility] as they have used them in the past. Pt/family aware HH has 48hrs to make first visit. Romualdo Bolk, of [facility], made aware of referral and will obtain pt info from chart.

## 2016-10-22 NOTE — Progress Notes (Signed)
Patient tolerated infusion well.  VSS.  Patient wheeled out via wheelchair by family and stable upon discharge from the clinic.

## 2016-10-22 NOTE — Patient Instructions (Signed)
[facility] Discharge Instructions  RECOMMENDATIONS MADE BY THE CONSULTANT AND ANY TEST RESULTS WILL BE SENT TO YOUR REFERRING PHYSICIAN.  IV iron today.    Thank you for choosing [facility] at [facility] to provide your oncology and hematology care.  To afford each patient quality time with our provider, please arrive at least 15 minutes before your scheduled appointment time.   Beginning January 23rd 2017 lab work for the Cancer Center will be done in the  Main lab at [place] [facility] on 1st floor. If you have a lab appointment with the Cancer Center please come in thru the  Main Entrance and check in at the main information desk  You need to re-schedule your appointment should you arrive 10 or more minutes late.  We strive to give you quality time with our providers, and arriving late affects you and other patients whose appointments are after yours.  Also, if you no show three or more times for appointments you may be dismissed from the clinic at the providers discretion.     Again, thank you for choosing [facility].  Our hope is that these requests will decrease the amount of time that you wait before being seen by our physicians.       _____________________________________________________________  Should you have questions after your visit to [facility], please contact our office at [phone] between the hours of 8:30 a.m. and 4:30 p.m.  Voicemails left after 4:30 p.m. will not be returned until the following business day.  For prescription refill requests, have your pharmacy contact our office.         Resources For Cancer Patients and their Caregivers ? American Cancer Society: Can assist with transportation, wigs, general needs, runs Look Good Feel Better.        1-888-227-6333 ? Cancer Care: Provides financial assistance, online support groups, medication/co-pay assistance.  1-800-813-HOPE  (4673) ? [facility] Assists [place] cancer patients and their families through emotional , educational and financial support.  336-427-4357 ? [place] DSS Where to apply for food stamps, Medicaid and utility assistance. 336-342-1394 ? [facility]: Transportation to medical appointments. 336-347-2287 ? Social Security Administration: May apply for disability if have a Stage IV cancer. 336-342-7796 1-800-772-1213 ? [place] Aging, Disability and Transit Services: Assists with nutrition, care and transit needs. 336-349-2343  Cancer Center Support Programs: @10RELATIVEDAYS@ > Cancer Support Group  2nd Tuesday of the month 1pm-2pm, Journey Room  > Creative Journey  3rd Tuesday of the month 1130am-1pm, Journey Room  > Look Good Feel Better  1st Wednesday of the month 10am-12 noon, Journey Room (Call American Cancer Society to register 1-800-395-5775)    

## 2016-10-23 ENCOUNTER — Ambulatory Visit (HOSPITAL_COMMUNITY)
Admission: RE | Admit: 2016-10-23 | Discharge: 2016-10-23 | Disposition: A | Discharge status: Home / Self Care | Payer: Medicare Other | Source: Ambulatory Visit | Attending: General Surgery | Admitting: General Surgery

## 2016-10-23 ENCOUNTER — Ambulatory Visit (HOSPITAL_COMMUNITY): Payer: Medicare Other | Admitting: Anesthesiology

## 2016-10-23 ENCOUNTER — Encounter (HOSPITAL_COMMUNITY)
Admission: RE | Disposition: A | Discharge status: Home / Self Care | Payer: Self-pay | Source: Ambulatory Visit | Attending: General Surgery

## 2016-10-23 ENCOUNTER — Encounter (HOSPITAL_COMMUNITY): Payer: Self-pay | Admitting: *Deleted

## 2016-10-23 DIAGNOSIS — C8334 Diffuse large B-cell lymphoma, lymph nodes of axilla and upper limb: Secondary | ICD-10-CM | POA: Diagnosis not present

## 2016-10-23 DIAGNOSIS — G609 Hereditary and idiopathic neuropathy, unspecified: Secondary | ICD-10-CM | POA: Insufficient documentation

## 2016-10-23 DIAGNOSIS — E119 Type 2 diabetes mellitus without complications: Secondary | ICD-10-CM | POA: Diagnosis not present

## 2016-10-23 DIAGNOSIS — I2510 Atherosclerotic heart disease of native coronary artery without angina pectoris: Secondary | ICD-10-CM | POA: Diagnosis not present

## 2016-10-23 DIAGNOSIS — Z8673 Personal history of transient ischemic attack (TIA), and cerebral infarction without residual deficits: Secondary | ICD-10-CM | POA: Diagnosis not present

## 2016-10-23 DIAGNOSIS — E7800 Pure hypercholesterolemia, unspecified: Secondary | ICD-10-CM | POA: Insufficient documentation

## 2016-10-23 DIAGNOSIS — R590 Localized enlarged lymph nodes: Secondary | ICD-10-CM | POA: Diagnosis present

## 2016-10-23 DIAGNOSIS — I10 Essential (primary) hypertension: Secondary | ICD-10-CM | POA: Diagnosis not present

## 2016-10-23 DIAGNOSIS — K219 Gastro-esophageal reflux disease without esophagitis: Secondary | ICD-10-CM | POA: Diagnosis not present

## 2016-10-23 DIAGNOSIS — Z7984 Long term (current) use of oral hypoglycemic drugs: Secondary | ICD-10-CM | POA: Insufficient documentation

## 2016-10-23 DIAGNOSIS — C189 Malignant neoplasm of colon, unspecified: Secondary | ICD-10-CM | POA: Diagnosis not present

## 2016-10-23 DIAGNOSIS — Z419 Encounter for procedure for purposes other than remedying health state, unspecified: Secondary | ICD-10-CM

## 2016-10-23 HISTORY — PX: AXILLARY LYMPH NODE BIOPSY: SHX5737

## 2016-10-23 LAB — GLUCOSE, CAPILLARY
Glucose-Capillary: 97 mg/dL (ref 65–99)
Glucose-Capillary: 63 mg/dL — ABNORMAL LOW (ref 65–99)

## 2016-10-23 SURGERY — AXILLARY LYMPH NODE BIOPSY
Anesthesia: General | Laterality: Left

## 2016-10-23 MED ORDER — LIDOCAINE HCL (PF) 1 % IJ SOLN
INTRAMUSCULAR | Status: AC
  Filled 2016-10-23: qty 30

## 2016-10-23 MED ORDER — MIDAZOLAM HCL 2 MG/2ML IJ SOLN
INTRAMUSCULAR | Status: AC
  Filled 2016-10-23: qty 2

## 2016-10-23 MED ORDER — EPHEDRINE SULFATE 50 MG/ML IJ SOLN
INTRAMUSCULAR | Status: AC
  Filled 2016-10-23: qty 1

## 2016-10-23 MED ORDER — PROPOFOL 500 MG/50ML IV EMUL
INTRAVENOUS | Status: DC | PRN
  Administered 2016-10-23: 50 ug/kg/min via INTRAVENOUS

## 2016-10-23 MED ORDER — BUPIVACAINE HCL 0.5 % IJ SOLN
INTRAMUSCULAR | Status: DC | PRN
  Administered 2016-10-23: 17 mL

## 2016-10-23 MED ORDER — MIDAZOLAM HCL 5 MG/5ML IJ SOLN
INTRAMUSCULAR | Status: DC | PRN
  Administered 2016-10-23 (×2): 0.5 mg via INTRAVENOUS

## 2016-10-23 MED ORDER — PROPOFOL 10 MG/ML IV BOLUS
INTRAVENOUS | Status: AC
  Filled 2016-10-23: qty 20

## 2016-10-23 MED ORDER — ARTIFICIAL TEARS OP OINT
TOPICAL_OINTMENT | OPHTHALMIC | Status: AC
  Filled 2016-10-23: qty 3.5

## 2016-10-23 MED ORDER — HEMOSTATIC AGENTS (NO CHARGE) OPTIME
TOPICAL | Status: DC | PRN
  Administered 2016-10-23: 1 via TOPICAL

## 2016-10-23 MED ORDER — ARTIFICIAL TEARS OP OINT
TOPICAL_OINTMENT | OPHTHALMIC | Status: AC
  Filled 2016-10-23: qty 7

## 2016-10-23 MED ORDER — FENTANYL CITRATE (PF) 100 MCG/2ML IJ SOLN: 25.0000 ug | INTRAMUSCULAR | Status: DC | PRN

## 2016-10-23 MED ORDER — CHLORHEXIDINE GLUCONATE CLOTH 2 % EX PADS: 6.0000 | MEDICATED_PAD | Freq: Once | CUTANEOUS | Status: DC

## 2016-10-23 MED ORDER — 0.9 % SODIUM CHLORIDE (POUR BTL) OPTIME
TOPICAL | Status: DC | PRN
Start: 2016-10-23 — End: 2016-10-23
  Administered 2016-10-23: 1000 mL

## 2016-10-23 MED ORDER — MIDAZOLAM HCL 2 MG/2ML IJ SOLN
1.0000 mg | INTRAMUSCULAR | Status: DC | PRN
  Administered 2016-10-23 (×2): 1 mg via INTRAVENOUS

## 2016-10-23 MED ORDER — BUPIVACAINE HCL (PF) 0.5 % IJ SOLN
INTRAMUSCULAR | Status: AC
  Filled 2016-10-23: qty 30

## 2016-10-23 MED ORDER — LIDOCAINE HCL (PF) 1 % IJ SOLN
INTRAMUSCULAR | Status: AC
  Filled 2016-10-23: qty 5

## 2016-10-23 MED ORDER — FENTANYL CITRATE (PF) 250 MCG/5ML IJ SOLN
INTRAMUSCULAR | Status: AC
  Filled 2016-10-23: qty 5

## 2016-10-23 MED ORDER — HYDROCODONE-ACETAMINOPHEN 5-325 MG PO TABS: 1.0000 | ORAL_TABLET | Freq: Four times a day (QID) | ORAL | 0 refills | Status: AC | PRN

## 2016-10-23 MED ORDER — LACTATED RINGERS IV SOLN
INTRAVENOUS | Status: DC
  Administered 2016-10-23: 09:00:00 via INTRAVENOUS

## 2016-10-23 MED ORDER — SODIUM CHLORIDE 0.9 % IJ SOLN
INTRAMUSCULAR | Status: AC
  Filled 2016-10-23: qty 10

## 2016-10-23 SURGICAL SUPPLY — 48 items
ADH SKN CLS APL DERMABOND .7 (GAUZE/BANDAGES/DRESSINGS) ×1
APPLIER CLIP 11 MED OPEN (CLIP) ×3
APPLIER CLIP 9.375 SM OPEN (CLIP)
APR CLP MED 11 20 MLT OPN (CLIP) ×1
APR CLP SM 9.3 20 MLT OPN (CLIP)
BAG HAMPER (MISCELLANEOUS) ×3 IMPLANT
BLADE 15 SAFETY STRL DISP (BLADE) ×3 IMPLANT
BNDG CMPR 82X61 PLY HI ABS (GAUZE/BANDAGES/DRESSINGS) ×1
BNDG CONFORM 6X.82 1P STRL (GAUZE/BANDAGES/DRESSINGS) ×3 IMPLANT
CLIP APPLIE 11 MED OPEN (CLIP) IMPLANT
CLIP APPLIE 9.375 SM OPEN (CLIP) IMPLANT
CLOSURE WOUND 1/2 X4 (GAUZE/BANDAGES/DRESSINGS) ×1
CLOTH BEACON ORANGE TIMEOUT ST (SAFETY) ×3 IMPLANT
CONT SPEC 4OZ CLIKSEAL STRL BL (MISCELLANEOUS) ×2 IMPLANT
COVER LIGHT HANDLE STERIS (MISCELLANEOUS) ×6 IMPLANT
DECANTER SPIKE VIAL GLASS SM (MISCELLANEOUS) ×2 IMPLANT
DERMABOND ADVANCED (GAUZE/BANDAGES/DRESSINGS) ×2
DERMABOND ADVANCED .7 DNX12 (GAUZE/BANDAGES/DRESSINGS) IMPLANT
DURAPREP 26ML APPLICATOR (WOUND CARE) ×3 IMPLANT
ELECT REM PT RETURN 9FT ADLT (ELECTROSURGICAL) ×3
ELECTRODE REM PT RTRN 9FT ADLT (ELECTROSURGICAL) ×1 IMPLANT
EVACUATOR DRAINAGE 10X20 100CC (DRAIN) ×1 IMPLANT
EVACUATOR SILICONE 100CC (DRAIN) ×3
FORMALIN 10 PREFIL 480ML (MISCELLANEOUS) ×3 IMPLANT
GAUZE SPONGE 4X4 12PLY STRL (GAUZE/BANDAGES/DRESSINGS) ×3 IMPLANT
GLOVE BIOGEL PI IND STRL 7.0 (GLOVE) ×1 IMPLANT
GLOVE BIOGEL PI INDICATOR 7.0 (GLOVE) ×2
GLOVE SURG SS PI 7.5 STRL IVOR (GLOVE) ×6 IMPLANT
GOWN STRL REUS W/TWL LRG LVL3 (GOWN DISPOSABLE) ×9 IMPLANT
HEMOSTAT ARISTA ABSORB 1G (MISCELLANEOUS) ×2 IMPLANT
INST SET MINOR GENERAL (KITS) ×3 IMPLANT
KIT ROOM TURNOVER APOR (KITS) ×3 IMPLANT
MANIFOLD NEPTUNE II (INSTRUMENTS) ×3 IMPLANT
NDL HYPO 25X1 1.5 SAFETY (NEEDLE) IMPLANT
NEEDLE HYPO 25X1 1.5 SAFETY (NEEDLE) ×3 IMPLANT
NS IRRIG 1000ML POUR BTL (IV SOLUTION) ×3 IMPLANT
PACK MINOR (CUSTOM PROCEDURE TRAY) ×3 IMPLANT
PAD ARMBOARD 7.5X6 YLW CONV (MISCELLANEOUS) ×3 IMPLANT
SET BASIN LINEN APH (SET/KITS/TRAYS/PACK) ×3 IMPLANT
SPONGE INTESTINAL PEANUT (DISPOSABLE) ×3 IMPLANT
SPONGE LAP 18X18 X RAY DECT (DISPOSABLE) ×3 IMPLANT
STOCKINETTE IMPERVIOUS LG (DRAPES) ×3 IMPLANT
STRIP CLOSURE SKIN 1/2X4 (GAUZE/BANDAGES/DRESSINGS) ×2 IMPLANT
SUT ETHILON 3 0 FSL (SUTURE) ×3 IMPLANT
SUT VIC AB 3-0 SH 27 (SUTURE) ×3
SUT VIC AB 3-0 SH 27X BRD (SUTURE) ×1 IMPLANT
SUT VIC AB 4-0 PS2 27 (SUTURE) ×3 IMPLANT
SYR CONTROL 10ML LL (SYRINGE) ×2 IMPLANT

## 2016-10-23 NOTE — Discharge Instructions (Signed)
Open Lymph Node Biopsy, Care After Introduction Refer to this sheet in the next few weeks. These instructions provide you with information about caring for yourself after your procedure. Your health care provider may also give you more specific instructions. Your treatment has been planned according to current medical practices, but problems sometimes occur. Call your health care provider if you have any problems or questions after your procedure. What can I expect after the procedure? After the procedure, it is common to have:  Bruising.  Soreness.  Mild swelling. Follow these instructions at home: Medicines  Take over-the-counter and prescription medicines only as told by your health care provider.  If you were prescribed an antibiotic medicine, take it as told by your health care provider. Do not stop taking the antibiotic even if you start to feel better. Incision care  Follow instructions from your health care provider about how to take care of your incision. Make sure you:  Wash your hands with soap and water before you change your bandage (dressing). If soap and water are not available, use hand sanitizer.  Change your dressing as told by your health care provider.  Leave stitches (sutures), skin glue, or adhesive strips in place. These skin closures may need to stay in place for 2 weeks or longer. If adhesive strip edges start to loosen and curl up, you may trim the loose edges. Do not remove adhesive strips completely unless your health care provider tells you to do that.  Check your incision area every day for signs of infection. Check for:  More redness, swelling, or pain.  More fluid or blood.  Warmth.  Pus or a bad smell. Driving  Do not drive for 24 hours if you received a sedative.  Do not drive or operate heavy machinery while taking prescription pain medicine. General instructions  It is your responsibility to get the results of your procedure. Ask your  health care provider or the department performing the procedure when your results will be ready.  Return to your normal activities as told by your health care provider. Ask your health care provider what activities are safe for you.  Do not take baths, swim, or use a hot tub until your health care provider approves.  Wear compression stockings as told by your health care provider. These stockings help to prevent blood clots and reduce swelling in your legs.  Keep all follow-up visits as told by your health care provider. This is important. Contact a health care provider if:  You have more redness, swelling, or pain around your incision.  You have more fluid or blood coming from your incision.  Your incision feels warm to the touch.  You have pus or a bad smell coming from your incision.  You have a fever.  You have pain or numbness that gets worse or lasts longer than a few days. This information is not intended to replace advice given to you by your health care provider. Make sure you discuss any questions you have with your health care provider. Document Released: 11/24/2015 Document Revised: 04/04/2016 Document Reviewed: 02/22/2015  2017 Elsevier

## 2016-10-23 NOTE — Anesthesia Postprocedure Evaluation (Signed)
Anesthesia Post Note  Patient: Evelyn Baird  Procedure(s) Performed: Procedure(s) (LRB): LYMPH NODE BIOPSY, LEFT AXILLA (Left)  Patient location during evaluation: PACU Anesthesia Type: MAC Level of consciousness: awake and alert and oriented Pain management: pain level controlled Vital Signs Assessment: post-procedure vital signs reviewed and stable Respiratory status: spontaneous breathing Cardiovascular status: stable Postop Assessment: no signs of nausea or vomiting Anesthetic complications: no    Last Vitals:  Vitals:   10/23/16 0847 10/23/16 0930  BP: 128/66   Pulse: 84   Resp: 18 (!) 21  Temp: 36.7 C     Last Pain:  Vitals:   10/23/16 0847  TempSrc: Oral                 ADAMS, AMY A

## 2016-10-23 NOTE — H&P (Signed)
  NTS SOAP Note  Vital Signs:  Vitals as of: [id number]: Systolic A999333: Diastolic 75: Heart Rate 96: Temp 98.90F (Temporal): Height 48ft 2in: Weight 120Lbs 0 Ounces: BMI 21.95   BMI : 21.95 kg/m2  Subjective: This 82 year old female presents for of need for biopsy of the left aillary lymph node.  Was referred by Oncology for open biopsy.  Also has recent diagnosis of colon cancer.  Concerned about possible lymphoma.  Patient in wheelchair.  Denies any axillary pain or fevers.  Does feel weak.  Review of Symptoms:  Constitutional:fatigue Head:negative Eyes:negative sore throat Cardiovascular:negative Respiratory:dyspnea Gastrointestinheartburn, dyspepsia numbness in feet, neck pain, back pain Skin:negative Hematolgic/Lymphatic:negative Allergic/Immunologic:negative   Past Medical History:Reviewed  Past Medical History  Surgical History: hernia repair Medical Problems: DM, high cholesterol, HTN, CAD, GERD, CVA, colon cancer Allergies: beef, pork Medications: NTG prn,  recently taken off bp pills   Social History:Reviewed  Social History  Preferred Language: English Race:  Black or African American Ethnicity: Not Hispanic / Latino Age: 36 year Marital Status:  S Alcohol: no   Smoking Status: Never smoker reviewed on 10/22/2016 Functional Status reviewed on 10/22/2016 ------------------------------------------------ Bathing: Normal Cooking: Normal Dressing: Normal Driving: Normal Eating: Normal Managing Meds: Normal Oral Care: Normal Shopping: Normal Toileting: Normal Transferring: Disablilty Walking: Disablilty - uses a wheelchair Cognitive Status reviewed on 10/22/2016 ------------------------------------------------ Attention: Normal Decision Making: Normal Language: Normal Memory: Normal Motor: Normal Perception: Normal Problem Solving: Normal Visual and Spatial: Normal   Family History:Reviewed  Family Health History Family History  is Unknown    Objective Information: Frail wheelchair bound bf in NAD Head:Atraumatic; no masses; no abnormalities Neck:Supple without lymphadenopathy.  Heart:RRR, no murmur Lungs:CTA bilaterally, no wheezes, rhonchi, rales.  Breathing unlabored. Large palpable lymph node in left axilla. Dr. Donald Pore notes reviewed.  CT scan report reviewed Assessment:Lymphadenopathy, left axilla  Diagnoses: A2074308.6  R59.0 Axillary lymphadenopathy (Localized enlarged lymph nodes)  Procedures: [id number] - OFFICE OUTPATIENT NEW 30 MINUTES    Plan:  Scheduled for left axillary lymph node biopsy on 10/23/16.   Patient Education:Alternative treatments to surgery were discussed with patient (and family).Risks and benefits  of procedure including bleeding, infection, cardiopulmonary difficulties, and seroma formation were fully explained to the patient (and family) who gave informed consent. Patient/family questions were addressed.  Follow-up:Pending Surgery

## 2016-10-23 NOTE — Interval H&P Note (Signed)
History and Physical Interval Note:  10/23/2016 9:34 AM  Evelyn Baird  has presented today for surgery, with the diagnosis of lymphadenopathy  The various methods of treatment have been discussed with the patient and family. After consideration of risks, benefits and other options for treatment, the patient has consented to  Procedure(s): AXILLARY LYMPH NODE BIOPSY (Left) as a surgical intervention .  The patient's history has been reviewed, patient examined, no change in status, stable for surgery.  I have reviewed the patient's chart and labs.  Questions were answered to the patient's satisfaction.     Aviva Signs A

## 2016-10-23 NOTE — Op Note (Signed)
Patient:  Evelyn Baird  DOB:  [birth year]  MRN:  [id number]   Preop Diagnosis:  Lymphadenopathy, left axilla  Postop Diagnosis:  Same  Procedure:  Axillary lymph node biopsy, left  Surgeon:  Aviva Signs, M.D.  Anes:  Mac  Indications:  Patient is an 82 year old black female who presents with lymphadenopathy in the left axilla. She was referred by Dr. Whitney Muse of oncology for a biopsy. The risks and benefits of the procedure including bleeding, infection, cardiopulmonary difficulties, and the possibility of malignancy were fully explained to the patient, who gave informed consent.  Procedure note:  The patient was placed the supine position. After monitored anesthesia care was given, the left axilla was prepped and draped using usual sterile technique with DuraPrep. Surgical site confirmation was performed. 1% Xylocaine was used for local anesthesia.  An incision was made in the left axilla. This was taken down to multiple enlarged lymph nodes. 2 lymph nodes were excised without difficulty. A bleeding was controlled using clips. The 2 lymph nodes were sent to pathology for flow cytometry.  Arista was placed in the left axillary bag. The subcutaneous layer was reapproximated using a 3-0 Vicryl interrupted suture. The skin was closed using a 4-0 Vicryl subcuticular suture. Dermabond was applied.  All tape and needle counts were correct at the end of the procedure. Patient was awakened and transferred to PACU in stable condition.  Complications:  None  EBL:  Minimal  Specimen:  Left axillary lymph nodes

## 2016-10-23 NOTE — Transfer of Care (Signed)
Immediate Anesthesia Transfer of Care Note  Patient: Evelyn Baird  Procedure(s) Performed: Procedure(s): LYMPH NODE BIOPSY, LEFT AXILLA (Left)  Patient Location: PACU  Anesthesia Type:MAC  Level of Consciousness: awake, alert , oriented and patient cooperative  Airway & Oxygen Therapy: Patient Spontanous Breathing and Patient connected to nasal cannula oxygen  Post-op Assessment: Report given to RN and Post -op Vital signs reviewed and stable  Post vital signs: Reviewed and stable  Last Vitals:  Vitals:   10/23/16 0847 10/23/16 0930  BP: 128/66   Pulse: 84   Resp: 18 (!) 21  Temp: 36.7 C     Last Pain:  Vitals:   10/23/16 0847  TempSrc: Oral      Patients Stated Pain Goal: 5 ([id number])  Complications: No apparent anesthesia complications

## 2016-10-23 NOTE — Anesthesia Preprocedure Evaluation (Addendum)
Anesthesia Evaluation  Patient identified by MRN, date of birth, ID band Patient awake    Reviewed: Allergy & Precautions, NPO status , Patient's Chart, lab work & pertinent test results  Airway Mallampati: II  TM Distance: >3 FB     Dental  (+) Poor Dentition, Chipped, Missing, Loose, Dental Advisory Given,    Pulmonary    breath sounds clear to auscultation       Cardiovascular hypertension, + CAD  + dysrhythmias  Rhythm:Regular Rate:Normal     Neuro/Psych  Neuromuscular disease ( Hereditary and idiopathic peripheral neuropathy)    GI/Hepatic GERD  Controlled and Medicated,  Endo/Other  diabetes, Type 2, Oral Hypoglycemic Agents  Renal/GU      Musculoskeletal   Abdominal   Peds  Hematology   Anesthesia Other Findings   Reproductive/Obstetrics                             Anesthesia Physical Anesthesia Plan  ASA: III  Anesthesia Plan: MAC   Post-op Pain Management:    Induction: Intravenous  Airway Management Planned: Simple Face Mask  Additional Equipment:   Intra-op Plan:   Post-operative Plan: Extubation in OR  Informed Consent: I have reviewed the patients History and Physical, chart, labs and discussed the procedure including the risks, benefits and alternatives for the proposed anesthesia with the patient or authorized representative who has indicated his/her understanding and acceptance.     Plan Discussed with:   Anesthesia Plan Comments: (After discussion with Dr. Patsey Berthold anesthesia plan changed to [facility])      Anesthesia Quick Evaluation

## 2016-10-23 NOTE — Anesthesia Procedure Notes (Signed)
Procedure Name: MAC Date/Time: 10/23/2016 9:58 AM Performed by: Andree Elk, Malkie Wille A Pre-anesthesia Checklist: Patient identified, Timeout performed, Emergency Drugs available, Suction available and Patient being monitored Oxygen Delivery Method: Simple face mask

## 2016-10-28 ENCOUNTER — Encounter (HOSPITAL_BASED_OUTPATIENT_CLINIC_OR_DEPARTMENT_OTHER): Payer: Medicare Other | Admitting: Hematology & Oncology

## 2016-10-28 ENCOUNTER — Emergency Department (HOSPITAL_COMMUNITY): Payer: Medicare Other

## 2016-10-28 ENCOUNTER — Emergency Department (HOSPITAL_COMMUNITY)
Admission: EM | Admit: 2016-10-28 | Discharge: 2016-10-28 | Disposition: A | Discharge status: Home / Self Care | Payer: Medicare Other | Attending: Emergency Medicine | Admitting: Emergency Medicine

## 2016-10-28 ENCOUNTER — Encounter (HOSPITAL_COMMUNITY): Payer: Self-pay | Admitting: *Deleted

## 2016-10-28 ENCOUNTER — Encounter (HOSPITAL_COMMUNITY): Payer: Self-pay | Admitting: Hematology & Oncology

## 2016-10-28 VITALS — BP 118/59 | HR 78 | Temp 98.7°F | Resp 16 | Wt 111.8 lb

## 2016-10-28 DIAGNOSIS — K5903 Drug induced constipation: Secondary | ICD-10-CM | POA: Diagnosis not present

## 2016-10-28 DIAGNOSIS — C8330 Diffuse large B-cell lymphoma, unspecified site: Secondary | ICD-10-CM | POA: Diagnosis not present

## 2016-10-28 DIAGNOSIS — R609 Edema, unspecified: Secondary | ICD-10-CM

## 2016-10-28 DIAGNOSIS — D509 Iron deficiency anemia, unspecified: Secondary | ICD-10-CM | POA: Diagnosis not present

## 2016-10-28 DIAGNOSIS — C189 Malignant neoplasm of colon, unspecified: Secondary | ICD-10-CM

## 2016-10-28 DIAGNOSIS — L03112 Cellulitis of left axilla: Secondary | ICD-10-CM

## 2016-10-28 DIAGNOSIS — E119 Type 2 diabetes mellitus without complications: Secondary | ICD-10-CM | POA: Insufficient documentation

## 2016-10-28 DIAGNOSIS — G8918 Other acute postprocedural pain: Secondary | ICD-10-CM | POA: Diagnosis not present

## 2016-10-28 DIAGNOSIS — I10 Essential (primary) hypertension: Secondary | ICD-10-CM | POA: Insufficient documentation

## 2016-10-28 DIAGNOSIS — R634 Abnormal weight loss: Secondary | ICD-10-CM

## 2016-10-28 DIAGNOSIS — C787 Secondary malignant neoplasm of liver and intrahepatic bile duct: Secondary | ICD-10-CM

## 2016-10-28 DIAGNOSIS — Z7982 Long term (current) use of aspirin: Secondary | ICD-10-CM | POA: Insufficient documentation

## 2016-10-28 DIAGNOSIS — C8332 Diffuse large B-cell lymphoma, intrathoracic lymph nodes: Secondary | ICD-10-CM

## 2016-10-28 DIAGNOSIS — C184 Malignant neoplasm of transverse colon: Secondary | ICD-10-CM | POA: Diagnosis not present

## 2016-10-28 DIAGNOSIS — Z79899 Other long term (current) drug therapy: Secondary | ICD-10-CM | POA: Diagnosis not present

## 2016-10-28 DIAGNOSIS — I2510 Atherosclerotic heart disease of native coronary artery without angina pectoris: Secondary | ICD-10-CM | POA: Insufficient documentation

## 2016-10-28 DIAGNOSIS — K769 Liver disease, unspecified: Secondary | ICD-10-CM | POA: Diagnosis not present

## 2016-10-28 DIAGNOSIS — R531 Weakness: Secondary | ICD-10-CM

## 2016-10-28 MED ORDER — DOXYCYCLINE HYCLATE 100 MG PO TABS
100.0000 mg | ORAL_TABLET | Freq: Once | ORAL | Status: AC
  Administered 2016-10-28: 100 mg via ORAL
  Filled 2016-10-28: qty 1

## 2016-10-28 MED ORDER — LIDOCAINE-EPINEPHRINE 2 %-1:200000 IJ SOLN
10.0000 mL | Freq: Once | INTRAMUSCULAR | Status: AC
Start: 2016-10-28 — End: 2016-10-28
  Administered 2016-10-28: 10 mL via INTRADERMAL
  Filled 2016-10-28: qty 20

## 2016-10-28 MED ORDER — DOXYCYCLINE HYCLATE 100 MG PO CAPS: 100.0000 mg | ORAL_CAPSULE | Freq: Two times a day (BID) | ORAL | 0 refills | Status: DC

## 2016-10-28 NOTE — ED Notes (Signed)
Pt back from x-ray.

## 2016-10-28 NOTE — Discharge Instructions (Signed)
Get miralax and put one dose or 17 g in 8 ounces of water,  take 1 dose every 30 minutes for 2-3 hours or until you  get good results and then once or twice daily to prevent constipation. She may be getting an infection around the biopsy site, take the antibiotics twice a day. Keep your appointment with Dr Arnoldo Morale, the surgeon tomorrow and keep your appointment with Dr Whitney Muse later today. Return to the ED if you get a high fever.  You can take ibuprofen 400 mg 3 times a day for pain if needed.

## 2016-10-28 NOTE — Progress Notes (Signed)
Met with pt face to face.  Introduced myself and explain a little bit about my role as the patient navigator.  Pt given a card with all my information on it.  Told pt to call if they had any questions or concerns.  Pt verbalized understanding.   Pt told to call me with what they decide.  Treatment vs no treatment (hospice).  Pt could start rituxan to see if she does ok for large B cell lymphoma.  Pt given information on large cell b lymphoma and colon cancer.

## 2016-10-28 NOTE — ED Notes (Signed)
Pt to xray

## 2016-10-28 NOTE — ED Provider Notes (Signed)
[facility] DEPT Provider Note   CSN: [id number] Arrival date & time: 10/28/16  0117  Time seen 02:30 AM   History   Chief Complaint Chief Complaint  Patient presents with  . Breast Pain    HPI Evelyn Baird is a 82 y.o. female.  HPI patient states she had a lymph node biopsy done on Wednesday, December 13 in her left axilla. She states December 16 she thought it started getting larger and she thinks maybe it had some drainage. She denies fever. She states she had nausea 1-2 days ago but not now. She denies vomiting or diarrhea. She states she's been constipated and hasn't had a bowel movement 3 or 4 days. She states she still taking pain medicines once a day after her biopsy. She states she's eating soft food which is what she normally has to eat because she doesn't have her upper teeth. Patient states she is to follow-up with the oncologist later today at 1:40 PM, she is to see Dr. Arnoldo Morale on December 19 at 10:45 in the morning in follow-up for her biopsy. Patient is noted to have some pink marks in her axilla, she states her home health nurse put the marks there and told her if the swelling got bigger than that to be seen.   PCP [facility] Oncology Dr Whitney Muse Surgeon Dr Mickeal Needy  Past Medical History:  Diagnosis Date  . Anemia   . Coronary atherosclerosis of native coronary artery    Nonobstructive 2009 - previously followed by [facility]  . Diabetes mellitus without complication ([place])   . Essential hypertension, benign   . GERD (gastroesophageal reflux disease)   . Hyperlipidemia   . Hypertension   . Peripheral neuropathy ([place])   . Prediabetes   . PVC's (premature ventricular contractions)     Patient Active Problem List   Diagnosis Date Noted  . Hypotension 09/16/2016  . CVA (cerebral infarction) 08/01/2016  . History of cerebellar stroke 08/01/2016  . IDA (iron deficiency anemia) 06/18/2016  . Guaiac + stool 06/18/2016  . Peripheral edema 04/30/2016  . Esophageal  dysmotility 06/20/2015  . Dysphagia 05/09/2015  . Anemia 03/23/2014  . Hereditary and idiopathic peripheral neuropathy 12/06/2013  . Aortic valve disorders 04/03/2012  . Hyperlipidemia 10/15/2006  . Essential hypertension, benign 10/15/2006  . Coronary atherosclerosis of native coronary artery 10/15/2006  . GERD 10/15/2006    Past Surgical History:  Procedure Laterality Date  . BIOPSY  07/19/2016   Procedure: BIOPSY;  Surgeon: Rogene Houston, MD;  Location: [facility] ENDO SUITE;  Service: Endoscopy;;  colon  . COLONOSCOPY N/A 07/19/2016   Procedure: COLONOSCOPY;  Surgeon: Rogene Houston, MD;  Location: [facility] ENDO SUITE;  Service: Endoscopy;  Laterality: N/A;  12:25  . HERNIA REPAIR    . Left abdominal hernia repair    . POLYPECTOMY  07/19/2016   Procedure: POLYPECTOMY;  Surgeon: Rogene Houston, MD;  Location: [facility] ENDO SUITE;  Service: Endoscopy;;  colon    OB History    Gravida Para Term Preterm AB Living   7 7 7     6    SAB TAB Ectopic Multiple Live Births                   Home Medications    Prior to Admission medications   Medication Sig Start Date End Date Taking? Authorizing Provider  aspirin 81 MG chewable tablet Chew 1 tablet (81 mg total) by mouth daily. 08/03/16   Kathie Dike, MD  atorvastatin (LIPITOR) 20 MG tablet Take 1 tablet (20 mg total) by mouth at bedtime. FOR CHOLESTEROL 09/16/16   Alycia Rossetti, MD  doxycycline (VIBRAMYCIN) 100 MG capsule Take 1 capsule (100 mg total) by mouth 2 (two) times daily. 10/28/16   Rolland Porter, MD  ferrous sulfate 325 (65 FE) MG EC tablet Take 1 tablet (325 mg total) by mouth daily. 10/17/16   Forest Gleason, MD  HYDROcodone-acetaminophen (NORCO) 5-325 MG tablet Take 1 tablet by mouth every 6 (six) hours as needed for moderate pain. 10/23/16   Aviva Signs, MD  meclizine (ANTIVERT) 25 MG tablet Take 1 tablet (25 mg total) by mouth 4 (four) times daily as needed for dizziness. 07/27/16   Rolland Porter, MD  NITROSTAT 0.4 MG SL tablet DISSOLVE (1)  TABLET UNDER TONGUE AS NEEDED TO RELIEVE CHEST PAIN. MAYREPEAT EVERY 5 MINUTES. 05/28/16   Lendon Colonel, NP  pantoprazole (PROTONIX) 40 MG tablet Take 1 tablet (40 mg total) by mouth daily. 09/16/16   Alycia Rossetti, MD    Family History Family History  Problem Relation Age of Onset  . Hypertension Father   . Kidney disease Father     Social History Social History  Substance Use Topics  . Smoking status: Never Smoker  . Smokeless tobacco: Never Used     Comment: Never smoked  . Alcohol use No  lives at home Lives alone   Allergies   Beef-derived products and Pork-derived products   Review of Systems Review of Systems  All other systems reviewed and are negative.    Physical Exam Updated Vital Signs BP 122/78   Pulse 78   Temp 99.2 F (37.3 C) (Oral)   Resp 18   Ht 5\' 4"  (1.626 m)   Wt 124 lb (56.2 kg)   SpO2 96%   BMI 21.28 kg/m   Vital signs normal except borderline fever   Physical Exam  Constitutional: She is oriented to person, place, and time. She appears well-developed and well-nourished.  Non-toxic appearance. She does not appear ill. No distress.  HENT:  Head: Normocephalic and atraumatic.  Right Ear: External ear normal.  Left Ear: External ear normal.  Nose: Nose normal. No mucosal edema or rhinorrhea.  Mouth/Throat: Oropharynx is clear and moist and mucous membranes are normal. No dental abscesses or uvula swelling.  Eyes: Conjunctivae and EOM are normal. Pupils are equal, round, and reactive to light.  Neck: Normal range of motion and full passive range of motion without pain. Neck supple.  Cardiovascular: Normal rate, regular rhythm and normal heart sounds.  Exam reveals no gallop and no friction rub.   No murmur heard. Pulmonary/Chest: Effort normal and breath sounds normal. No respiratory distress. She has no wheezes. She has no rhonchi. She has no rales. She exhibits no tenderness and no crepitus.  Abdominal: Soft. Normal appearance  and bowel sounds are normal. She exhibits no distension. There is no tenderness. There is no rebound and no guarding.  Musculoskeletal: Normal range of motion. She exhibits no edema or tenderness.  Moves all extremities well.   Lymphadenopathy:  Patient has sutures in a linear surgical incision in her left axilla. She has a 5 x 6 cm mass in that area. However its width in the Inc. marks that were drawn earlier by her home health nurse. There is no drainage seen. There is no dehiscence seen. There is maybe some increased redness to the skin over the swollen area.  Neurological: She is alert and oriented  to person, place, and time. She has normal strength. No cranial nerve deficit.  Skin: Skin is warm, dry and intact. No rash noted. No erythema. No pallor.  Psychiatric: She has a normal mood and affect. Her speech is normal and behavior is normal. Her mood appears not anxious.  Nursing note and vitals reviewed.    ED Treatments / Results  Labs (all labs ordered are listed, but only abnormal results are displayed) Labs Reviewed  AEROBIC/ANAEROBIC CULTURE (SURGICAL/DEEP WOUND)       Radiology Dg Abd 2 Views  Result Date: 10/28/2016 CLINICAL DATA:  Constipation for 2-3 days. On pain medication. History of colon cancer. EXAM: ABDOMEN - 2 VIEW COMPARISON:  CT abdomen and pelvis October 13, 2016 FINDINGS: Bowel gas pattern is nondilated and nonobstructive. Moderate amount of retained large bowel stool. Small metallic foreign body projects in the RIGHT abdomen. No intra- abdominal mass effect or pathologic calcifications. Included view of the chest demonstrates cardiomegaly and coarsened pulmonary interstitium. Surgical clips projecting in LEFT breast. Osseous structures are nonsuspicious. Whiskering of the iliac bones associated with DISH. Phleboliths project in the pelvis. IMPRESSION: Moderate amount of retained large bowel stool, normal bowel gas pattern. Electronically Signed   By: Elon Alas M.D.   On: 10/28/2016 04:19    Procedures Procedures (including critical care time)   04:50 AM Time out called. Verbal consent obtained.  Patient's swollen area in her left axilla was cleaned with Betadine swabs. The skin was anesthetized with 2% lidocaine with 1% epinephrine. An 18-gauge needle was used to aspirate the area. There is less than a half cc of clear bloody fluid returned. This was sent for culture.    Medications Ordered in ED Medications  doxycycline (VIBRA-TABS) tablet 100 mg (not administered)  lidocaine-EPINEPHrine (XYLOCAINE W/EPI) 2 %-1:200000 (PF) injection 10 mL (10 mLs Intradermal Given by Other 10/28/16 0503)     Initial Impression / Assessment and Plan / ED Course  I have reviewed the triage vital signs and the nursing notes.  Pertinent labs & imaging results that were available during my care of the patient were reviewed by me and considered in my medical decision making (see chart for details).  Clinical Course    Abdominal x-ray was done to look at her constipation. I'm going to do a needle aspirate of the area.  We discussed treatment of her constipation. This is most likely from the pain medication she's been taking. She states she's only taking it once a day. She was advised she could take over-the-counter ibuprofen for pain also which should not make her have constipation. She should keep her appointment later today with the oncologist and with the surgeon tomorrow as previously scheduled.  Patient was started on doxycycline in case she is trying to get a superficial cellulitis or wound infection.  Review of her labs shows on December 1 her BUN was 10 and her creatinine was 0.71.  Final Clinical Impressions(s) / ED Diagnoses   Final diagnoses:  Post-op pain  Drug-induced constipation  Cellulitis of left axilla    New Prescriptions New Prescriptions   DOXYCYCLINE (VIBRAMYCIN) 100 MG CAPSULE    Take 1 capsule (100 mg total) by mouth 2  (two) times daily.    Plan discharge  Rolland Porter, MD, Barbette Or, MD 10/28/16 [id number]

## 2016-10-28 NOTE — ED Triage Notes (Signed)
Pt c/o pain to her left breast; pt had biopsy last week and is not c/o pain; pt has a follow up appointment in the am; cbg 99

## 2016-10-28 NOTE — Progress Notes (Signed)
PROGRESS NOTE  [place], Evelyn Baird, Evelyn [address] / [place] [id number]   DIAGNOSIS: No matching staging information was found for the patient.  SUMMARY OF ONCOLOGIC HISTORY:   Diffuse large B cell lymphoma ([place])   10/13/2016 Imaging    CT CAP- No evidence of pulmonary embolism.  No active lung disease.  Short segment circumferential wall thickening involving the transverse colon, consistent with recently diagnosed colon carcinoma. 1.2 cm pericolonic lymph node, suspicious for local lymph node metastasis.  Several small low-attenuation masses in right hepatic lobe measuring up to 1.2 cm. These are suspicious for liver metastases. Abdomen MRI without and with contrast recommended for further characterization.  Increased bilateral axillary, subpectoral, and mediastinal lymphadenopathy. This is atypical for metastatic colon carcinoma, and lymphoma cannot be excluded. Consider percutaneous needle biopsy of left axillary lymphadenopathy for tissue diagnosis.  Ill-defined abnormal soft tissue density in the presacral region and along the left pelvic sidewall. Differential diagnosis includes lymphoma and metastatic disease.      10/23/2016 Procedure    Lymph node biopsy by Dr. Arnoldo Morale.      10/25/2016 Pathology Results    Diagnosis Lymph node for lymphoma - DIFFUSE LARGE B-CELL LYMPHOMA.      10/25/2016 Pathology Results    Interpretation Tissue-Flow Cytometry - MONOCLONAL B CELL POPULATION IDENTIFIED. Diagnosis Comment: The findings are consistent with Non-Hodgkin's B cell lymphoma. (BNS:gt, 10/25/16)       Adenocarcinoma of colon ([place])   07/19/2016 Procedure    Colonoscopy by Dr. Laural Golden      07/22/2016 Pathology Results    Diagnosis 1. Colon, biopsy, transverse - INVASIVE ADENOCARCINOMA. 2. Colon, polyp(s), sigmoid - TUBULAR ADENOMA. - NO HIGH GRADE DYSPLASIA OR MALIGNANCY.       CURRENT THERAPY: none  INTERVAL HISTORY: Evelyn Baird 82  y.o. female returns for iron deficiency anemia, colon cancer, and Non-Hodgkin's, Diffuse large B cell lymphoma..   Evelyn Baird presents today accompanied by her daughter and grandson. She presents in a wheelchair. I have personally reviewed the labs and scans with the patient.  We reviewed pathology, results are detailed below.  She had her colonoscopy on 07/19/2016 which showed a mass in her transverse colon. That was biopsied and was found to be an invasive adnocarcinoma. Surgical resection was organized and scheduled, but patient opted out because of her age. She says she's been so sick, she didn't want to expose herself to something like treatment or surgery.    Her daughter ultimately got her to come to see medical oncology. She saw Dr. Dwaine Deter in consultation. Imaging and PE noted enlarged peripheral adenopathy. She has undergone biopsy with  pathology report showing Non-Hodgkin's, Diffuse large B cell lymphoma.  She does not have an official power of attorney on paper yet, but her youngest daughter has been handling everything for her. She will start this paperwork soon.   Her daughter has seen a fast decline in her mother within the past 2-3 weeks. She has been getting weaker and weaker. Appetite is described as very poor.   She has been progressively losing her appetite. She is very tired and sometimes doesn't get dressed for the day. She lives alone, but her children help her. Her daughter says she refuses to leave her house.   She has lost 20 lbs since her colonoscopy in September.   She denies blood in stool.  Patient is not very talkative, but denies night sweats or fever.  She is completely non-committing in  regards to doing any therapy or talking about therapy.   MEDICAL HISTORY: Past Medical History:  Diagnosis Date  . Adenocarcinoma of colon ([place]) 11/05/2016  . Anemia   . Coronary atherosclerosis of native coronary artery    Nonobstructive 2009 - previously followed by [facility]   . Diabetes mellitus without complication ([place])   . Essential hypertension, benign   . GERD (gastroesophageal reflux disease)   . Hyperlipidemia   . Hypertension   . Peripheral neuropathy ([place])   . Prediabetes   . PVC's (premature ventricular contractions)     SURGICAL HISTORY: Past Surgical History:  Procedure Laterality Date  . AXILLARY LYMPH NODE BIOPSY Left 10/23/2016   Procedure: LYMPH NODE BIOPSY, LEFT AXILLA;  Surgeon: Aviva Signs, MD;  Location: [facility] ORS;  Service: General;  Laterality: Left;  . BIOPSY  07/19/2016   Procedure: BIOPSY;  Surgeon: Rogene Houston, MD;  Location: [facility] ENDO SUITE;  Service: Endoscopy;;  colon  . COLONOSCOPY N/A 07/19/2016   Procedure: COLONOSCOPY;  Surgeon: Rogene Houston, MD;  Location: [facility] ENDO SUITE;  Service: Endoscopy;  Laterality: N/A;  12:25  . HERNIA REPAIR    . Left abdominal hernia repair    . POLYPECTOMY  07/19/2016   Procedure: POLYPECTOMY;  Surgeon: Rogene Houston, MD;  Location: [facility] END[facility] SU[facility];  Service: Endoscopy;;  colon    SOCIAL HISTORY: Social History   Social History  . Marital status: Widowed    Spouse name: N/A  . Number of children: N/A  . Years of education: N/A   Occupational History  . Not on file.   Social History Main Topics  . Smoking status: Never Smoker  . Smokeless tobacco: Never Used     Comment: Never smoked  . Alcohol use No  . Drug use: No  . Sexual activity: No   Other Topics Concern  . Not on file   Social History Narrative  . No narrative on file    FAMILY HISTORY: Family History  Problem Relation Age of Onset  . Hypertension Father   . Kidney disease Father     Review of Systems  Constitutional: Positive for malaise/fatigue and weight loss (20 lbs since September).       Loss of appetite.  HENT: Negative.   Eyes: Negative.   Respiratory: Negative.   Cardiovascular: Negative.   Gastrointestinal: Negative.  Negative for blood in stool.  Genitourinary: Negative.   Musculoskeletal:  Negative.   Skin: Negative.   Neurological: Positive for weakness.  Endo/Heme/Allergies: Negative.   Psychiatric/Behavioral: Positive for depression.  All other systems reviewed and are negative. 14 point review of systems was performed and is negative except as detailed under history of present illness and above   PHYSICAL EXAMINATION  ECOG PERFORMANCE STATUS: 3 - Symptomatic, >50% confined to bed   Vitals with BMI 10/28/2016  Height '5\' 4"'$   Weight 124 lbs  BMI 31.5  Systolic 176  Diastolic 65  Pulse 74  Respirations 18    Physical Exam  Constitutional: She is oriented to person, place, and time and well-developed, well-nourished, and in no distress.  Pt is in a wheelchair.  Wears glasses.  Pt is very thin.  Shoddy adenopathy.  HENT:  Head: Normocephalic and atraumatic.  Eyes: EOM are normal. Pupils are equal, round, and reactive to light. No scleral icterus.  Neck: Normal range of motion. Neck supple.  Cardiovascular: Normal rate, regular rhythm and normal heart sounds.   Pulmonary/Chest: Effort normal and breath sounds normal. No respiratory  distress.  Seroma present at left axillary region at site of prior biopsy  Abdominal: Soft. Bowel sounds are normal. She exhibits no distension. There is no tenderness.  Musculoskeletal: Normal range of motion.  Neurological: She is alert and oriented to person, place, and time. Gait normal.  Skin: Skin is warm and dry.  Nursing note and vitals reviewed.   LABORATORY DATA:  CBC    Component Value Date/Time   WBC 5.2 10/21/2016 1429   RBC 3.92 10/21/2016 1429   HGB 10.2 (L) 10/21/2016 1429   HCT 30.7 (L) 10/21/2016 1429   PLT 256 10/21/2016 1429   MCV 78.3 10/21/2016 1429   MCH 26.0 10/21/2016 1429   MCHC 33.2 10/21/2016 1429   RDW 16.3 (H) 10/21/2016 1429   LYMPHSABS 1.0 10/17/2016 1436   MONOABS 0.2 10/17/2016 1436   EOSABS 0.0 10/17/2016 1436   BASOSABS 0.0 10/17/2016 1436    CMP     Component Value Date/Time     NA 134 (L) 10/21/2016 1429   K 3.9 10/21/2016 1429   CL 102 10/21/2016 1429   CO2 23 10/21/2016 1429   GLUCOSE 88 10/21/2016 1429   BUN 10 10/21/2016 1429   CREATININE 0.71 10/21/2016 1429   CREATININE 0.76 05/07/2016 0930   CALCIUM 8.9 10/21/2016 1429   PROT 6.7 07/27/2016 0333   ALBUMIN 3.2 (L) 07/27/2016 0333   AST 36 07/27/2016 0333   ALT 22 07/27/2016 0333   ALKPHOS 80 07/27/2016 0333   BILITOT 0.2 (L) 07/27/2016 0333   GFRNONAA >60 10/21/2016 1429   GFRNONAA 72 05/07/2016 0930   GFRAA >60 10/21/2016 1429   GFRAA 83 05/07/2016 0930    RADIOGRAPHIC STUDIES: CLINICAL DATA:  Right-sided chest pain for 1 week. Elevated D-dimer. Newly diagnosed colon adenocarcinoma.  EXAM: CT ANGIOGRAPHY CHEST  CT ABDOMEN AND PELVIS WITH CONTRAST  TECHNIQUE: Multidetector CT imaging of the chest was performed using the standard protocol during bolus administration of intravenous contrast. Multiplanar CT image reconstructions and MIPs were obtained to evaluate the vascular anatomy. Multidetector CT imaging of the abdomen and pelvis was performed using the standard protocol during bolus administration of intravenous contrast.  CONTRAST:  100 mL Isovue 370  COMPARISON:  Chest CTA on 08/04/2015 and noncontrast [facility] CT on 05/12/2016  FINDINGS: CTA CHEST FINDINGS  Cardiovascular: Satisfactory opacification of pulmonary arteries noted, and no pulmonary emboli identified. No evidence of thoracic aortic dissection or aneurysm. Mild cardiomegaly. Aortic atherosclerosis. Coronary artery calcification.  Mediastinum/Nodes: Increase mediastinal lymphadenopathy in the subcarinal region measuring 1.6 cm on image 45/5 compared to 1.0 cm previously. No other mediastinal or hilar lymphadenopathy identified. Increased bilateral axillary and subpectoral lymphadenopathy since prior exam, with largest index lymph node in the left axilla measuring 3.3 cm short axis on image 30/5 compared to  2.0 cm previously. Index lymph node in the right axilla measures 1.2 cm on image 22/5 and is new since prior study.  Lungs/Pleura: Mild bilateral lower lobe scarring again seen. No evidence of pulmonary infiltrate or pleural effusion. Multiple small calcified granulomas are seen within the left lung, however no suspicious pulmonary nodules or masses are identified.  Musculoskeletal: No suspicious bone lesions or other significant abnormality identified.  Review of the MIP images confirms the above findings.  CT ABDOMEN and PELVIS FINDINGS  Hepatobiliary: Multiple small hypovascular masses are seen within the right hepatic lobe, measuring up to 1.2 cm. Small liver metastases cannot be excluded. Gallbladder is unremarkable.  Pancreas:  No mass or inflammatory changes.  Spleen:  Within normal limits in size and appearance.  Adrenals/Urinary Tract: No masses identified. Tiny renal cysts noted bilaterally. No evidence of hydronephrosis. Unremarkable urinary bladder.  Stomach/Bowel: Short segment circumferential wall thickening and enhancement seen involving the transverse colon, consistent with recently diagnosed colon carcinoma. No evidence of bowel obstruction.  Vascular/Lymphatic: 1.2 cm pericolonic lymph node is seen in the right anterior abdomen just superior to the transverse colon on image 28/13, suspicious for local lymph node metastasis. Other small sub-cm mesenteric and retroperitoneal lymph nodes are seen which are not pathologically enlarged.  No abdominal aortic aneurysm.  Aortic atherosclerosis.  Reproductive: Previous hysterectomy. Soft tissue stranding seen within the pelvic mesenteric fat. Ill-defined soft tissue density is seen in the presacral soft tissues and along the left pelvic sidewall. This is difficult to measure due to its ill-defined nature, but is approximately 2.5 x 5.5 cm on image 50/13. This is suspicious for lymphoma or metastatic  disease. No evidence of ascites.  Other:  None.  Musculoskeletal:  No suspicious bone lesions identified.  IMPRESSION: No evidence of pulmonary embolism.  No active lung disease.  Short segment circumferential wall thickening involving the transverse colon, consistent with recently diagnosed colon carcinoma. 1.2 cm pericolonic lymph node, suspicious for local lymph node metastasis.  Several small low-attenuation masses in right hepatic lobe measuring up to 1.2 cm. These are suspicious for liver metastases. Abdomen MRI without and with contrast recommended for further characterization.  Increased bilateral axillary, subpectoral, and mediastinal lymphadenopathy. This is atypical for metastatic colon carcinoma, and lymphoma cannot be excluded. Consider percutaneous needle biopsy of left axillary lymphadenopathy for tissue diagnosis.  Ill-defined abnormal soft tissue density in the presacral region and along the left pelvic sidewall. Differential diagnosis includes lymphoma and metastatic disease.   Electronically Signed   By: Earle Gell M.D.   On: 10/13/2016 18:01   PATHOLOGY:         ASSESSMENT and THERAPY PLAN:  Anemia Liver metastases Colon Cancer, adenocarcinoma transverse colon,  Refused surgical resection in Sept 2017 and therefore no further management has taken place. Non-Hodgkin's, Diffuse large B cell lymphoma, at least stage III Declining PS Weakness Goals of care discussion  She has two malignancies. I explained to the patient and her daughter that both are clearly life threatening. She has several small hepatic lesions that have not been fully evaluated. They could be mets from her adenocarcinoma, she could therefore have stage IV CRC. I explained that the "most life threatening disease at this point" is her DLBCL, We discussed multiple scenarios. My preference is hospice given that the patient has previously refused any medical therapy for  her previously diagnosed CRC. The patient is also not very engaged in the conversation today.  I will set her up to fill out all of her power of attorney paperwork. I advised her daughter they just need to call us and we can assist.   We discussed her options, such as hospice and palliative treatment. She would like to get her family together and decide what she would like to do.   I will give them information to read about her disease, today.   Nurse Navigator, Anderson Malta, has met with the patient and her family in attendance today.  She has offered the patient and family her services and they will contact her with the patient's decision moving forward with regards to palliative systemic chemotherapy versus Hospice intervention.  She will return for a follow up after she decides which treatment plan she would like  to pursue. I explained that I anticipate that without intervention for her lymphoma soon she will continue to have a significant and likely rapid decline. Goals of care were discussed.  A total of 45 minutes was spent in direct patient consultation, examination and advisement.  All questions were answered. The patient knows to call the clinic with any problems, questions or concerns. We can certainly see the patient much sooner if necessary.  This document serves as a record of services personally performed by Ancil Linsey, MD. It was created on her behalf by Martinique Casey, a trained medical scribe. The creation of this record is based on the scribe's personal observations and the provider's statements to them. This document has been checked and approved by the attending provider.  I have reviewed the above documentation for accuracy and completeness and I agree with the above.  This note was electronically signed.  Patrici Ranks , MD 11/05/2016

## 2016-10-28 NOTE — Patient Instructions (Signed)
[place] at [facility] Discharge Instructions  RECOMMENDATIONS MADE BY THE CONSULTANT AND ANY TEST RESULTS WILL BE SENT TO YOUR REFERRING PHYSICIAN.  You saw Dr.Penland today. Family to call Anderson Malta, Nurse Navigator. See Amy at checkout for appointments.  Thank you for choosing [place] at [facility] to provide your oncology and hematology care.  To afford each patient quality time with our provider, please arrive at least 15 minutes before your scheduled appointment time.   Beginning January 23rd 2017 lab work for the [facility] will be done in the  Main lab at [facility] on 1st floor. If you have a lab appointment with the [facility] please come in thru the  Main Entrance and check in at the main information desk  You need to re-schedule your appointment should you arrive 10 or more minutes late.  We strive to give you quality time with our providers, and arriving late affects you and other patients whose appointments are after yours.  Also, if you no show three or more times for appointments you may be dismissed from the clinic at the providers discretion.     Again, thank you for choosing [facility].  Our hope is that these requests will decrease the amount of time that you wait before being seen by our physicians.       _____________________________________________________________  Should you have questions after your visit to [facility], please contact our office at [phone] between the hours of 8:30 a.m. and 4:30 p.m.  Voicemails left after 4:30 p.m. will not be returned until the following business day.  For prescription refill requests, have your pharmacy contact our office.         Resources For Cancer Patients and their Caregivers ? American Cancer Society: Can assist with transportation, wigs, general needs, runs Look Good Feel Better.        636-156-5986 ? Cancer Care: Provides  financial assistance, online support groups, medication/co-pay assistance.  1-800-813-HOPE 220-786-9513) ? [place] Assists [place] cancer patients and their families through emotional , educational and financial support.  (636) 345-0850 ? [place] DSS Where to apply for food stamps, Medicaid and utility assistance. 402-533-4916 ? [employer]: Transportation to medical appointments. 971-558-6858 ? Social Security Administration: May apply for disability if have a Stage IV cancer. 404-408-2206 9106961819 ? [employer], Disability and Transit Services: Assists with nutrition, care and transit needs. [place] Support Programs: @10RELATIVEDAYS @ > Cancer Support Group  2nd Tuesday of the month 1pm-2pm, Journey Room  > Creative Journey  3rd Tuesday of the month 1130am-1pm, Journey Room  > Look Good Feel Better  1st Wednesday of the month 10am-12 noon, Journey Room (Call [facility] to register [phone])

## 2016-10-29 ENCOUNTER — Encounter (HOSPITAL_COMMUNITY): Payer: Self-pay | Admitting: Hematology & Oncology

## 2016-11-02 LAB — AEROBIC/ANAEROBIC CULTURE W GRAM STAIN (SURGICAL/DEEP WOUND)

## 2016-11-02 LAB — AEROBIC/ANAEROBIC CULTURE (SURGICAL/DEEP WOUND): CULTURE: NO GROWTH

## 2016-11-05 ENCOUNTER — Encounter (HOSPITAL_COMMUNITY): Payer: Self-pay | Admitting: Hematology & Oncology

## 2016-11-05 DIAGNOSIS — C8330 Diffuse large B-cell lymphoma, unspecified site: Secondary | ICD-10-CM | POA: Insufficient documentation

## 2016-11-05 DIAGNOSIS — C189 Malignant neoplasm of colon, unspecified: Secondary | ICD-10-CM

## 2016-11-05 HISTORY — DX: Malignant neoplasm of colon, unspecified: C18.9

## 2016-11-06 ENCOUNTER — Telehealth (HOSPITAL_COMMUNITY): Payer: Self-pay | Admitting: Emergency Medicine

## 2016-11-06 ENCOUNTER — Other Ambulatory Visit (HOSPITAL_COMMUNITY): Payer: Self-pay | Admitting: Emergency Medicine

## 2016-11-06 MED ORDER — POLYETHYLENE GLYCOL 3350 17 G PO PACK: 17.0000 g | PACK | Freq: Two times a day (BID) | ORAL | 1 refills | Status: AC

## 2016-11-06 NOTE — Telephone Encounter (Signed)
Eloise called and stated that Rheya wanted to go with hospice.  Pt also wanted to be at the [facility].  I had spoke with her social worker Jenny Reichmann [phone] and she said that she would take care of the nursing home placement and I would take care of the hospice referral.  Eloise said that the iron was binding her up a bit so I called her in some miralax to take 1-2 times a day as needed.  Hospice referral to [facility] and fax confirmed.

## 2016-11-07 ENCOUNTER — Telehealth: Payer: Self-pay | Admitting: Family Medicine

## 2016-11-07 NOTE — Telephone Encounter (Signed)
Returned [employer] (admission RN per [facility]) call. No answer. Left vmail.

## 2016-11-07 NOTE — Telephone Encounter (Signed)
Beth from hospice calling regarding this patient please call her at [phone] extention [phone]

## 2016-11-08 NOTE — Telephone Encounter (Signed)
Call placed to [facility].   Reports that referral has been placed by [place] and patient has been admitted to care under Dx: colon cancer/ non-Hodkin's Lymphoma.   Inquired as to if PCP would remain MD. VO given.

## 2016-11-12 NOTE — Telephone Encounter (Signed)
Verbal order previously given on 11/08/2016.  Forwarded to MD for FYI.

## 2016-11-12 NOTE — Telephone Encounter (Signed)
I am sure Dr. Keturah Barre will remain as MD.

## 2016-11-13 ENCOUNTER — Telehealth: Payer: Self-pay | Admitting: Family Medicine

## 2016-11-13 ENCOUNTER — Other Ambulatory Visit (HOSPITAL_COMMUNITY): Payer: Self-pay | Admitting: *Deleted

## 2016-11-13 NOTE — Telephone Encounter (Signed)
Patients daughter eloise calling to see if ms [place] still needs a hospital f/u even though hospice was called in  [phone]

## 2016-11-13 NOTE — Telephone Encounter (Signed)
No need for hospital F/U with hospice.   Call placed to patient and patient daughter Janeice Robinson made aware.

## 2016-11-19 ENCOUNTER — Telehealth: Payer: Self-pay | Admitting: *Deleted

## 2016-11-19 NOTE — Telephone Encounter (Signed)
Received call from [place], Hospice SN with [facility].   Reports that patient is having cough and requested order for [facility] Cough Syrup.   MD made aware and approved.

## 2016-12-02 ENCOUNTER — Emergency Department (HOSPITAL_COMMUNITY)

## 2016-12-02 ENCOUNTER — Other Ambulatory Visit: Payer: Self-pay

## 2016-12-02 ENCOUNTER — Inpatient Hospital Stay (HOSPITAL_COMMUNITY)
Admission: EM | Admit: 2016-12-02 | Discharge: 2016-12-12 | DRG: 176 | Disposition: E | Attending: Internal Medicine | Admitting: Internal Medicine

## 2016-12-02 ENCOUNTER — Encounter (HOSPITAL_COMMUNITY): Payer: Self-pay | Admitting: Emergency Medicine

## 2016-12-02 DIAGNOSIS — I359 Nonrheumatic aortic valve disorder, unspecified: Secondary | ICD-10-CM | POA: Diagnosis not present

## 2016-12-02 DIAGNOSIS — D509 Iron deficiency anemia, unspecified: Secondary | ICD-10-CM | POA: Diagnosis present

## 2016-12-02 DIAGNOSIS — C189 Malignant neoplasm of colon, unspecified: Secondary | ICD-10-CM | POA: Diagnosis present

## 2016-12-02 DIAGNOSIS — Z8249 Family history of ischemic heart disease and other diseases of the circulatory system: Secondary | ICD-10-CM

## 2016-12-02 DIAGNOSIS — I2699 Other pulmonary embolism without acute cor pulmonale: Secondary | ICD-10-CM | POA: Diagnosis not present

## 2016-12-02 DIAGNOSIS — I10 Essential (primary) hypertension: Secondary | ICD-10-CM | POA: Diagnosis present

## 2016-12-02 DIAGNOSIS — Z7982 Long term (current) use of aspirin: Secondary | ICD-10-CM

## 2016-12-02 DIAGNOSIS — C8330 Diffuse large B-cell lymphoma, unspecified site: Secondary | ICD-10-CM | POA: Diagnosis present

## 2016-12-02 DIAGNOSIS — Z9889 Other specified postprocedural states: Secondary | ICD-10-CM

## 2016-12-02 DIAGNOSIS — E785 Hyperlipidemia, unspecified: Secondary | ICD-10-CM | POA: Diagnosis present

## 2016-12-02 DIAGNOSIS — R109 Unspecified abdominal pain: Secondary | ICD-10-CM | POA: Diagnosis present

## 2016-12-02 DIAGNOSIS — C799 Secondary malignant neoplasm of unspecified site: Secondary | ICD-10-CM

## 2016-12-02 DIAGNOSIS — I2510 Atherosclerotic heart disease of native coronary artery without angina pectoris: Secondary | ICD-10-CM | POA: Diagnosis present

## 2016-12-02 DIAGNOSIS — Z66 Do not resuscitate: Secondary | ICD-10-CM | POA: Diagnosis present

## 2016-12-02 DIAGNOSIS — R0602 Shortness of breath: Secondary | ICD-10-CM

## 2016-12-02 DIAGNOSIS — R7989 Other specified abnormal findings of blood chemistry: Secondary | ICD-10-CM

## 2016-12-02 DIAGNOSIS — R778 Other specified abnormalities of plasma proteins: Secondary | ICD-10-CM

## 2016-12-02 DIAGNOSIS — I493 Ventricular premature depolarization: Secondary | ICD-10-CM | POA: Diagnosis present

## 2016-12-02 DIAGNOSIS — R748 Abnormal levels of other serum enzymes: Secondary | ICD-10-CM | POA: Diagnosis not present

## 2016-12-02 DIAGNOSIS — K219 Gastro-esophageal reflux disease without esophagitis: Secondary | ICD-10-CM | POA: Diagnosis present

## 2016-12-02 DIAGNOSIS — Z841 Family history of disorders of kidney and ureter: Secondary | ICD-10-CM

## 2016-12-02 DIAGNOSIS — R079 Chest pain, unspecified: Secondary | ICD-10-CM | POA: Diagnosis present

## 2016-12-02 DIAGNOSIS — Z79899 Other long term (current) drug therapy: Secondary | ICD-10-CM

## 2016-12-02 DIAGNOSIS — Z515 Encounter for palliative care: Secondary | ICD-10-CM | POA: Diagnosis present

## 2016-12-02 DIAGNOSIS — E119 Type 2 diabetes mellitus without complications: Secondary | ICD-10-CM | POA: Diagnosis present

## 2016-12-02 HISTORY — DX: Diffuse large B-cell lymphoma, unspecified site: C83.30

## 2016-12-02 LAB — URINALYSIS, ROUTINE W REFLEX MICROSCOPIC
Bilirubin Urine: NEGATIVE
GLUCOSE, UA: NEGATIVE mg/dL
Hgb urine dipstick: NEGATIVE
KETONES UR: NEGATIVE mg/dL
LEUKOCYTES UA: NEGATIVE
Nitrite: NEGATIVE
PH: 5 (ref 5.0–8.0)
PROTEIN: 30 mg/dL — AB
Specific Gravity, Urine: 1.014 (ref 1.005–1.030)

## 2016-12-02 LAB — PROTIME-INR
INR: 1.02
Prothrombin Time: 13.4 seconds (ref 11.4–15.2)

## 2016-12-02 LAB — CBC WITH DIFFERENTIAL/PLATELET
Basophils Absolute: 0.3 10*3/uL — ABNORMAL HIGH (ref 0.0–0.1)
Basophils Relative: 4 %
Eosinophils Absolute: 0 10*3/uL (ref 0.0–0.7)
Eosinophils Relative: 0 %
HEMATOCRIT: 27 % — AB (ref 36.0–46.0)
Hemoglobin: 9.7 g/dL — ABNORMAL LOW (ref 12.0–15.0)
LYMPHS ABS: 3.7 10*3/uL (ref 0.7–4.0)
LYMPHS PCT: 44 %
MCH: 28.1 pg (ref 26.0–34.0)
MCHC: 35.9 g/dL (ref 30.0–36.0)
MCV: 78.3 fL (ref 78.0–100.0)
MONOS PCT: 7 %
Monocytes Absolute: 0.6 10*3/uL (ref 0.1–1.0)
NEUTROS ABS: 3.9 10*3/uL (ref 1.7–7.7)
NEUTROS PCT: 46 %
Platelets: 167 10*3/uL (ref 150–400)
RBC: 3.45 MIL/uL — AB (ref 3.87–5.11)
RDW: 21.6 % — AB (ref 11.5–15.5)
WBC: 7.8 10*3/uL (ref 4.0–10.5)

## 2016-12-02 LAB — COMPREHENSIVE METABOLIC PANEL
ALBUMIN: 3.4 g/dL — AB (ref 3.5–5.0)
ALT: 32 U/L (ref 14–54)
AST: 42 U/L — AB (ref 15–41)
Alkaline Phosphatase: 102 U/L (ref 38–126)
Anion gap: 9 (ref 5–15)
CHLORIDE: 103 mmol/L (ref 101–111)
CO2: 23 mmol/L (ref 22–32)
CREATININE: 0.66 mg/dL (ref 0.44–1.00)
Calcium: 9.1 mg/dL (ref 8.9–10.3)
GFR calc Af Amer: >60 mL/min (ref >60)
GLUCOSE: 114 mg/dL — AB (ref 65–99)
POTASSIUM: 3.2 mmol/L — AB (ref 3.5–5.1)
Sodium: 135 mmol/L (ref 135–145)
Total Bilirubin: 0.4 mg/dL (ref 0.3–1.2)
Total Protein: 9.2 g/dL — ABNORMAL HIGH (ref 6.5–8.1)

## 2016-12-02 LAB — TROPONIN I
Troponin I: 0.03 ng/mL (ref <0.03)
Troponin I: 0.04 ng/mL (ref <0.03)

## 2016-12-02 LAB — LACTIC ACID, PLASMA: Lactic Acid, Venous: 1.5 mmol/L (ref 0.5–1.9)

## 2016-12-02 LAB — LIPASE, BLOOD: Lipase: <10 U/L — ABNORMAL LOW (ref 11–51)

## 2016-12-02 MED ORDER — MORPHINE SULFATE (PF) 2 MG/ML IV SOLN
2.0000 mg | INTRAVENOUS | Status: DC | PRN
  Administered 2016-12-02: 2 mg via INTRAVENOUS
  Filled 2016-12-02: qty 1

## 2016-12-02 MED ORDER — ONDANSETRON HCL 4 MG/2ML IJ SOLN
4.0000 mg | INTRAMUSCULAR | Status: DC | PRN
  Administered 2016-12-02: 4 mg via INTRAVENOUS
  Filled 2016-12-02: qty 2

## 2016-12-02 MED ORDER — IOPAMIDOL (ISOVUE-300) INJECTION 61%
INTRAVENOUS | Status: AC
  Administered 2016-12-02: 30 mL
  Filled 2016-12-02: qty 30

## 2016-12-02 MED ORDER — IOPAMIDOL (ISOVUE-370) INJECTION 76%
100.0000 mL | Freq: Once | INTRAVENOUS | Status: AC | PRN
  Administered 2016-12-02: 100 mL via INTRAVENOUS

## 2016-12-02 MED ORDER — ENOXAPARIN SODIUM 60 MG/0.6ML ~~LOC~~ SOLN
1.0000 mg/kg | Freq: Two times a day (BID) | SUBCUTANEOUS | Status: DC
  Administered 2016-12-02 – 2016-12-04 (×4): 50 mg via SUBCUTANEOUS
  Filled 2016-12-02 (×4): qty 0.6

## 2016-12-02 NOTE — ED Notes (Signed)
Pt denies need for N/ or pain medications at this time.

## 2016-12-02 NOTE — Progress Notes (Signed)
ANTICOAGULATION CONSULT NOTE - Initial Consult  Pharmacy Consult for Lovenox Indication: pulmonary embolus  Allergies  Allergen Reactions  . Beef-Derived Products Itching and Nausea And Vomiting  . Pork-Derived Products Itching and Nausea And Vomiting   Patient Measurements: Height: 5\' 5"  (165.1 cm) Weight: 112 lb (50.8 kg) IBW/kg (Calculated) : 57 HEPARIN DW (KG): 50.8    Vital Signs: Temp: 98 F (36.7 C) (01/22 1930) Temp Source: Oral (01/22 1930) BP: 148/98 (01/22 2200) Pulse Rate: 84 (01/22 2200)  Labs:  Recent Labs  11/12/2016 1821 12/04/2016 2019  HGB 9.7*  --   HCT 27.0*  --   PLT 167  --   CREATININE 0.66  --   TROPONINI 0.03* 0.04*    Estimated Creatinine Clearance: 42 mL/min (by C-G formula based on SCr of 0.66 mg/dL).   Medical History: Past Medical History:  Diagnosis Date  . Adenocarcinoma of colon ([place]) 11/05/2016  . Anemia   . Coronary atherosclerosis of native coronary artery    Nonobstructive 2009 - previously followed by [facility]  . Diabetes mellitus without complication ([place])   . Diffuse large B-cell lymphoma ([facility])    Non-Hodgkin's  . Essential hypertension, benign   . GERD (gastroesophageal reflux disease)   . Hyperlipidemia   . Hypertension   . Peripheral neuropathy ([place])   . Prediabetes   . PVC's (premature ventricular contractions)     Medications:   (Not in a hospital admission) Home meds reviewed.  Assessment: Okay for Protocol, treatment for PE.  CT (+) PE and potential metastatic disease.  Baseline anticoag labs pending.  Goal of Therapy:  Systemic Anticoagulation. Monitor platelets by anticoagulation protocol: Yes   Plan:  Lovenox 1mg /kg SQ every 12 hours. Monitor for signs and symptoms of bleeding.  Biagio Quint R 11/12/2016,10:37 PM

## 2016-12-02 NOTE — ED Provider Notes (Signed)
[facility] DEPT Provider Note   CSN: [id number] Arrival date & time: 12/05/2016  1804     History   Chief Complaint Chief Complaint  Patient presents with  . Chest Pain  . Abdominal Pain    HPI Evelyn Baird is a 83 y.o. female.  HPI Pt was seen at [facility].  Per pt, c/o gradual onset and worsening of persistent left sided chest and abd "pain" for the past several days. States the pain started intermittently, but became constant today at 0500. Has been associated with constipation and SOB. Describes the abd pain as "cramping" and "aching."  Denies N/V, no diarrhea, no fevers, no back pain, no rash, no CP/SOB, no black or blood in stools.      Past Medical History:  Diagnosis Date  . Adenocarcinoma of colon ([place]) 11/05/2016  . Anemia   . Coronary atherosclerosis of native coronary artery    Nonobstructive 2009 - previously followed by [facility]  . Diabetes mellitus without complication ([place])   . Diffuse large B-cell lymphoma ([facility])    Non-Hodgkin's  . Essential hypertension, benign   . GERD (gastroesophageal reflux disease)   . Hyperlipidemia   . Hypertension   . Peripheral neuropathy ([place])   . Prediabetes   . PVC's (premature ventricular contractions)     Patient Active Problem List   Diagnosis Date Noted  . Diffuse large B cell lymphoma ([place]) 11/05/2016  . Adenocarcinoma of colon ([place]) 11/05/2016  . Hypotension 09/16/2016  . CVA (cerebral infarction) 08/01/2016  . History of cerebellar stroke 08/01/2016  . IDA (iron deficiency anemia) 06/18/2016  . Guaiac + stool 06/18/2016  . Peripheral edema 04/30/2016  . Esophageal dysmotility 06/20/2015  . Dysphagia 05/09/2015  . Anemia 03/23/2014  . Hereditary and idiopathic peripheral neuropathy 12/06/2013  . Aortic valve disorders 04/03/2012  . Hyperlipidemia 10/15/2006  . Essential hypertension, benign 10/15/2006  . Coronary atherosclerosis of native coronary artery 10/15/2006  . GERD 10/15/2006    Past Surgical History:   Procedure Laterality Date  . AXILLARY LYMPH NODE BIOPSY Left 10/23/2016   Procedure: LYMPH NODE BIOPSY, LEFT AXILLA;  Surgeon: Aviva Signs, MD;  Location: [facility] ORS;  Service: General;  Laterality: Left;  . BIOPSY  07/19/2016   Procedure: BIOPSY;  Surgeon: Rogene Houston, MD;  Location: [facility] ENDO SUITE;  Service: Endoscopy;;  colon  . COLONOSCOPY N/A 07/19/2016   Procedure: COLONOSCOPY;  Surgeon: Rogene Houston, MD;  Location: [facility] END[facility] SUITE;  Service: Endoscopy;  Laterality: N/A;  12:25  . HERNIA REPAIR    . Left abdominal hernia repair    . POLYPECTOMY  07/19/2016   Procedure: POLYPECTOMY;  Surgeon: Rogene Houston, MD;  Location: [facility] END[facility] SU[facility];  Service: Endoscopy;;  colon    OB History    Gravida Para Term Preterm AB Living   7 7 7     6    SAB TAB Ectopic Multiple Live Births                   Home Medications    Prior to Admission medications   Medication Sig Start Date End Date Taking? Authorizing Provider  aspirin 81 MG chewable tablet Chew 1 tablet (81 mg total) by mouth daily. 08/03/16   Kathie Dike, MD  atorvastatin (LIPITOR) 20 MG tablet Take 1 tablet (20 mg total) by mouth at bedtime. FOR CHOLESTEROL 09/16/16   Alycia Rossetti, MD  doxycycline (VIBRAMYCIN) 100 MG capsule Take 1 capsule (100 mg total) by mouth 2 (two)  times daily. 10/28/16   Rolland Porter, MD  ferrous sulfate 325 (65 FE) MG EC tablet Take 1 tablet (325 mg total) by mouth daily. 10/17/16   Forest Gleason, MD  HYDROcodone-acetaminophen (NORCO) 5-325 MG tablet Take 1 tablet by mouth every 6 (six) hours as needed for moderate pain. 10/23/16   Aviva Signs, MD  meclizine (ANTIVERT) 25 MG tablet Take 1 tablet (25 mg total) by mouth 4 (four) times daily as needed for dizziness. 07/27/16   Rolland Porter, MD  NITROSTAT 0.4 MG SL tablet DISSOLVE (1) TABLET UNDER TONGUE AS NEEDED TO RELIEVE CHEST PAIN. MAYREPEAT EVERY 5 MINUTES. 05/28/16   Lendon Colonel, NP  pantoprazole (PROTONIX) 40 MG tablet Take 1 tablet (40 mg total) by  mouth daily. 09/16/16   Alycia Rossetti, MD  polyethylene glycol [facility] Floria Raveling) packet Take 17 g by mouth 2 (two) times daily. 11/06/16   Patrici Ranks, MD    Family History Family History  Problem Relation Age of Onset  . Hypertension Father   . Kidney disease Father     Social History Social History  Substance Use Topics  . Smoking status: Never Smoker  . Smokeless tobacco: Never Used     Comment: Never smoked  . Alcohol use No     Allergies   Beef-derived products and Pork-derived products   Review of Systems Review of Systems ROS: Statement: All systems negative except as marked or noted in the HPI; Constitutional: Negative for fever and chills. ; ; Eyes: Negative for eye pain, redness and discharge. ; ; ENMT: Negative for ear pain, hoarseness, nasal congestion, sinus pressure and sore throat. ; ; Cardiovascular: +CP, SOB. Negative for palpitations, diaphoresis, and peripheral edema. ; ; Respiratory: Negative for cough, wheezing and stridor. ; ; Gastrointestinal: +abd pain. Negative for nausea, vomiting, diarrhea, blood in stool, hematemesis, jaundice and rectal bleeding. . ; ; Genitourinary: Negative for dysuria, flank pain and hematuria. ; ; Musculoskeletal: Negative for back pain and neck pain. Negative for swelling and trauma.; ; Skin: Negative for pruritus, rash, abrasions, blisters, bruising and skin lesion.; ; Neuro: Negative for headache, lightheadedness and neck stiffness. Negative for weakness, altered level of consciousness, altered mental status, extremity weakness, paresthesias, involuntary movement, seizure and syncope.       Physical Exam Updated Vital Signs Ht 5\' 5"  (1.651 m)   Wt 112 lb (50.8 kg)   BMI 18.64 kg/m   Physical Exam 1810: Physical examination:  Nursing notes reviewed; Vital signs and O2 SAT reviewed;  Constitutional: Well developed, Well nourished, Well hydrated, In no acute distress; Head:  Normocephalic, atraumatic; Eyes: EOMI,  PERRL, No scleral icterus; ENMT: Mouth and pharynx normal, Mucous membranes moist; Neck: Supple, Full range of motion, No lymphadenopathy; Cardiovascular: Regular rate and rhythm, No gallop; Respiratory: Breath sounds clear & equal bilaterally, No wheezes. Speaking full sentences with ease, Normal respiratory effort/excursion; Chest: +TTP left lateral chest wall. No rash, no soft tissue crepitus. Movement normal; Abdomen: Soft, +LUQ, LLQ > diffuse tenderness to palp. No rebound or guarding. Nondistended, Normal bowel sounds; Genitourinary: No CVA tenderness; Extremities: Pulses normal, No tenderness, No edema, No calf edema or asymmetry.; Neuro: AA&Ox3, Major CN grossly intact.  Speech clear. No gross focal motor or sensory deficits in extremities.; Skin: Color normal, Warm, Dry.   ED Treatments / Results  Labs (all labs ordered are listed, but only abnormal results are displayed)   EKG  EKG Interpretation None        Radiology  Procedures Procedures (including critical care time)  Medications Ordered in ED Medications  morphine 2 MG/ML injection 2 mg (not administered)  ondansetron (ZOFRAN) injection 4 mg (not administered)     Initial Impression / Assessment and Plan / ED Course  I have reviewed the triage vital signs and the nursing notes.  Pertinent labs & imaging results that were available during my care of the patient were reviewed by me and considered in my medical decision making (see chart for details).  MDM Reviewed: previous chart, nursing note and vitals Reviewed previous: labs and ECG Interpretation: labs, ECG and CT scan Total time providing critical care: 30-74 minutes. This excludes time spent performing separately reportable procedures and services. Consults: admitting MD   CRITICAL CARE Performed by: Alfonzo Feller Total critical care time: 35 minutes Critical care time was exclusive of separately billable procedures and treating other  patients. Critical care was necessary to treat or prevent imminent or life-threatening deterioration. Critical care was time spent personally by me on the following activities: development of treatment plan with patient and/or surrogate as well as nursing, discussions with consultants, evaluation of patient's response to treatment, examination of patient, obtaining history from patient or surrogate, ordering and performing treatments and interventions, ordering and review of laboratory studies, ordering and review of radiographic studies, pulse oximetry and re-evaluation of patient's condition.  ED ECG REPORT   Date: 12/01/2016  Rate: 96  Rhythm: normal sinus rhythm  QRS Axis: left  Intervals: normal  ST/T Wave abnormalities: nonspecific ST/T changes  Conduction Disutrbances:none  Narrative Interpretation:   Old EKG Reviewed: changes noted; NS STTW changes now present compared to previous EKG dated 10/22/2016  I have personally reviewed the EKG tracing and agree with the computerized printout as noted.   Results for orders placed or performed during the hospital encounter of 12/07/2016  Urinalysis, Routine w reflex microscopic  Result Value Ref Range   Color, Urine YELLOW YELLOW   APPearance CLEAR CLEAR   Specific Gravity, Urine 1.014 1.005 - 1.030   pH 5.0 5.0 - 8.0   Glucose, UA NEGATIVE NEGATIVE mg/dL   Hgb urine dipstick NEGATIVE NEGATIVE   Bilirubin Urine NEGATIVE NEGATIVE   Ketones, ur NEGATIVE NEGATIVE mg/dL   Protein, ur 30 (A) NEGATIVE mg/dL   Nitrite NEGATIVE NEGATIVE   Leukocytes, UA NEGATIVE NEGATIVE   RBC / HPF 0-5 0 - 5 RBC/hpf   WBC, UA 0-5 0 - 5 WBC/hpf   Bacteria, UA RARE (A) NONE SEEN   Hyaline Casts, UA PRESENT   Comprehensive metabolic panel  Result Value Ref Range   Sodium 135 135 - 145 mmol/L   Potassium 3.2 (L) 3.5 - 5.1 mmol/L   Chloride 103 101 - 111 mmol/L   CO2 23 22 - 32 mmol/L   Glucose, Bld 114 (H) 65 - 99 mg/dL   BUN <5 (L) 6 - 20 mg/dL    Creatinine, Ser 0.66 0.44 - 1.00 mg/dL   Calcium 9.1 8.9 - 10.3 mg/dL   Total Protein 9.2 (H) 6.5 - 8.1 g/dL   Albumin 3.4 (L) 3.5 - 5.0 g/dL   AST 42 (H) 15 - 41 U/L   ALT 32 14 - 54 U/L   Alkaline Phosphatase 102 38 - 126 U/L   Total Bilirubin 0.4 0.3 - 1.2 mg/dL   GFR calc non Af Amer >60 >60 mL/min   GFR calc Af Amer >60 >60 mL/min   Anion gap 9 5 - 15  Lipase, blood  Result Value Ref  Range   Lipase <10 (L) 11 - 51 U/L  Troponin I  Result Value Ref Range   Troponin I 0.03 (HH) <0.03 ng/mL  Lactic acid, plasma  Result Value Ref Range   Lactic Acid, Venous 1.5 0.5 - 1.9 mmol/L  CBC with Differential  Result Value Ref Range   WBC 7.8 4.0 - 10.5 K/uL   RBC 3.45 (L) 3.87 - 5.11 MIL/uL   Hemoglobin 9.7 (L) 12.0 - 15.0 g/dL   HCT 27.0 (L) 36.0 - 46.0 %   MCV 78.3 78.0 - 100.0 fL   MCH 28.1 26.0 - 34.0 pg   MCHC 35.9 30.0 - 36.0 g/dL   RDW 21.6 (H) 11.5 - 15.5 %   Platelets 167 150 - 400 K/uL   Neutrophils Relative % 46 %   Neutro Abs 3.9 1.7 - 7.7 K/uL   Lymphocytes Relative 44 %   Lymphs Abs 3.7 0.7 - 4.0 K/uL   Monocytes Relative 7 %   Monocytes Absolute 0.6 0.1 - 1.0 K/uL   Eosinophils Relative 0 %   Eosinophils Absolute 0.0 0.0 - 0.7 K/uL   Basophils Relative 4 %   Basophils Absolute 0.3 (H) 0.0 - 0.1 K/uL   WBC Morphology WHITE COUNT CONFIRMED ON SMEAR    RBC Morphology POLYCHROMASIA PRESENT   Troponin I  Result Value Ref Range   Troponin I 0.04 (HH) <0.03 ng/mL   Ct Angio Chest Pe W/cm &/or Wo Cm Result Date: 11/20/2016 CLINICAL DATA:  Chest and abdominal pain.  History of colon cancer. EXAM: CT ANGIOGRAPHY CHEST CT ABDOMEN AND PELVIS WITH CONTRAST TECHNIQUE: Multidetector CT imaging of the chest was performed using the standard protocol during bolus administration of intravenous contrast. Multiplanar CT image reconstructions and MIPs were obtained to evaluate the vascular anatomy. Multidetector CT imaging of the abdomen and pelvis was performed using the standard  protocol during bolus administration of intravenous contrast. CONTRAST:  69mL ISOVUE-300 IOPAMIDOL (ISOVUE-300) INJECTION 61% COMPARISON:  10/13/2016. FINDINGS: CTA CHEST FINDINGS Cardiovascular: The heart is markedly enlarged. Coronary artery calcification is noted. No pericardial effusion. Atherosclerotic calcification is noted in the wall of the thoracic aorta. Small volume nonocclusive segmental and subsegmental pulmonary embolus identified in the right upper lobe. No other pulmonary embolic disease is identified. Mediastinum/Nodes: 1.6 cm subcarinal lymph node seen on the previous study is now 1.9 cm. There is right hilar lymphadenopathy. The esophagus has normal imaging features. Bulky left axillary lymphadenopathy persists in the 3.3 cm short axis left axillary lymph node measured previously is now 3.5 cm. Lymphadenopathy is seen in the right axilla but less advanced than on the contralateral side. Lungs/Pleura: There is bilateral lower lobe collapse/consolidation with small to moderate bilateral pleural effusions. Musculoskeletal: Bone windows reveal no worrisome lytic or sclerotic osseous lesions. Review of the MIP images confirms the above findings. CT ABDOMEN and PELVIS FINDINGS Hepatobiliary: 12 mm low-density lesion in the dome of the liver has progressed in the interval. 8 mm posterior right liver lesion has progressed. The 10 mm inferior right liver lesion measured previously is now 13 mm (image 32 series 12) and additional lesions in the inferior tip of the right liver have progressed. There is no evidence for gallstones, gallbladder wall thickening, or pericholecystic fluid. No intrahepatic or extrahepatic biliary dilation. Pancreas: No focal mass lesion. No dilatation of the main duct. No intraparenchymal cyst. No peripancreatic edema. Spleen: No splenomegaly. No focal mass lesion. Adrenals/Urinary Tract: No adrenal nodule or mass. Bilateral renal cysts are evident. No hydroureteronephrosis.  The  urinary bladder appears normal for the degree of distention. Stomach/Bowel: Stomach is nondistended. No gastric wall thickening. No evidence of outlet obstruction. Duodenum is normally positioned as is the ligament of Treitz. No small bowel wall thickening. No small bowel dilatation. The terminal ileum is normal. The appendix is not visualized, but there is no edema or inflammation in the region of the cecum. 2.6 x 3.6 cm masslike soft tissue lesion is identified adjacent to the mid transverse colon, in the region of wall thickening seen previously. Multiple omental nodules are evident. Vascular/Lymphatic: There is abdominal aortic atherosclerosis without aneurysm. Multiple omental nodules are evident. Upper normal to borderline enlarged lymph nodes are seen in the root of the small bowel mesentery and retroperitoneal space. 3.2 x 5.9 cm collection of abnormal soft tissue is identified along the posterior left pelvic sidewall extending into the presacral space. Borderline lymphadenopathy is identified in the groin regions bilaterally. Reproductive: Uterus is surgically absent. There is no adnexal mass. Other: No substantial intraperitoneal free fluid. Musculoskeletal: 15 mm lucent lesion identified left sacrum. Diffuse body wall edema is evident. Review of the MIP images confirms the above findings. IMPRESSION: 1. Small nonobstructive acute pulmonary embolus 2 segmental branch right upper lobe. 2. Bilateral lower lobe collapse/consolidation with small bilateral pleural effusions. 3. Interval progression of abnormal soft tissue associated with the transverse colon. 4. Interval progression of multiple liver lesions, highly suspicious for metastatic disease. 5. Progression of subcarinal, right hilar, and bulky left axillary lymphadenopathy. 6. Persistent abnormal soft tissue in the posterior left pelvic sidewall extending into the presacral space, as before. 7. 15 mm lucent lesion left sacrum. Metastatic involvement  not excluded. Critical Value/emergent results were called by telephone at the time of interpretation on 11/22/2016 at 10:16 pm to Dr. Francine Graven , who verbally acknowledged these results. Electronically Signed   By: Misty Stanley M.D.   On: 11/28/2016 22:16   Ct Abdomen Pelvis W Contrast Result Date: 11/21/2016 CLINICAL DATA:  Chest and abdominal pain.  History of colon cancer. EXAM: CT ANGIOGRAPHY CHEST CT ABDOMEN AND PELVIS WITH CONTRAST TECHNIQUE: Multidetector CT imaging of the chest was performed using the standard protocol during bolus administration of intravenous contrast. Multiplanar CT image reconstructions and MIPs were obtained to evaluate the vascular anatomy. Multidetector CT imaging of the abdomen and pelvis was performed using the standard protocol during bolus administration of intravenous contrast. CONTRAST:  90mL ISOVUE-300 IOPAMIDOL (ISOVUE-300) INJECTION 61% COMPARISON:  10/13/2016. FINDINGS: CTA CHEST FINDINGS Cardiovascular: The heart is markedly enlarged. Coronary artery calcification is noted. No pericardial effusion. Atherosclerotic calcification is noted in the wall of the thoracic aorta. Small volume nonocclusive segmental and subsegmental pulmonary embolus identified in the right upper lobe. No other pulmonary embolic disease is identified. Mediastinum/Nodes: 1.6 cm subcarinal lymph node seen on the previous study is now 1.9 cm. There is right hilar lymphadenopathy. The esophagus has normal imaging features. Bulky left axillary lymphadenopathy persists in the 3.3 cm short axis left axillary lymph node measured previously is now 3.5 cm. Lymphadenopathy is seen in the right axilla but less advanced than on the contralateral side. Lungs/Pleura: There is bilateral lower lobe collapse/consolidation with small to moderate bilateral pleural effusions. Musculoskeletal: Bone windows reveal no worrisome lytic or sclerotic osseous lesions. Review of the MIP images confirms the above  findings. CT ABDOMEN and PELVIS FINDINGS Hepatobiliary: 12 mm low-density lesion in the dome of the liver has progressed in the interval. 8 mm posterior right liver lesion has progressed. The 10  mm inferior right liver lesion measured previously is now 13 mm (image 32 series 12) and additional lesions in the inferior tip of the right liver have progressed. There is no evidence for gallstones, gallbladder wall thickening, or pericholecystic fluid. No intrahepatic or extrahepatic biliary dilation. Pancreas: No focal mass lesion. No dilatation of the main duct. No intraparenchymal cyst. No peripancreatic edema. Spleen: No splenomegaly. No focal mass lesion. Adrenals/Urinary Tract: No adrenal nodule or mass. Bilateral renal cysts are evident. No hydroureteronephrosis. The urinary bladder appears normal for the degree of distention. Stomach/Bowel: Stomach is nondistended. No gastric wall thickening. No evidence of outlet obstruction. Duodenum is normally positioned as is the ligament of Treitz. No small bowel wall thickening. No small bowel dilatation. The terminal ileum is normal. The appendix is not visualized, but there is no edema or inflammation in the region of the cecum. 2.6 x 3.6 cm masslike soft tissue lesion is identified adjacent to the mid transverse colon, in the region of wall thickening seen previously. Multiple omental nodules are evident. Vascular/Lymphatic: There is abdominal aortic atherosclerosis without aneurysm. Multiple omental nodules are evident. Upper normal to borderline enlarged lymph nodes are seen in the root of the small bowel mesentery and retroperitoneal space. 3.2 x 5.9 cm collection of abnormal soft tissue is identified along the posterior left pelvic sidewall extending into the presacral space. Borderline lymphadenopathy is identified in the groin regions bilaterally. Reproductive: Uterus is surgically absent. There is no adnexal mass. Other: No substantial intraperitoneal free fluid.  Musculoskeletal: 15 mm lucent lesion identified left sacrum. Diffuse body wall edema is evident. Review of the MIP images confirms the above findings. IMPRESSION: 1. Small nonobstructive acute pulmonary embolus 2 segmental branch right upper lobe. 2. Bilateral lower lobe collapse/consolidation with small bilateral pleural effusions. 3. Interval progression of abnormal soft tissue associated with the transverse colon. 4. Interval progression of multiple liver lesions, highly suspicious for metastatic disease. 5. Progression of subcarinal, right hilar, and bulky left axillary lymphadenopathy. 6. Persistent abnormal soft tissue in the posterior left pelvic sidewall extending into the presacral space, as before. 7. 15 mm lucent lesion left sacrum. Metastatic involvement not excluded. Critical Value/emergent results were called by telephone at the time of interpretation on 11/11/2016 at 10:16 pm to Dr. Francine Graven , who verbally acknowledged these results. Electronically Signed   By: Misty Stanley M.D.   On: 11/21/2016 22:16    2230:  Will start anticoagulation. EPIC chart reviewed: last Onc MD visit last month where discussion was tx vs hospice. I asked pt if she and her family came to a decision regarding her cancer (tx vs hospice) and pt said "I guess we need to still talk about that." Will not answer me directly regarding her wishes.  Dx and testing d/w pt.  Questions answered.  Verb understanding, agreeable to admit. T/C to [facility] Dr. Shanon Brow, case discussed, including:  HPI, pertinent PM/SHx, VS/PE, dx testing, ED course and treatment:  Agreeable to admit, requests to write temporary orders, obtain observation tele bed to team [facility]mits.   Final Clinical Impressions(s) / ED Diagnoses   Final diagnoses:  None    New Prescriptions New Prescriptions   No medications on file     Francine Graven, DO 12/04/16 1843

## 2016-12-02 NOTE — ED Notes (Signed)
CRITICAL VALUE ALERT  Critical value received:  Troponin 0.03  Date of notification:  11/25/2016  Time of notification:  1944  Critical value read back:yes  Nurse who received alert:  Tilden Fossa   MD notified (1st page):  Thurnell Garbe  Time of first page:  1944  MD notified (2nd page):  Time of second page:  Responding MD:  Thurnell Garbe  Time MD responded:  [phone]

## 2016-12-02 NOTE — ED Notes (Signed)
Pt unable to provide urine specimen at this time

## 2016-12-02 NOTE — ED Notes (Signed)
Pt resting with eyes closed, appears to be in no distress. Respirations are even and unlabored.  

## 2016-12-02 NOTE — ED Triage Notes (Addendum)
Pt reports abd pain and chest pain becoming constant today.  Has been intermittent for several days.  Rubbed self down with mustard and rubbing alcohol today for the pain.  Denies n/v/d.

## 2016-12-03 ENCOUNTER — Encounter (HOSPITAL_COMMUNITY): Payer: Self-pay

## 2016-12-03 DIAGNOSIS — C799 Secondary malignant neoplasm of unspecified site: Secondary | ICD-10-CM | POA: Insufficient documentation

## 2016-12-03 DIAGNOSIS — R7989 Other specified abnormal findings of blood chemistry: Secondary | ICD-10-CM

## 2016-12-03 DIAGNOSIS — R778 Other specified abnormalities of plasma proteins: Secondary | ICD-10-CM | POA: Insufficient documentation

## 2016-12-03 DIAGNOSIS — C8330 Diffuse large B-cell lymphoma, unspecified site: Secondary | ICD-10-CM

## 2016-12-03 DIAGNOSIS — I2699 Other pulmonary embolism without acute cor pulmonale: Principal | ICD-10-CM

## 2016-12-03 DIAGNOSIS — C189 Malignant neoplasm of colon, unspecified: Secondary | ICD-10-CM

## 2016-12-03 LAB — CBC
HCT: 26.4 % — ABNORMAL LOW (ref 36.0–46.0)
HEMOGLOBIN: 9.6 g/dL — AB (ref 12.0–15.0)
MCH: 28.8 pg (ref 26.0–34.0)
MCHC: 36.4 g/dL — ABNORMAL HIGH (ref 30.0–36.0)
MCV: 79.3 fL (ref 78.0–100.0)
Platelets: 163 10*3/uL (ref 150–400)
RBC: 3.33 MIL/uL — AB (ref 3.87–5.11)
RDW: 21.9 % — ABNORMAL HIGH (ref 11.5–15.5)
WBC: 7.6 10*3/uL (ref 4.0–10.5)

## 2016-12-03 LAB — BASIC METABOLIC PANEL
ANION GAP: 8 (ref 5–15)
BUN: <5 mg/dL — ABNORMAL LOW (ref 6–20)
CALCIUM: 9 mg/dL (ref 8.9–10.3)
CO2: 25 mmol/L (ref 22–32)
Chloride: 106 mmol/L (ref 101–111)
Creatinine, Ser: 0.5 mg/dL (ref 0.44–1.00)
Glucose, Bld: 95 mg/dL (ref 65–99)
Potassium: 3.3 mmol/L — ABNORMAL LOW (ref 3.5–5.1)
Sodium: 139 mmol/L (ref 135–145)

## 2016-12-03 LAB — TROPONIN I
Troponin I: 0.04 ng/mL (ref <0.03)
Troponin I: 0.03 ng/mL (ref <0.03)
Troponin I: 0.03 ng/mL (ref <0.03)

## 2016-12-03 LAB — PROTIME-INR
INR: 1.04
PROTHROMBIN TIME: 13.6 s (ref 11.4–15.2)

## 2016-12-03 MED ORDER — ENSURE ENLIVE PO LIQD
237.0000 mL | Freq: Two times a day (BID) | ORAL | Status: DC
  Administered 2016-12-03 – 2016-12-04 (×2): 237 mL via ORAL

## 2016-12-03 MED ORDER — FERROUS SULFATE 325 (65 FE) MG PO TABS
325.0000 mg | ORAL_TABLET | Freq: Every day | ORAL | Status: DC
  Administered 2016-12-03 – 2016-12-04 (×2): 325 mg via ORAL
  Filled 2016-12-03 (×2): qty 1

## 2016-12-03 MED ORDER — ACETAMINOPHEN 325 MG PO TABS: 650.0000 mg | ORAL_TABLET | ORAL | Status: DC | PRN

## 2016-12-03 MED ORDER — ATORVASTATIN CALCIUM 20 MG PO TABS
20.0000 mg | ORAL_TABLET | Freq: Every day | ORAL | Status: DC
  Administered 2016-12-03: 20 mg via ORAL
  Filled 2016-12-03 (×3): qty 1

## 2016-12-03 MED ORDER — ONDANSETRON HCL 4 MG/2ML IJ SOLN
4.0000 mg | Freq: Four times a day (QID) | INTRAMUSCULAR | Status: DC | PRN
  Administered 2016-12-03 – 2016-12-04 (×4): 4 mg via INTRAVENOUS
  Filled 2016-12-03 (×4): qty 2

## 2016-12-03 MED ORDER — POLYETHYLENE GLYCOL 3350 17 G PO PACK
17.0000 g | PACK | Freq: Two times a day (BID) | ORAL | Status: DC
  Administered 2016-12-03 – 2016-12-04 (×3): 17 g via ORAL
  Filled 2016-12-03 (×3): qty 1

## 2016-12-03 MED ORDER — MECLIZINE HCL 12.5 MG PO TABS: 25.0000 mg | ORAL_TABLET | Freq: Four times a day (QID) | ORAL | Status: DC | PRN

## 2016-12-03 MED ORDER — MORPHINE SULFATE (PF) 2 MG/ML IV SOLN
2.0000 mg | INTRAVENOUS | Status: DC | PRN
  Administered 2016-12-03 – 2016-12-04 (×6): 2 mg via INTRAVENOUS
  Filled 2016-12-03 (×6): qty 1

## 2016-12-03 MED ORDER — HYDROCODONE-ACETAMINOPHEN 5-325 MG PO TABS
1.0000 | ORAL_TABLET | Freq: Four times a day (QID) | ORAL | Status: DC | PRN
  Administered 2016-12-03 – 2016-12-04 (×3): 1 via ORAL
  Filled 2016-12-03 (×3): qty 1

## 2016-12-03 MED ORDER — ASPIRIN 81 MG PO CHEW
81.0000 mg | CHEWABLE_TABLET | Freq: Every day | ORAL | Status: DC
  Administered 2016-12-03 – 2016-12-04 (×2): 81 mg via ORAL
  Filled 2016-12-03 (×2): qty 1

## 2016-12-03 MED ORDER — PANTOPRAZOLE SODIUM 40 MG PO TBEC
40.0000 mg | DELAYED_RELEASE_TABLET | Freq: Every day | ORAL | Status: DC
  Administered 2016-12-03 – 2016-12-04 (×2): 40 mg via ORAL
  Filled 2016-12-03 (×2): qty 1

## 2016-12-03 NOTE — Consult Note (Signed)
Patient's chart is reviewed.  As documented, on 11/06/2016 by Nurse Navigator, patient is with Hospice as she declined therapy:   Eloise called and stated that Guadalupe Guerra wanted to go with hospice.  Pt also wanted to be at the [facility].  I had spoke with her social worker Jenny Reichmann [phone] and she said that she would take care of the nursing home placement and I would take care of the hospice referral.  Eloise said that the iron was binding her up a bit so I called her in some miralax to take 1-2 times a day as needed.  Hospice referral to [facility] and fax confirmed.  Case is discussed with Dr. Sarajane Jews.    From an anticoagulation standpoint, anticoagulant choice will depend on what Hospice will cover/pay for.    For any questions, call hematology.  Pager: [phone]  Doy Mince 12/03/2016 4:37 PM

## 2016-12-03 NOTE — H&P (Signed)
History and Physical    KHAZA ZIMPFER [id number] DOB: 09-Jan-1930 DOA: 11/18/2016  PCP: Vic Blackbird, MD  Patient coming from: home  Chief Complaint: abdominal pain, chest pain  HPI: Evelyn Baird is a 83 y.o. female with medical history significant of colon cancer diagnosed in sept 17 has opted no treatment, diffuse B cell lymphoma diagnosed last month unclear what the goals of therapy are (please see onc note from dec) comes in with upper abdominal pain and chest pain.  Her symptoms have resolved as of now.  No n/v/d.  No fevers.  No cough.  Pt says she has hospice at home.  When you discuss her wishes and what has been decided about treatment she says "just talk to my children".  She really is not forthcoming about her wishes.  But she does say hospice is already coming to her house.  Pt had a cta done tonight which showed a new PE and extension of her cancer.  Pt referred for admission for treatment of her PE.  Pt is afebrile, with normal oxygen sats.   Review of Systems: As per HPI otherwise 10 point review of systems negative.   Past Medical History:  Diagnosis Date  . Adenocarcinoma of colon ([place]) 11/05/2016  . Anemia   . Coronary atherosclerosis of native coronary artery    Nonobstructive 2009 - previously followed by [facility]  . Diabetes mellitus without complication ([place])   . Diffuse large B-cell lymphoma (HCC)    Non-Hodgkin's  . Essential hypertension, benign   . GERD (gastroesophageal reflux disease)   . Hyperlipidemia   . Hypertension   . Peripheral neuropathy ([place])   . Prediabetes   . PVC's (premature ventricular contractions)     Past Surgical History:  Procedure Laterality Date  . AXILLARY LYMPH NODE BIOPSY Left 10/23/2016   Procedure: LYMPH NODE BIOPSY, LEFT AXILLA;  Surgeon: Aviva Signs, MD;  Location: [facility] ORS;  Service: General;  Laterality: Left;  . BIOPSY  07/19/2016   Procedure: BIOPSY;  Surgeon: Rogene Houston, MD;  Location: [facility] ENDO SUITE;  Service:  Endoscopy;;  colon  . COLONOSCOPY N/A 07/19/2016   Procedure: COLONOSCOPY;  Surgeon: Rogene Houston, MD;  Location: [facility] ENDO SUITE;  Service: Endoscopy;  Laterality: N/A;  12:25  . HERNIA REPAIR    . Left abdominal hernia repair    . POLYPECTOMY  07/19/2016   Procedure: POLYPECTOMY;  Surgeon: Rogene Houston, MD;  Location: [facility] SU[facility];  Service: Endoscopy;;  colon     reports that she has never smoked. She has never used smokeless tobacco. She reports that she does not drink alcohol or use drugs.  Allergies  Allergen Reactions  . Beef-Derived Products Itching and Nausea And Vomiting  . Pork-Derived Products Itching and Nausea And Vomiting    Family History  Problem Relation Age of Onset  . Hypertension Father   . Kidney disease Father     Prior to Admission medications   Medication Sig Start Date End Date Taking? Authorizing Provider  aspirin 81 MG chewable tablet Chew 1 tablet (81 mg total) by mouth daily. 08/03/16  Yes Kathie Dike, MD  atorvastatin (LIPITOR) 20 MG tablet Take 1 tablet (20 mg total) by mouth at bedtime. FOR CHOLESTEROL 09/16/16  Yes Alycia Rossetti, MD  ferrous sulfate 325 (65 FE) MG EC tablet Take 1 tablet (325 mg total) by mouth daily. 10/17/16  Yes Forest Gleason, MD  HYDROcodone-acetaminophen (NORCO) 5-325 MG tablet Take 1 tablet by mouth  every 6 (six) hours as needed for moderate pain. 10/23/16  Yes Aviva Signs, MD  meclizine (ANTIVERT) 25 MG tablet Take 1 tablet (25 mg total) by mouth 4 (four) times daily as needed for dizziness. 07/27/16  Yes Rolland Porter, MD  NITROSTAT 0.4 MG SL tablet DISSOLVE (1) TABLET UNDER TONGUE AS NEEDED TO RELIEVE CHEST PAIN. MAYREPEAT EVERY 5 MINUTES. 05/28/16  Yes Lendon Colonel, NP  pantoprazole (PROTONIX) 40 MG tablet Take 1 tablet (40 mg total) by mouth daily. 09/16/16  Yes Alycia Rossetti, MD  polyethylene glycol [facility] / GLYCOLAX) packet Take 17 g by mouth 2 (two) times daily. 11/06/16  Yes Patrici Ranks, MD    Physical  Exam: Vitals:   11/14/2016 2130 12/11/2016 2145 12/07/2016 2200 11/14/2016 2230  BP: 147/94  148/98 144/99  Pulse: 89 94 84 90  Resp: 24 26 19 22   Temp:      TempSrc:      SpO2: 98% 98% 100% 100%  Weight:      Height:        Constitutional: NAD, calm, comfortable Vitals:   12/08/2016 2130 11/25/2016 2145 11/21/2016 2200 11/18/2016 2230  BP: 147/94  148/98 144/99  Pulse: 89 94 84 90  Resp: 24 26 19 22   Temp:      TempSrc:      SpO2: 98% 98% 100% 100%  Weight:      Height:       Eyes: PERRL, lids and conjunctivae normal ENMT: Mucous membranes are moist. Posterior pharynx clear of any exudate or lesions.Normal dentition.  Neck: normal, supple, no masses, no thyromegaly Respiratory: clear to auscultation bilaterally, no wheezing, no crackles. Normal respiratory effort. No accessory muscle use.  Cardiovascular: Regular rate and rhythm, no murmurs / rubs / gallops. No extremity edema. 2+ pedal pulses. No carotid bruits.  Abdomen: no tenderness, no masses palpated. No hepatosplenomegaly. Bowel sounds positive.  Musculoskeletal: no clubbing / cyanosis. No joint deformity upper and lower extremities. Good ROM, no contractures. Normal muscle tone.  Skin: no rashes, lesions, ulcers. No induration Neurologic: CN 2-12 grossly intact. Sensation intact, DTR normal. Strength 5/5 in all 4.  Psychiatric: Normal judgment and insight. Alert and oriented x 3. Normal mood.    Labs on Admission: I have personally reviewed following labs and imaging studies  CBC:  Recent Labs Lab 11/23/2016 1821  WBC 7.8  NEUTROABS 3.9  HGB 9.7*  HCT 27.0*  MCV 78.3  PLT [id number]   Basic Metabolic Panel:  Recent Labs Lab 11/19/2016 1821  NA 135  K 3.2*  CL 103  CO2 23  GLUCOSE 114*  BUN <5*  CREATININE 0.66  CALCIUM 9.1   GFR: Estimated Creatinine Clearance: 42 mL/min (by C-G formula based on SCr of 0.66 mg/dL). Liver Function Tests:  Recent Labs Lab 11/25/2016 1821  AST 42*  ALT 32  ALKPHOS 102  BILITOT 0.4    PROT 9.2*  ALBUMIN 3.4*    Recent Labs Lab 11/20/2016 1821  LIPASE <10*   Coagulation Profile:  Recent Labs Lab 11/16/2016 2227  INR 1.02   Cardiac Enzymes:  Recent Labs Lab 11/15/2016 1821 12/09/2016 2019  TROPONINI 0.03* 0.04*    Urine analysis:    Component Value Date/Time   COLORURINE YELLOW 11/20/2016 1819   APPEARANCEUR CLEAR 12/11/2016 1819   LABSPEC 1.014 12/09/2016 1819   PHURINE 5.0 11/28/2016 1819   GLUCOSEU NEGATIVE 11/22/2016 1819   HGBUR NEGATIVE 11/26/2016 1819   BILIRUBINUR NEGATIVE 12/10/2016 1819   KETONESUR NEGATIVE  11/18/2016 1819   PROTEINUR 30 (A) 12/11/2016 1819   UROBILINOGEN 0.2 04/26/2014 1226   NITRITE NEGATIVE 11/24/2016 1819   LEUKOCYTESUR NEGATIVE 11/11/2016 1819   Radiological Exams on Admission: Ct Angio Chest Pe W/cm &/or Wo Cm  Result Date: 11/16/2016 CLINICAL DATA:  Chest and abdominal pain.  History of colon cancer. EXAM: CT ANGIOGRAPHY CHEST CT ABDOMEN AND PELVIS WITH CONTRAST TECHNIQUE: Multidetector CT imaging of the chest was performed using the standard protocol during bolus administration of intravenous contrast. Multiplanar CT image reconstructions and MIPs were obtained to evaluate the vascular anatomy. Multidetector CT imaging of the abdomen and pelvis was performed using the standard protocol during bolus administration of intravenous contrast. CONTRAST:  88mL ISOVUE-300 IOPAMIDOL (ISOVUE-300) INJECTION 61% COMPARISON:  10/13/2016. FINDINGS: CTA CHEST FINDINGS Cardiovascular: The heart is markedly enlarged. Coronary artery calcification is noted. No pericardial effusion. Atherosclerotic calcification is noted in the wall of the thoracic aorta. Small volume nonocclusive segmental and subsegmental pulmonary embolus identified in the right upper lobe. No other pulmonary embolic disease is identified. Mediastinum/Nodes: 1.6 cm subcarinal lymph node seen on the previous study is now 1.9 cm. There is right hilar lymphadenopathy. The  esophagus has normal imaging features. Bulky left axillary lymphadenopathy persists in the 3.3 cm short axis left axillary lymph node measured previously is now 3.5 cm. Lymphadenopathy is seen in the right axilla but less advanced than on the contralateral side. Lungs/Pleura: There is bilateral lower lobe collapse/consolidation with small to moderate bilateral pleural effusions. Musculoskeletal: Bone windows reveal no worrisome lytic or sclerotic osseous lesions. Review of the MIP images confirms the above findings. CT ABDOMEN and PELVIS FINDINGS Hepatobiliary: 12 mm low-density lesion in the dome of the liver has progressed in the interval. 8 mm posterior right liver lesion has progressed. The 10 mm inferior right liver lesion measured previously is now 13 mm (image 32 series 12) and additional lesions in the inferior tip of the right liver have progressed. There is no evidence for gallstones, gallbladder wall thickening, or pericholecystic fluid. No intrahepatic or extrahepatic biliary dilation. Pancreas: No focal mass lesion. No dilatation of the main duct. No intraparenchymal cyst. No peripancreatic edema. Spleen: No splenomegaly. No focal mass lesion. Adrenals/Urinary Tract: No adrenal nodule or mass. Bilateral renal cysts are evident. No hydroureteronephrosis. The urinary bladder appears normal for the degree of distention. Stomach/Bowel: Stomach is nondistended. No gastric wall thickening. No evidence of outlet obstruction. Duodenum is normally positioned as is the ligament of Treitz. No small bowel wall thickening. No small bowel dilatation. The terminal ileum is normal. The appendix is not visualized, but there is no edema or inflammation in the region of the cecum. 2.6 x 3.6 cm masslike soft tissue lesion is identified adjacent to the mid transverse colon, in the region of wall thickening seen previously. Multiple omental nodules are evident. Vascular/Lymphatic: There is abdominal aortic atherosclerosis  without aneurysm. Multiple omental nodules are evident. Upper normal to borderline enlarged lymph nodes are seen in the root of the small bowel mesentery and retroperitoneal space. 3.2 x 5.9 cm collection of abnormal soft tissue is identified along the posterior left pelvic sidewall extending into the presacral space. Borderline lymphadenopathy is identified in the groin regions bilaterally. Reproductive: Uterus is surgically absent. There is no adnexal mass. Other: No substantial intraperitoneal free fluid. Musculoskeletal: 15 mm lucent lesion identified left sacrum. Diffuse body wall edema is evident. Review of the MIP images confirms the above findings. IMPRESSION: 1. Small nonobstructive acute pulmonary embolus 2 segmental branch  right upper lobe. 2. Bilateral lower lobe collapse/consolidation with small bilateral pleural effusions. 3. Interval progression of abnormal soft tissue associated with the transverse colon. 4. Interval progression of multiple liver lesions, highly suspicious for metastatic disease. 5. Progression of subcarinal, right hilar, and bulky left axillary lymphadenopathy. 6. Persistent abnormal soft tissue in the posterior left pelvic sidewall extending into the presacral space, as before. 7. 15 mm lucent lesion left sacrum. Metastatic involvement not excluded. Critical Value/emergent results were called by telephone at the time of interpretation on 11/11/2016 at 10:16 pm to Dr. Francine Graven , who verbally acknowledged these results. Electronically Signed   By: Misty Stanley M.D.   On: 12/01/2016 22:16   Ct Abdomen Pelvis W Contrast  Result Date: 11/12/2016 CLINICAL DATA:  Chest and abdominal pain.  History of colon cancer. EXAM: CT ANGIOGRAPHY CHEST CT ABDOMEN AND PELVIS WITH CONTRAST TECHNIQUE: Multidetector CT imaging of the chest was performed using the standard protocol during bolus administration of intravenous contrast. Multiplanar CT image reconstructions and MIPs were obtained  to evaluate the vascular anatomy. Multidetector CT imaging of the abdomen and pelvis was performed using the standard protocol during bolus administration of intravenous contrast. CONTRAST:  91mL ISOVUE-300 IOPAMIDOL (ISOVUE-300) INJECTION 61% COMPARISON:  10/13/2016. FINDINGS: CTA CHEST FINDINGS Cardiovascular: The heart is markedly enlarged. Coronary artery calcification is noted. No pericardial effusion. Atherosclerotic calcification is noted in the wall of the thoracic aorta. Small volume nonocclusive segmental and subsegmental pulmonary embolus identified in the right upper lobe. No other pulmonary embolic disease is identified. Mediastinum/Nodes: 1.6 cm subcarinal lymph node seen on the previous study is now 1.9 cm. There is right hilar lymphadenopathy. The esophagus has normal imaging features. Bulky left axillary lymphadenopathy persists in the 3.3 cm short axis left axillary lymph node measured previously is now 3.5 cm. Lymphadenopathy is seen in the right axilla but less advanced than on the contralateral side. Lungs/Pleura: There is bilateral lower lobe collapse/consolidation with small to moderate bilateral pleural effusions. Musculoskeletal: Bone windows reveal no worrisome lytic or sclerotic osseous lesions. Review of the MIP images confirms the above findings. CT ABDOMEN and PELVIS FINDINGS Hepatobiliary: 12 mm low-density lesion in the dome of the liver has progressed in the interval. 8 mm posterior right liver lesion has progressed. The 10 mm inferior right liver lesion measured previously is now 13 mm (image 32 series 12) and additional lesions in the inferior tip of the right liver have progressed. There is no evidence for gallstones, gallbladder wall thickening, or pericholecystic fluid. No intrahepatic or extrahepatic biliary dilation. Pancreas: No focal mass lesion. No dilatation of the main duct. No intraparenchymal cyst. No peripancreatic edema. Spleen: No splenomegaly. No focal mass lesion.  Adrenals/Urinary Tract: No adrenal nodule or mass. Bilateral renal cysts are evident. No hydroureteronephrosis. The urinary bladder appears normal for the degree of distention. Stomach/Bowel: Stomach is nondistended. No gastric wall thickening. No evidence of outlet obstruction. Duodenum is normally positioned as is the ligament of Treitz. No small bowel wall thickening. No small bowel dilatation. The terminal ileum is normal. The appendix is not visualized, but there is no edema or inflammation in the region of the cecum. 2.6 x 3.6 cm masslike soft tissue lesion is identified adjacent to the mid transverse colon, in the region of wall thickening seen previously. Multiple omental nodules are evident. Vascular/Lymphatic: There is abdominal aortic atherosclerosis without aneurysm. Multiple omental nodules are evident. Upper normal to borderline enlarged lymph nodes are seen in the root of the small bowel  mesentery and retroperitoneal space. 3.2 x 5.9 cm collection of abnormal soft tissue is identified along the posterior left pelvic sidewall extending into the presacral space. Borderline lymphadenopathy is identified in the groin regions bilaterally. Reproductive: Uterus is surgically absent. There is no adnexal mass. Other: No substantial intraperitoneal free fluid. Musculoskeletal: 15 mm lucent lesion identified left sacrum. Diffuse body wall edema is evident. Review of the MIP images confirms the above findings. IMPRESSION: 1. Small nonobstructive acute pulmonary embolus 2 segmental branch right upper lobe. 2. Bilateral lower lobe collapse/consolidation with small bilateral pleural effusions. 3. Interval progression of abnormal soft tissue associated with the transverse colon. 4. Interval progression of multiple liver lesions, highly suspicious for metastatic disease. 5. Progression of subcarinal, right hilar, and bulky left axillary lymphadenopathy. 6. Persistent abnormal soft tissue in the posterior left pelvic  sidewall extending into the presacral space, as before. 7. 15 mm lucent lesion left sacrum. Metastatic involvement not excluded. Critical Value/emergent results were called by telephone at the time of interpretation on 11/29/2016 at 10:16 pm to Dr. Francine Graven , who verbally acknowledged these results. Electronically Signed   By: Misty Stanley M.D.   On: 11/30/2016 22:16    Assessment/Plan 83 yo female with colon cancer and diffuse b cell lymphoma not undergoing any treatment as of yet who may already be a hospice patient found to have a new pulmonary emboli  Principal Problem:   Pulmonary embolism ([place])- place on lovenox 1mg /kg sq q 12 hours.  Pt stable.  Serial trop.  Can transition to [place] tomorrow.  Active Problems:  See discussion below   Diffuse large B cell lymphoma ([place]) dx 12/17, has not decided about treatment plan yet   Chest pain   Abdominal pain   Colon cancer ([place]) dx 9/17 opted no surgery   Hyperlipidemia   Essential hypertension, benign   Coronary atherosclerosis of native coronary artery   Aortic valve disorder   IDA (iron deficiency anemia)   Unclear what the social situation is, no family present.  Pt defers all decisions to her children, she has 6.  It sounds like since her conversation with oncology last month hospice has been set up but not sure about this.  Will consult oncology for further clarification of plan of care here.  Unknown if she is even a good candidate for treatment of her lymphoma at this point.  Code status is presumptive full code, as patient is oriented to person, place or time but it is clear she really does not want to make any decisions without her children.   DVT prophylaxis:  lovenox Code Status:  full Family Communication:  none  Disposition Plan:  Per day team, ??may have hospice already at home, unclear Consults called:  oncology Admission status:  observation   Verla Bryngelson A MD [facility]  If 7PM-7AM, please contact  night-coverage [email or web address] Password [facility]  12/03/2016, 12:03 AM

## 2016-12-03 NOTE — Progress Notes (Signed)
PROGRESS NOTE  Evelyn Baird [id number] DOB: 08-28-1930 DOA: 12/04/2016 PCP: Vic Blackbird, MD  Brief Narrative: 83 year old woman PMH colon cancer, diffuse B-cell lymphoma presented with upper abdominal pain and chest pain. CT angiogram of the chest revealed pulmonary embolism she was referred for further treatment.  Assessment/Plan 1. Acute pulmonary embolism right upper lobe secondary malignancy. CT chest with bilateral lower lobe collapse. No evidence of infection.  No hypoxia. No CT evidence to suggest right heart strain.  Continue Lovenox. Follow-up oncology recommendations for outpatient treatment. 2. Colon cancer, diffuse large B-cell lymphoma. Patient has declined treatment. She has enrolled in outpatient hospice. DO NOT RESUSCITATE per daughter.  Follow-up further recommendations per oncology. 3. [place] troponin elevation. Flat. EKG nonacute. No evidence of ACS. No further evaluation recommended. 4. Diabetes mellitus type 2, stable, fasting blood sugar 95.   Overall appears to be stable. We'll continue treatment for pulmonary embolism follow-up oncology recommendations. Anticipate discharge 1/24.  DVT prophylaxis: Lovenox Code Status: DNR Family Communication: daughter, son-in-law at bedside Disposition Plan: home with hospice   Murray Hodgkins, MD  [facility] Direct contact: [phone] --Via amion app OR  --[email or web address]; password [email or web address]  7PM-7AM contact night coverage as above 12/03/2016, 1:41 PM  LOS: 0 days   Consultants:  Oncology  Procedures:    Antimicrobials:    Interval history/Subjective: She reports some chest and abdominal pain. History vague.  Objective: Vitals:   12/03/16 0100 12/03/16 0134 12/03/16 0438 12/03/16 0451  BP: 139/89 (!) 144/88 (!) 152/101 (!) 148/91  Pulse: 79 91 91   Resp: 18 18 18    Temp:  98.1 F (36.7 C) 97.8 F (36.6 C)   TempSrc:  Oral Oral   SpO2: 95% 97% 95%   Weight:      Height:         Intake/Output Summary (Last 24 hours) at 12/03/16 1341 Last data filed at 12/03/16 1300  Gross per 24 hour  Intake              240 ml  Output                0 ml  Net              240 ml     Filed Weights   11/28/2016 1810  Weight: 50.8 kg (112 lb)    Exam:    Constitutional: Appears uncomfortable but not toxic. Calm. No acute distress.  Cardiovascular. Regular rate and rhythm. No murmur, or gallop. Telemetry sinus tachycardia.  Respiratory. Clear to auscultation bilaterally. No wheezes, rales or rhonchi. Normal respiratory effort.  Abdomen soft, mild generalized tenderness.    I have personally reviewed following labs and imaging studies:  Troponins flat, 0.03  Potassium 3.3. Basic metabolic panel otherwise unremarkable.  Hemoglobin stable 9.6. Platelets 163. WBC 7.6.  Scheduled Meds: . aspirin  81 mg Oral Daily  . atorvastatin  20 mg Oral QHS  . enoxaparin (LOVENOX) injection  1 mg/kg Subcutaneous Q12H  . feeding supplement (ENSURE ENLIVE)  237 mL Oral BID BM  . ferrous sulfate  325 mg Oral Daily  . pantoprazole  40 mg Oral Daily  . polyethylene glycol  17 g Oral BID   Continuous Infusions:  Principal Problem:   Pulmonary embolism (H[facility]) Active Problems:   Coronary atherosclerosis of native coronary artery   Diffuse large B cell lymphoma ([place]) dx 12/17, has not decided about treatment plan yet   Chest pain   Abdominal pain   Colon cancer ([place])  dx 9/17 opted no surgery   LOS: 0 days

## 2016-12-03 NOTE — Care Management Obs Status (Signed)
[place] NOTIFICATION   Patient Details  Name: Evelyn Baird MRN: [id number] Date of Birth: [birth year]   Medicare Observation Status Notification Given:  Yes    Cairo Agostinelli, Chauncey Reading, RN 12/03/2016, 12:06 PM

## 2016-12-03 NOTE — Care Management Note (Signed)
Case Management Note  Patient Details  Name: MICHALLA CELESTINE MRN: [id number] Date of Birth: February 04, 1930  Subjective/Objective:                  Patient adm from home with PE. She is currently active with [facility], she has colon CA and has elected to have no treatments. Daughter at bedside offering information.   Action/Plan: Anticipate DC home to continue hospice services.    Expected Discharge Date:         12/03/2016         Expected Discharge Plan:  Home w Hospice Care  In-House Referral:  NA  Discharge planning Services  CM Consult  Post Acute Care Choice:  NA Choice offered to:  NA  DME Arranged:    DME Agency:     HH Arranged:    HH Agency:     Status of Service:  In process, will continue to follow  If discussed at Long Length of Stay Meetings, dates discussed:    Additional Comments:  Edom Schmuhl, Chauncey Reading, RN 12/03/2016, 11:48 AM

## 2016-12-03 NOTE — ED Notes (Signed)
Hospice Nurse on Call Number [phone].  Call if any information is needed about the patient.

## 2016-12-04 ENCOUNTER — Observation Stay (HOSPITAL_COMMUNITY)

## 2016-12-04 DIAGNOSIS — I2699 Other pulmonary embolism without acute cor pulmonale: Secondary | ICD-10-CM | POA: Diagnosis present

## 2016-12-04 DIAGNOSIS — D509 Iron deficiency anemia, unspecified: Secondary | ICD-10-CM | POA: Diagnosis present

## 2016-12-04 DIAGNOSIS — C187 Malignant neoplasm of sigmoid colon: Secondary | ICD-10-CM

## 2016-12-04 DIAGNOSIS — E785 Hyperlipidemia, unspecified: Secondary | ICD-10-CM | POA: Diagnosis present

## 2016-12-04 DIAGNOSIS — Z7982 Long term (current) use of aspirin: Secondary | ICD-10-CM | POA: Diagnosis not present

## 2016-12-04 DIAGNOSIS — I10 Essential (primary) hypertension: Secondary | ICD-10-CM | POA: Diagnosis present

## 2016-12-04 DIAGNOSIS — Z841 Family history of disorders of kidney and ureter: Secondary | ICD-10-CM | POA: Diagnosis not present

## 2016-12-04 DIAGNOSIS — C8330 Diffuse large B-cell lymphoma, unspecified site: Secondary | ICD-10-CM | POA: Diagnosis present

## 2016-12-04 DIAGNOSIS — I2602 Saddle embolus of pulmonary artery with acute cor pulmonale: Secondary | ICD-10-CM | POA: Diagnosis not present

## 2016-12-04 DIAGNOSIS — C189 Malignant neoplasm of colon, unspecified: Secondary | ICD-10-CM | POA: Diagnosis present

## 2016-12-04 DIAGNOSIS — I2510 Atherosclerotic heart disease of native coronary artery without angina pectoris: Secondary | ICD-10-CM | POA: Diagnosis present

## 2016-12-04 DIAGNOSIS — Z9889 Other specified postprocedural states: Secondary | ICD-10-CM | POA: Diagnosis not present

## 2016-12-04 DIAGNOSIS — K219 Gastro-esophageal reflux disease without esophagitis: Secondary | ICD-10-CM | POA: Diagnosis present

## 2016-12-04 DIAGNOSIS — E119 Type 2 diabetes mellitus without complications: Secondary | ICD-10-CM | POA: Diagnosis present

## 2016-12-04 DIAGNOSIS — Z515 Encounter for palliative care: Secondary | ICD-10-CM | POA: Diagnosis present

## 2016-12-04 DIAGNOSIS — C799 Secondary malignant neoplasm of unspecified site: Secondary | ICD-10-CM | POA: Diagnosis present

## 2016-12-04 DIAGNOSIS — I493 Ventricular premature depolarization: Secondary | ICD-10-CM | POA: Diagnosis present

## 2016-12-04 DIAGNOSIS — I359 Nonrheumatic aortic valve disorder, unspecified: Secondary | ICD-10-CM | POA: Diagnosis present

## 2016-12-04 DIAGNOSIS — Z66 Do not resuscitate: Secondary | ICD-10-CM | POA: Diagnosis present

## 2016-12-04 DIAGNOSIS — Z79899 Other long term (current) drug therapy: Secondary | ICD-10-CM | POA: Diagnosis not present

## 2016-12-04 DIAGNOSIS — Z8249 Family history of ischemic heart disease and other diseases of the circulatory system: Secondary | ICD-10-CM | POA: Diagnosis not present

## 2016-12-04 LAB — BASIC METABOLIC PANEL
Anion gap: 12 (ref 5–15)
BUN: <5 mg/dL — ABNORMAL LOW (ref 6–20)
CALCIUM: 9.2 mg/dL (ref 8.9–10.3)
CO2: 23 mmol/L (ref 22–32)
CREATININE: 0.89 mg/dL (ref 0.44–1.00)
Chloride: 102 mmol/L (ref 101–111)
GFR calc non Af Amer: 58 mL/min — ABNORMAL LOW (ref >60)
GLUCOSE: 150 mg/dL — AB (ref 65–99)
Potassium: 3.8 mmol/L (ref 3.5–5.1)
Sodium: 137 mmol/L (ref 135–145)

## 2016-12-04 LAB — CBC
HCT: 31.1 % — ABNORMAL LOW (ref 36.0–46.0)
Hemoglobin: 11.5 g/dL — ABNORMAL LOW (ref 12.0–15.0)
MCH: 29.4 pg (ref 26.0–34.0)
MCHC: 37 g/dL — ABNORMAL HIGH (ref 30.0–36.0)
MCV: 79.5 fL (ref 78.0–100.0)
PLATELETS: 153 10*3/uL (ref 150–400)
RBC: 3.91 MIL/uL (ref 3.87–5.11)
RDW: 21.7 % — ABNORMAL HIGH (ref 11.5–15.5)
WBC: 8.2 10*3/uL (ref 4.0–10.5)

## 2016-12-04 LAB — URINE CULTURE: CULTURE: NO GROWTH

## 2016-12-04 LAB — BRAIN NATRIURETIC PEPTIDE: B Natriuretic Peptide: 311 pg/mL — ABNORMAL HIGH (ref 0.0–100.0)

## 2016-12-04 LAB — TROPONIN I: Troponin I: 0.05 ng/mL (ref <0.03)

## 2016-12-04 MED ORDER — ALBUTEROL SULFATE (2.5 MG/3ML) 0.083% IN NEBU
2.5000 mg | INHALATION_SOLUTION | Freq: Once | RESPIRATORY_TRACT | Status: AC
  Administered 2016-12-04: 2.5 mg via RESPIRATORY_TRACT

## 2016-12-04 MED ORDER — MORPHINE SULFATE 25 MG/ML IV SOLN
1.0000 mg/h | INTRAVENOUS | Status: DC
  Administered 2016-12-04: 1 mg/h via INTRAVENOUS
  Filled 2016-12-04: qty 10

## 2016-12-04 MED ORDER — IPRATROPIUM BROMIDE 0.02 % IN SOLN
0.5000 mg | Freq: Once | RESPIRATORY_TRACT | Status: AC
  Administered 2016-12-04: 0.5 mg via RESPIRATORY_TRACT

## 2016-12-04 MED ORDER — IPRATROPIUM-ALBUTEROL 0.5-2.5 (3) MG/3ML IN SOLN
3.0000 mL | RESPIRATORY_TRACT | Status: DC
  Administered 2016-12-04: 3 mL via RESPIRATORY_TRACT
  Filled 2016-12-04: qty 3

## 2016-12-04 MED ORDER — IPRATROPIUM BROMIDE 0.02 % IN SOLN
RESPIRATORY_TRACT | Status: AC
  Administered 2016-12-04: 0.5 mg via RESPIRATORY_TRACT
  Filled 2016-12-04: qty 2.5

## 2016-12-04 MED ORDER — SODIUM CHLORIDE 0.9 % IV SOLN: 1.0000 mg/h | INTRAVENOUS | Status: DC

## 2016-12-04 MED ORDER — SCOPOLAMINE 1 MG/3DAYS TD PT72
1.0000 | MEDICATED_PATCH | TRANSDERMAL | Status: DC
  Administered 2016-12-04: 1.5 mg via TRANSDERMAL
  Filled 2016-12-04 (×2): qty 1

## 2016-12-04 MED ORDER — ALBUTEROL SULFATE (2.5 MG/3ML) 0.083% IN NEBU
INHALATION_SOLUTION | RESPIRATORY_TRACT | Status: AC
  Administered 2016-12-04: 2.5 mg via RESPIRATORY_TRACT
  Filled 2016-12-04: qty 3

## 2016-12-04 MED ORDER — IPRATROPIUM-ALBUTEROL 0.5-2.5 (3) MG/3ML IN SOLN: 3.0000 mL | Freq: Four times a day (QID) | RESPIRATORY_TRACT | Status: DC

## 2016-12-04 NOTE — Progress Notes (Signed)
Pt alert, no longer tachypnic, does still have labored breathing, but is no longer anxious and distressed. Pt is coughing tan/pink tinged sputum up.  States she feels much better and that she feels better every time she gets mucus up.  Pt oxygen saturation at 95% at this time.

## 2016-12-04 NOTE — Progress Notes (Signed)
  PROGRESS NOTE  Evelyn Baird [id number] DOB: 22-Jul-1930 DOA: 11/20/2016 PCP: Vic Blackbird, MD  Brief Narrative: 83 year old woman PMH colon cancer, diffuse B-cell lymphoma presented with upper abdominal pain and chest pain. CT angiogram of the chest revealed pulmonary embolism she was referred for further treatment.  Assessment/Plan 1. Acute pulmonary embolism right upper lobe secondary malignancy. CT chest with bilateral lower lobe collapse. No evidence of infection. 2. Colon cancer, diffuse large B-cell lymphoma. Patient has declined treatment. She has enrolled in outpatient hospice. DO NOT RESUSCITATE per daughter. 3. Modest troponin elevation. Flat. EKG nonacute. No evidence of ACS. No further evaluation recommended. 4. Diabetes mellitus type 2, stable, fasting blood sugar 95.    Has had significant clinical decompensation this morning, now with tachypnea, rhonchorous breath sounds and in obvious respiratory distress. Had a meeting with son and daughter today and we have elected to proceed with comfort care, she is already a home hospice patient. Given her significant distress have started her on a morphine drip. I would anticipate a hospital death.  DVT prophylaxis: Lovenox Code Status: DNR Family Communication: daughter, son at bedside Disposition Plan: hospital death vs residential hospice  [name], MD [facility] Pager: [phone]   12/04/2016, 3:54 PM  LOS: 0 days   Consultants:  Oncology  Procedures:  None  Antimicrobials:    Interval history/Subjective: Unresponsive, in significant respiratory distress  Objective: Vitals:   12/04/16 0627 12/04/16 0744 12/04/16 1124 12/04/16 1525  BP:   (!) 135/97 122/77  Pulse:   (!) 136 (!) 120  Resp:   (!) 40 (!) 25  Temp:    99.6 F (37.6 C)  TempSrc:    Oral  SpO2: 96% 90% (!) 88% 92%  Weight:      Height:        Intake/Output Summary (Last 24 hours) at 12/04/16 1554 Last data filed at  12/03/16 2300  Gross per 24 hour  Intake              360 ml  Output              200 ml  Net              160 ml     Filed Weights   11/20/2016 1810  Weight: 50.8 kg (112 lb)    Exam:    Constitutional: Unresponsive, significant respiratory distress  Cardiovascular. Regular rate and rhythm. No murmur, or gallop. Telemetry sinus tachycardia.  Respiratory. Tachypneic, rhonchorous, coarse breath sounds  Abdomen soft, mild generalized tenderness.    I have personally reviewed following labs and imaging studies:  Troponins flat, 0.03  Potassium 3.3. Basic metabolic panel otherwise unremarkable.  Hemoglobin stable 9.6. Platelets 163. WBC 7.6.  Scheduled Meds: . scopolamine  1 patch Transdermal Q72H   Continuous Infusions: . morphine 1 mg/hr (12/04/16 1236)    Principal Problem:   Pulmonary embolism (HCC) Active Problems:   Coronary atherosclerosis of native coronary artery   Diffuse large B cell lymphoma ([place]) dx 12/17, has not decided about treatment plan yet   Chest pain   Abdominal pain   Colon cancer ([place]) dx 9/17 opted no surgery   LOS: 0 days    [name], MD [facility] Pager: [phone]

## 2016-12-04 NOTE — Progress Notes (Signed)
Dr. Shanon Brow updated on pt condition.  Asked for lasiks at this time.  Received order for chest x ray, BMP, BNP, and troponin

## 2016-12-04 NOTE — Progress Notes (Signed)
Patient breathing improving, but still labored RR 26. Titrated Morphine drip 2mg /hr as ordered by Dr. Jerilee Hoh.

## 2016-12-04 NOTE — Progress Notes (Signed)
Tele called stating pt was tachycardic in the 140s.  Pt short of breath with oxygen saturation in the low 80s.  Dr. Shanon Brow paged at this time.  Duo-neb q6h PRN verbal order.  Respiratory called and in room at this time to give breathing treatment. Will continue to monitor.

## 2016-12-04 NOTE — Progress Notes (Signed)
Patient diaphoretic, clammy. O2 sats 88% High flow [place] 15L. Patient labored breathing, Rhonchi lung sounds, congested. BP 135/97, pulse 136, RR 40. Patient 's family does not want patient to have more Morphine due to it makes patient sleepy. Dr. Jerilee Hoh notified. Dr. Jerilee Hoh in room to assess patient and talk with family.

## 2016-12-05 NOTE — Progress Notes (Signed)
  PROGRESS NOTE  Evelyn Baird [id number] DOB: 09-23-1930 DOA: 11/29/2016 PCP: Vic Blackbird, MD  Brief Narrative: 83 year old woman PMH colon cancer, diffuse B-cell lymphoma presented with upper abdominal pain and chest pain. CT angiogram of the chest revealed pulmonary embolism she was referred for further treatment.  Assessment/Plan 1. Acute pulmonary embolism right upper lobe secondary malignancy. CT chest with bilateral lower lobe collapse. No evidence of infection. 2. Colon cancer, diffuse large B-cell lymphoma. Patient has declined treatment. She has enrolled in outpatient hospice. DO NOT RESUSCITATE per daughter. 3. Modest troponin elevation. Flat. EKG nonacute. No evidence of ACS. No further evaluation recommended. 4. Diabetes mellitus type 2, stable, fasting blood sugar 95.    Has had significant clinical decompensation , now with tachypnea, rhonchorous breath sounds and in obvious respiratory distress. Had a meeting with son and daughter 1/24 and we have elected to proceed with comfort care, she is already a home hospice patient. Morphine drip has been uptitrated today to 4 mg at hour, at time of my exam she is resting comfortably. I would anticipate a hospital death.  DVT prophylaxis: Lovenox Code Status: DNR Family Communication: daughter, son at bedside Disposition Plan: hospital death vs residential hospice  [name], MD [facility] Pager: [phone]   12/05/2016, 4:20 PM  LOS: 1 day   Consultants:  Oncology  Procedures:  None  Antimicrobials:    Interval history/Subjective: Unresponsive, in significant respiratory distress  Objective: Vitals:   12/04/16 0627 12/04/16 0744 12/04/16 1124 12/04/16 1525  BP:   (!) 135/97 122/77  Pulse:   (!) 136 (!) 120  Resp:   (!) 40 (!) 25  Temp:    99.6 F (37.6 C)  TempSrc:    Oral  SpO2: 96% 90% (!) 88% 92%  Weight:      Height:        Intake/Output Summary (Last 24 hours) at 12/05/16  1620 Last data filed at 12/05/16 0550  Gross per 24 hour  Intake            33.93 ml  Output                0 ml  Net            33.93 ml     Filed Weights   11/15/2016 1810  Weight: 50.8 kg (112 lb)    Exam:    Constitutional: Unresponsive, significant respiratory distress  Cardiovascular. Regular rate and rhythm. No murmur, or gallop. Telemetry sinus tachycardia.  Respiratory. Tachypneic, rhonchorous, coarse breath sounds  Abdomen soft, mild generalized tenderness.    I have personally reviewed following labs and imaging studies:  Troponins flat, 0.03  Potassium 3.3. Basic metabolic panel otherwise unremarkable.  Hemoglobin stable 9.6. Platelets 163. WBC 7.6.  Scheduled Meds: . scopolamine  1 patch Transdermal Q72H   Continuous Infusions: . morphine 4 mg/hr (12/05/16 0550)    Principal Problem:   Pulmonary embolism (HCC) Active Problems:   Coronary atherosclerosis of native coronary artery   Diffuse large B cell lymphoma ([place]) dx 12/17, has not decided about treatment plan yet   Chest pain   Abdominal pain   Colon cancer ([place]) dx 9/17 opted no surgery   LOS: 1 day    [name], MD [facility] Pager: [phone]

## 2016-12-09 ENCOUNTER — Telehealth: Payer: Self-pay | Admitting: *Deleted

## 2016-12-09 NOTE — Telephone Encounter (Signed)
Received call from [place] with [facility].   Reported patient passing on Dec 29, 2016.

## 2016-12-12 NOTE — Progress Notes (Signed)
RN called [employer] about pt passing. No candidate d/t her age. Reference # [id number]

## 2016-12-12 NOTE — Progress Notes (Signed)
RN called [facility] and gave report to [place]; I was adv that they are waiting for Dilaudid medication from pharmacy before pt is transported there. Family made aware of plan.

## 2016-12-12 NOTE — Discharge Summary (Addendum)
Physician Discharge Summary  Evelyn Baird [id number] DOB: 07-Dec-1929 DOA: 11/18/2016  PCP: Vic Blackbird, MD  Admit date: 11/25/2016 Discharge date: 04-Jan-2017  Time spent: 35 minutes  Recommendations for Outpatient Follow-up:  -Will be discharged to residential hospice today for end of life care.   Discharge Diagnoses:  Principal Problem:   Pulmonary embolism ([place]) Active Problems:   Coronary atherosclerosis of native coronary artery   Diffuse large B cell lymphoma ([place]) dx 12/17, has not decided about treatment plan yet   Chest pain   Abdominal pain   Colon cancer ([place]) dx 9/17 opted no surgery   Discharge Condition: Evelyn Baird Weights   11/26/2016 1810  Weight: 50.8 kg (112 lb)    History of present illness:  As per Dr. Shanon Brow on 1/23: Evelyn Baird is a 83 y.o. female with medical history significant of colon cancer diagnosed in sept 17 has opted no treatment, diffuse B cell lymphoma diagnosed last month unclear what the goals of therapy are (please see onc note from dec) comes in with upper abdominal pain and chest pain.  Her symptoms have resolved as of now.  No n/v/d.  No fevers.  No cough.  Pt says she has hospice at home.  When you discuss her wishes and what has been decided about treatment she says "just talk to my children".  She really is not forthcoming about her wishes.  But she does say hospice is already coming to her house.  Pt had a cta done tonight which showed a new PE and extension of her cancer.  Pt referred for admission for treatment of her PE.  Pt is afebrile, with normal oxygen sats.  Hospital Course:   1. Acute pulmonary embolism right upper lobe secondary malignancy. CT chest with bilateral lower lobe collapse. No evidence of infection. 2. Colon cancer, diffuse large B-cell lymphoma. Patient has declined treatment. She has enrolled in outpatient hospice. DO NOT RESUSCITATE per daughter. 3. Modest troponin elevation. Flat. EKG nonacute. No  evidence of ACS. No further evaluation recommended. 4. Diabetes mellitus type 2, stable, fasting blood sugar 95.   Has had significant clinical decompensation , now with tachypnea, rhonchorous breath sounds and in obvious respiratory distress. Had a meeting with son and daughter 1/24 and we have elected to proceed with comfort care, she is already a home hospice patient. Morphine drip has been uptitrated  to 4 mg at hour, at time of my exam she is resting comfortably. Will be transferred to residential hospice for end of life care.  Procedures:  None   Consultations:  None  Discharge Instructions    Allergies  Allergen Reactions  . Beef-Derived Products Itching and Nausea And Vomiting  . Pork-Derived Products Itching and Nausea And Vomiting      The results of significant diagnostics from this hospitalization (including imaging, microbiology, ancillary and laboratory) are listed below for reference.    Significant Diagnostic Studies: Ct Angio Chest Pe W/cm &/or Wo Cm  Result Date: 12/11/2016 CLINICAL DATA:  Chest and abdominal pain.  History of colon cancer. EXAM: CT ANGIOGRAPHY CHEST CT ABDOMEN AND PELVIS WITH CONTRAST TECHNIQUE: Multidetector CT imaging of the chest was performed using the standard protocol during bolus administration of intravenous contrast. Multiplanar CT image reconstructions and MIPs were obtained to evaluate the vascular anatomy. Multidetector CT imaging of the abdomen and pelvis was performed using the standard protocol during bolus administration of intravenous contrast. CONTRAST:  30mL ISOVUE-300 IOPAMIDOL (ISOVUE-300) INJECTION 61% COMPARISON:  10/13/2016. FINDINGS: CTA CHEST FINDINGS Cardiovascular: The heart is markedly enlarged. Coronary artery calcification is noted. No pericardial effusion. Atherosclerotic calcification is noted in the wall of the thoracic aorta. Small volume nonocclusive segmental and subsegmental pulmonary embolus identified in the  right upper lobe. No other pulmonary embolic disease is identified. Mediastinum/Nodes: 1.6 cm subcarinal lymph node seen on the previous study is now 1.9 cm. There is right hilar lymphadenopathy. The esophagus has normal imaging features. Bulky left axillary lymphadenopathy persists in the 3.3 cm short axis left axillary lymph node measured previously is now 3.5 cm. Lymphadenopathy is seen in the right axilla but less advanced than on the contralateral side. Lungs/Pleura: There is bilateral lower lobe collapse/consolidation with small to moderate bilateral pleural effusions. Musculoskeletal: Bone windows reveal no worrisome lytic or sclerotic osseous lesions. Review of the MIP images confirms the above findings. CT ABDOMEN and PELVIS FINDINGS Hepatobiliary: 12 mm low-density lesion in the dome of the liver has progressed in the interval. 8 mm posterior right liver lesion has progressed. The 10 mm inferior right liver lesion measured previously is now 13 mm (image 32 series 12) and additional lesions in the inferior tip of the right liver have progressed. There is no evidence for gallstones, gallbladder wall thickening, or pericholecystic fluid. No intrahepatic or extrahepatic biliary dilation. Pancreas: No focal mass lesion. No dilatation of the main duct. No intraparenchymal cyst. No peripancreatic edema. Spleen: No splenomegaly. No focal mass lesion. Adrenals/Urinary Tract: No adrenal nodule or mass. Bilateral renal cysts are evident. No hydroureteronephrosis. The urinary bladder appears normal for the degree of distention. Stomach/Bowel: Stomach is nondistended. No gastric wall thickening. No evidence of outlet obstruction. Duodenum is normally positioned as is the ligament of Treitz. No small bowel wall thickening. No small bowel dilatation. The terminal ileum is normal. The appendix is not visualized, but there is no edema or inflammation in the region of the cecum. 2.6 x 3.6 cm masslike soft tissue lesion is  identified adjacent to the mid transverse colon, in the region of wall thickening seen previously. Multiple omental nodules are evident. Vascular/Lymphatic: There is abdominal aortic atherosclerosis without aneurysm. Multiple omental nodules are evident. Upper normal to borderline enlarged lymph nodes are seen in the root of the small bowel mesentery and retroperitoneal space. 3.2 x 5.9 cm collection of abnormal soft tissue is identified along the posterior left pelvic sidewall extending into the presacral space. Borderline lymphadenopathy is identified in the groin regions bilaterally. Reproductive: Uterus is surgically absent. There is no adnexal mass. Other: No substantial intraperitoneal free fluid. Musculoskeletal: 15 mm lucent lesion identified left sacrum. Diffuse body wall edema is evident. Review of the MIP images confirms the above findings. IMPRESSION: 1. Small nonobstructive acute pulmonary embolus 2 segmental branch right upper lobe. 2. Bilateral lower lobe collapse/consolidation with small bilateral pleural effusions. 3. Interval progression of abnormal soft tissue associated with the transverse colon. 4. Interval progression of multiple liver lesions, highly suspicious for metastatic disease. 5. Progression of subcarinal, right hilar, and bulky left axillary lymphadenopathy. 6. Persistent abnormal soft tissue in the posterior left pelvic sidewall extending into the presacral space, as before. 7. 15 mm lucent lesion left sacrum. Metastatic involvement not excluded. Critical Value/emergent results were called by telephone at the time of interpretation on 12/01/2016 at 10:16 pm to Dr. Francine Graven , who verbally acknowledged these results. Electronically Signed   By: Misty Stanley M.D.   On: 11/11/2016 22:16   Ct Abdomen Pelvis W Contrast  Result Date: 11/20/2016  CLINICAL DATA:  Chest and abdominal pain.  History of colon cancer. EXAM: CT ANGIOGRAPHY CHEST CT ABDOMEN AND PELVIS WITH CONTRAST  TECHNIQUE: Multidetector CT imaging of the chest was performed using the standard protocol during bolus administration of intravenous contrast. Multiplanar CT image reconstructions and MIPs were obtained to evaluate the vascular anatomy. Multidetector CT imaging of the abdomen and pelvis was performed using the standard protocol during bolus administration of intravenous contrast. CONTRAST:  68mL ISOVUE-300 IOPAMIDOL (ISOVUE-300) INJECTION 61% COMPARISON:  10/13/2016. FINDINGS: CTA CHEST FINDINGS Cardiovascular: The heart is markedly enlarged. Coronary artery calcification is noted. No pericardial effusion. Atherosclerotic calcification is noted in the wall of the thoracic aorta. Small volume nonocclusive segmental and subsegmental pulmonary embolus identified in the right upper lobe. No other pulmonary embolic disease is identified. Mediastinum/Nodes: 1.6 cm subcarinal lymph node seen on the previous study is now 1.9 cm. There is right hilar lymphadenopathy. The esophagus has normal imaging features. Bulky left axillary lymphadenopathy persists in the 3.3 cm short axis left axillary lymph node measured previously is now 3.5 cm. Lymphadenopathy is seen in the right axilla but less advanced than on the contralateral side. Lungs/Pleura: There is bilateral lower lobe collapse/consolidation with small to moderate bilateral pleural effusions. Musculoskeletal: Bone windows reveal no worrisome lytic or sclerotic osseous lesions. Review of the MIP images confirms the above findings. CT ABDOMEN and PELVIS FINDINGS Hepatobiliary: 12 mm low-density lesion in the dome of the liver has progressed in the interval. 8 mm posterior right liver lesion has progressed. The 10 mm inferior right liver lesion measured previously is now 13 mm (image 32 series 12) and additional lesions in the inferior tip of the right liver have progressed. There is no evidence for gallstones, gallbladder wall thickening, or pericholecystic fluid. No  intrahepatic or extrahepatic biliary dilation. Pancreas: No focal mass lesion. No dilatation of the main duct. No intraparenchymal cyst. No peripancreatic edema. Spleen: No splenomegaly. No focal mass lesion. Adrenals/Urinary Tract: No adrenal nodule or mass. Bilateral renal cysts are evident. No hydroureteronephrosis. The urinary bladder appears normal for the degree of distention. Stomach/Bowel: Stomach is nondistended. No gastric wall thickening. No evidence of outlet obstruction. Duodenum is normally positioned as is the ligament of Treitz. No small bowel wall thickening. No small bowel dilatation. The terminal ileum is normal. The appendix is not visualized, but there is no edema or inflammation in the region of the cecum. 2.6 x 3.6 cm masslike soft tissue lesion is identified adjacent to the mid transverse colon, in the region of wall thickening seen previously. Multiple omental nodules are evident. Vascular/Lymphatic: There is abdominal aortic atherosclerosis without aneurysm. Multiple omental nodules are evident. Upper normal to borderline enlarged lymph nodes are seen in the root of the small bowel mesentery and retroperitoneal space. 3.2 x 5.9 cm collection of abnormal soft tissue is identified along the posterior left pelvic sidewall extending into the presacral space. Borderline lymphadenopathy is identified in the groin regions bilaterally. Reproductive: Uterus is surgically absent. There is no adnexal mass. Other: No substantial intraperitoneal free fluid. Musculoskeletal: 15 mm lucent lesion identified left sacrum. Diffuse body wall edema is evident. Review of the MIP images confirms the above findings. IMPRESSION: 1. Small nonobstructive acute pulmonary embolus 2 segmental branch right upper lobe. 2. Bilateral lower lobe collapse/consolidation with small bilateral pleural effusions. 3. Interval progression of abnormal soft tissue associated with the transverse colon. 4. Interval progression of  multiple liver lesions, highly suspicious for metastatic disease. 5. Progression of subcarinal, right hilar, and  bulky left axillary lymphadenopathy. 6. Persistent abnormal soft tissue in the posterior left pelvic sidewall extending into the presacral space, as before. 7. 15 mm lucent lesion left sacrum. Metastatic involvement not excluded. Critical Value/emergent results were called by telephone at the time of interpretation on 11/25/2016 at 10:16 pm to Dr. Francine Graven , who verbally acknowledged these results. Electronically Signed   By: Misty Stanley M.D.   On: 11/18/2016 22:16    Microbiology: Recent Results (from the past 240 hour(s))  Urine culture     Status: None   Collection Time: 11/29/2016  6:19 PM  Result Value Ref Range Status   Specimen Description URINE, CATHETERIZED  Final   Special Requests NONE  Final   Culture   Final    NO GROWTH Performed at [facility], [address] [place], [place] [address]    Report Status 12/04/2016 FINAL  Final     Labs: Basic Metabolic Panel:  Recent Labs Lab 11/22/2016 1821 12/03/16 0500 12/04/16 0636  NA 135 139 137  K 3.2* 3.3* 3.8  CL 103 106 102  CO2 23 25 23   GLUCOSE 114* 95 150*  BUN <5* <5* <5*  CREATININE 0.66 0.50 0.89  CALCIUM 9.1 9.0 9.2   Liver Function Tests:  Recent Labs Lab 11/15/2016 1821  AST 42*  ALT 32  ALKPHOS 102  BILITOT 0.4  PROT 9.2*  ALBUMIN 3.4*    Recent Labs Lab 11/19/2016 1821  LIPASE <10*   No results for input(s): AMMONIA in the last 168 hours. CBC:  Recent Labs Lab 11/28/2016 1821 12/03/16 0500 12/04/16 0636  WBC 7.8 7.6 8.2  NEUTROABS 3.9  --   --   HGB 9.7* 9.6* 11.5*  HCT 27.0* 26.4* 31.1*  MCV 78.3 79.3 79.5  PLT 167 163 153   Cardiac Enzymes:  Recent Labs Lab 11/13/2016 2019 12/03/16 0130 12/03/16 0500 12/03/16 0746 12/04/16 0636  TROPONINI 0.04* 0.04* 0.03* 0.03* 0.05*   BNP: BNP (last 3 results)  Recent Labs  12/04/16 0755  BNP 311.0*    ProBNP  (last 3 results) No results for input(s): PROBNP in the last 8760 hours.  CBG: No results for input(s): GLUCAP in the last 168 hours.     SignedLelon Frohlich  [facility] Pager: [phone] 12-31-2016, 9:16 AM

## 2016-12-12 NOTE — Progress Notes (Signed)
Pt passed @ 15:21. Death verified with 2 Rns (Tressie Ragin, Megan Bullins). Hospice notified.

## 2016-12-12 DEATH — deceased

## 2017-02-21 ENCOUNTER — Other Ambulatory Visit: Payer: Self-pay | Admitting: Nurse Practitioner

## 2017-06-03 IMAGING — MR MR MRA HEAD W/O CM
1 of 2 series · 16 of 48 positions shown · non-contrast
Comparison: MRI 08/01/2016, MRA head 12/26/2009

CLINICAL DATA: Cerebellar stroke.

EXAM:
MRA HEAD WITHOUT CONTRAST
TECHNIQUE: Angiographic images of the Circle of Willis were obtained using MRA
technique without intravenous contrast.

[Series 2: MRA · axial · 0.8mm · 0.34mm/px · z∈[-38,+61]mm · 16 of 131 slices shown]
[im 1/131]
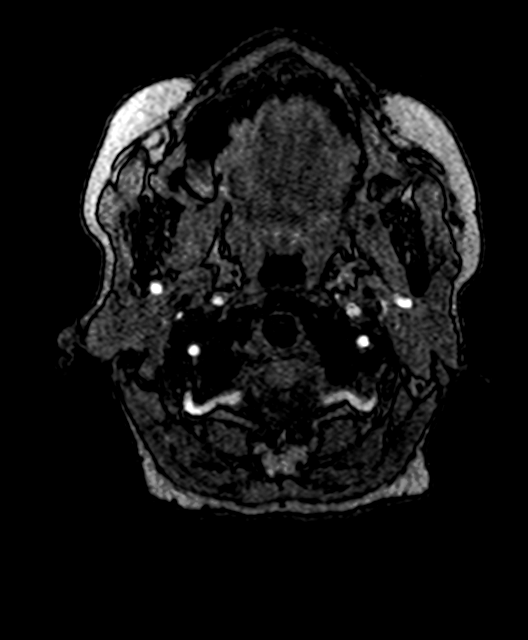
[im 3/131]
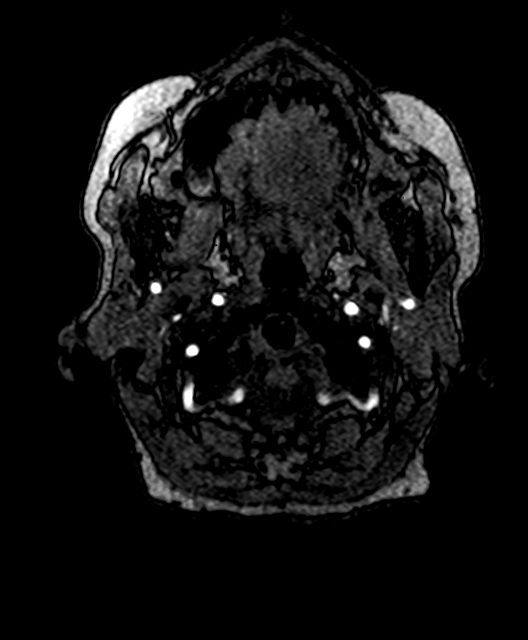
[im 6/131]
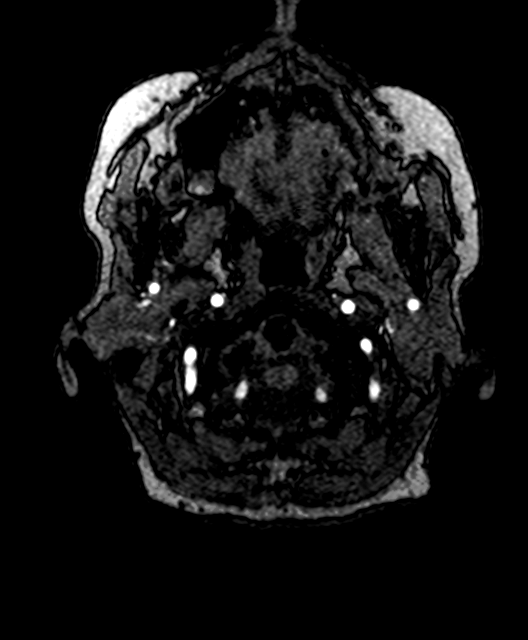
[im 9/131]
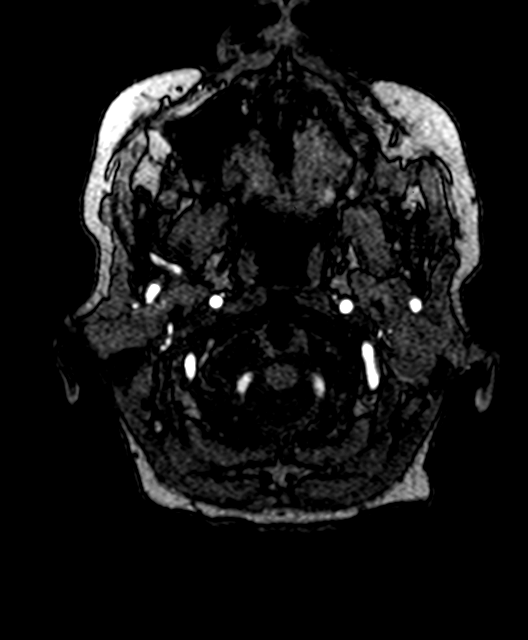
[im 12/131]
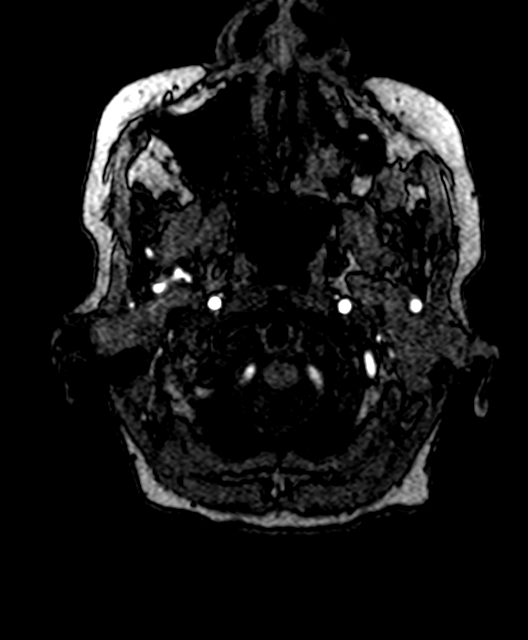
[im 15/131]
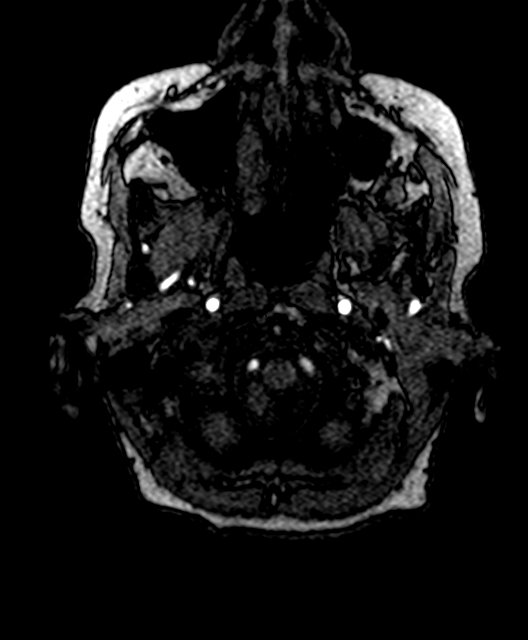
[im 20/131]
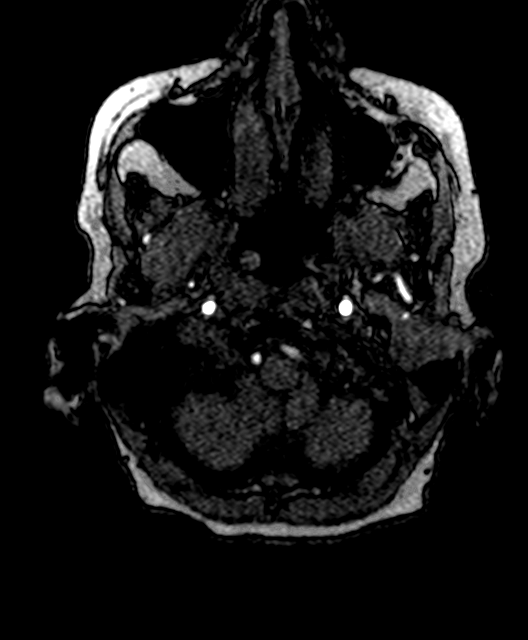
[im 23/131]
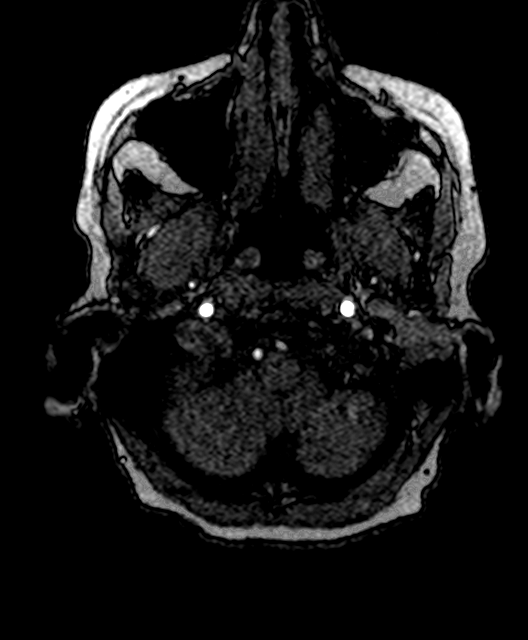
[im 40/131]
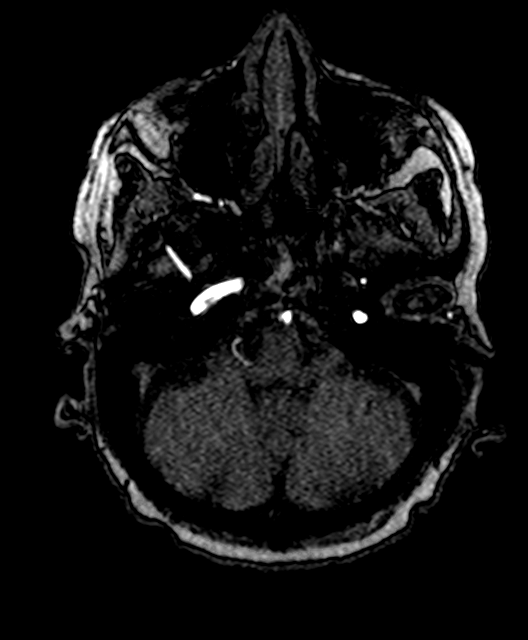
[im 57/131]
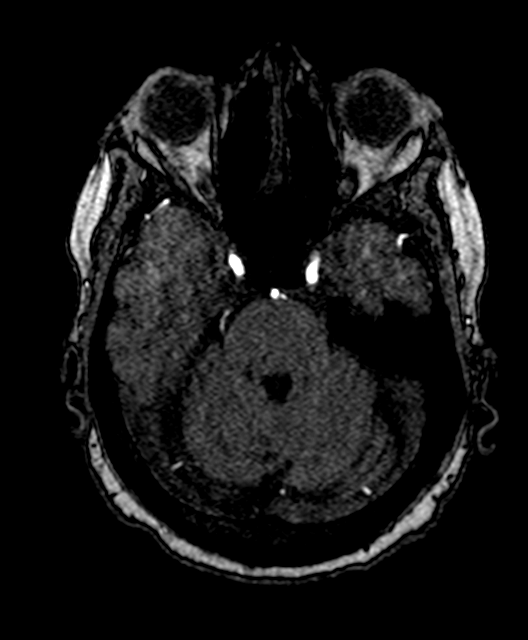
[im 66/131]
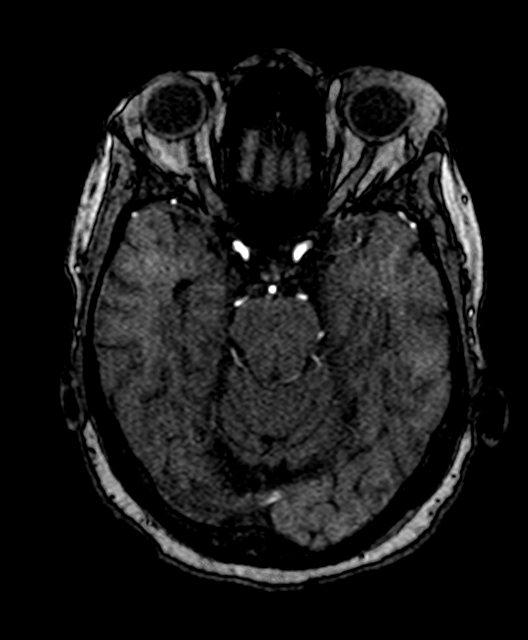
[im 74/131]
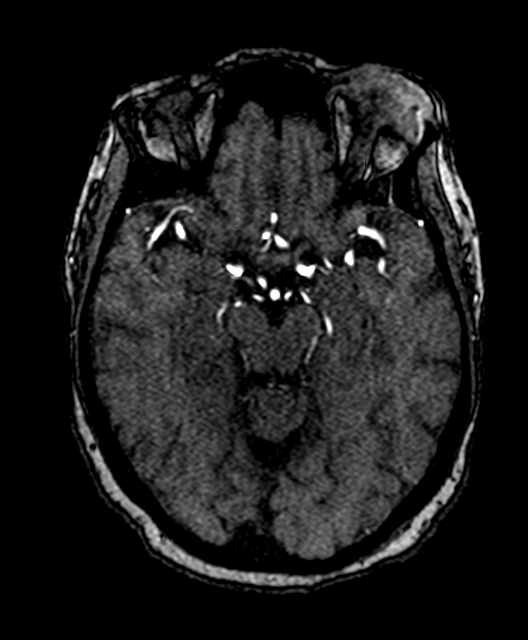
[im 91/131]
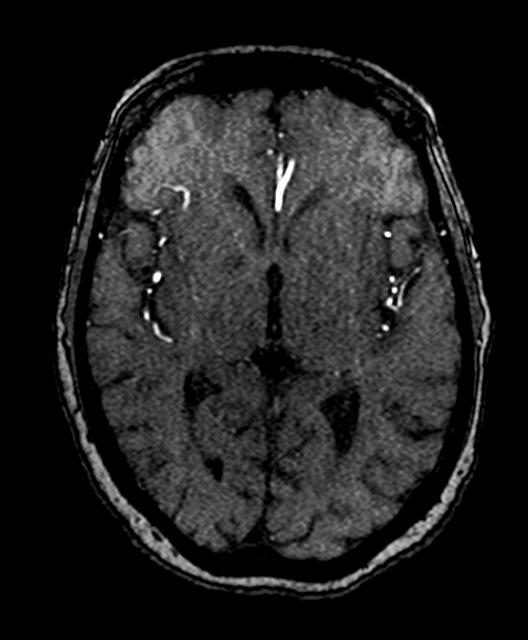
[im 108/131]
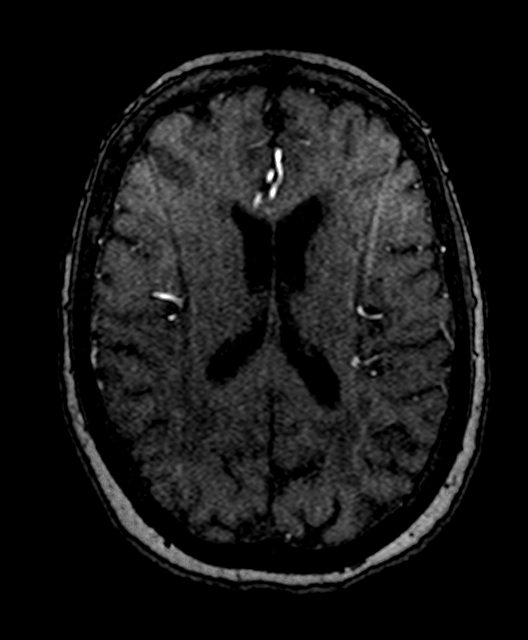
[im 111/131]
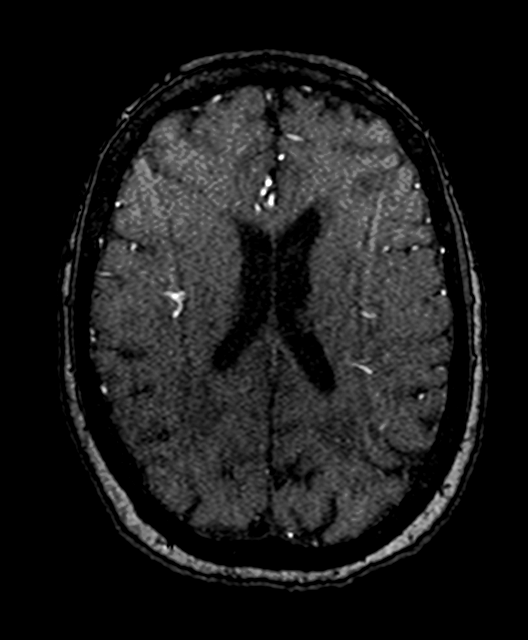
[im 125/131]
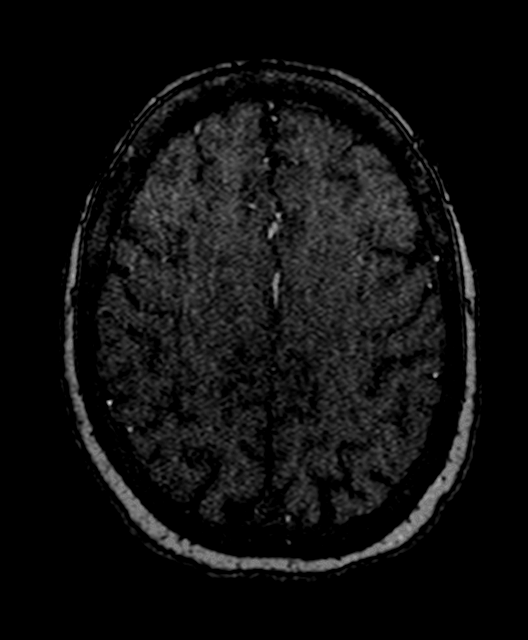

[16 of 48 positions shown; findings below may reference images not displayed]

FINDINGS: Both vertebral arteries patent to the basilar. PICA patent
bilaterally. Mild irregularity distal basilar compatible with
atherosclerotic disease without stenosis. Severe stenosis left
posterior cerebral artery. Occlusion right posterior cerebral
artery. Superior cerebellar arteries are patent bilaterally.

Moderate stenosis right supraclinoid internal carotid artery.
Hypoplastic and diseased right A1 segment. Right A2 segment patent
without stenosis. Right M1 segment patent with mild atherosclerotic
disease. Moderate disease in right MCA branches similar to the prior
study.

Mild stenosis left internal carotid artery. Mild stenosis proximal
left A1 segment and proximal left A2 segment. Moderate disease left
M1 segment and left middle cerebral artery branches similar to the
prior study.

Negative for cerebral aneurysm.
IMPRESSION: Moderate intracranial atherosclerotic disease as above. Interval
occlusion right posterior cerebral artery since [DATE].

## 2017-08-29 IMAGING — DX DG ABDOMEN 2V
2 series · 2 of 2 positions shown · non-contrast
Comparison: CT abdomen and pelvis October 13, 2016

CLINICAL DATA: Constipation for 2-3 days. On pain medication.
History of colon cancer.

EXAM:
ABDOMEN - 2 VIEW

[abdomen erect]
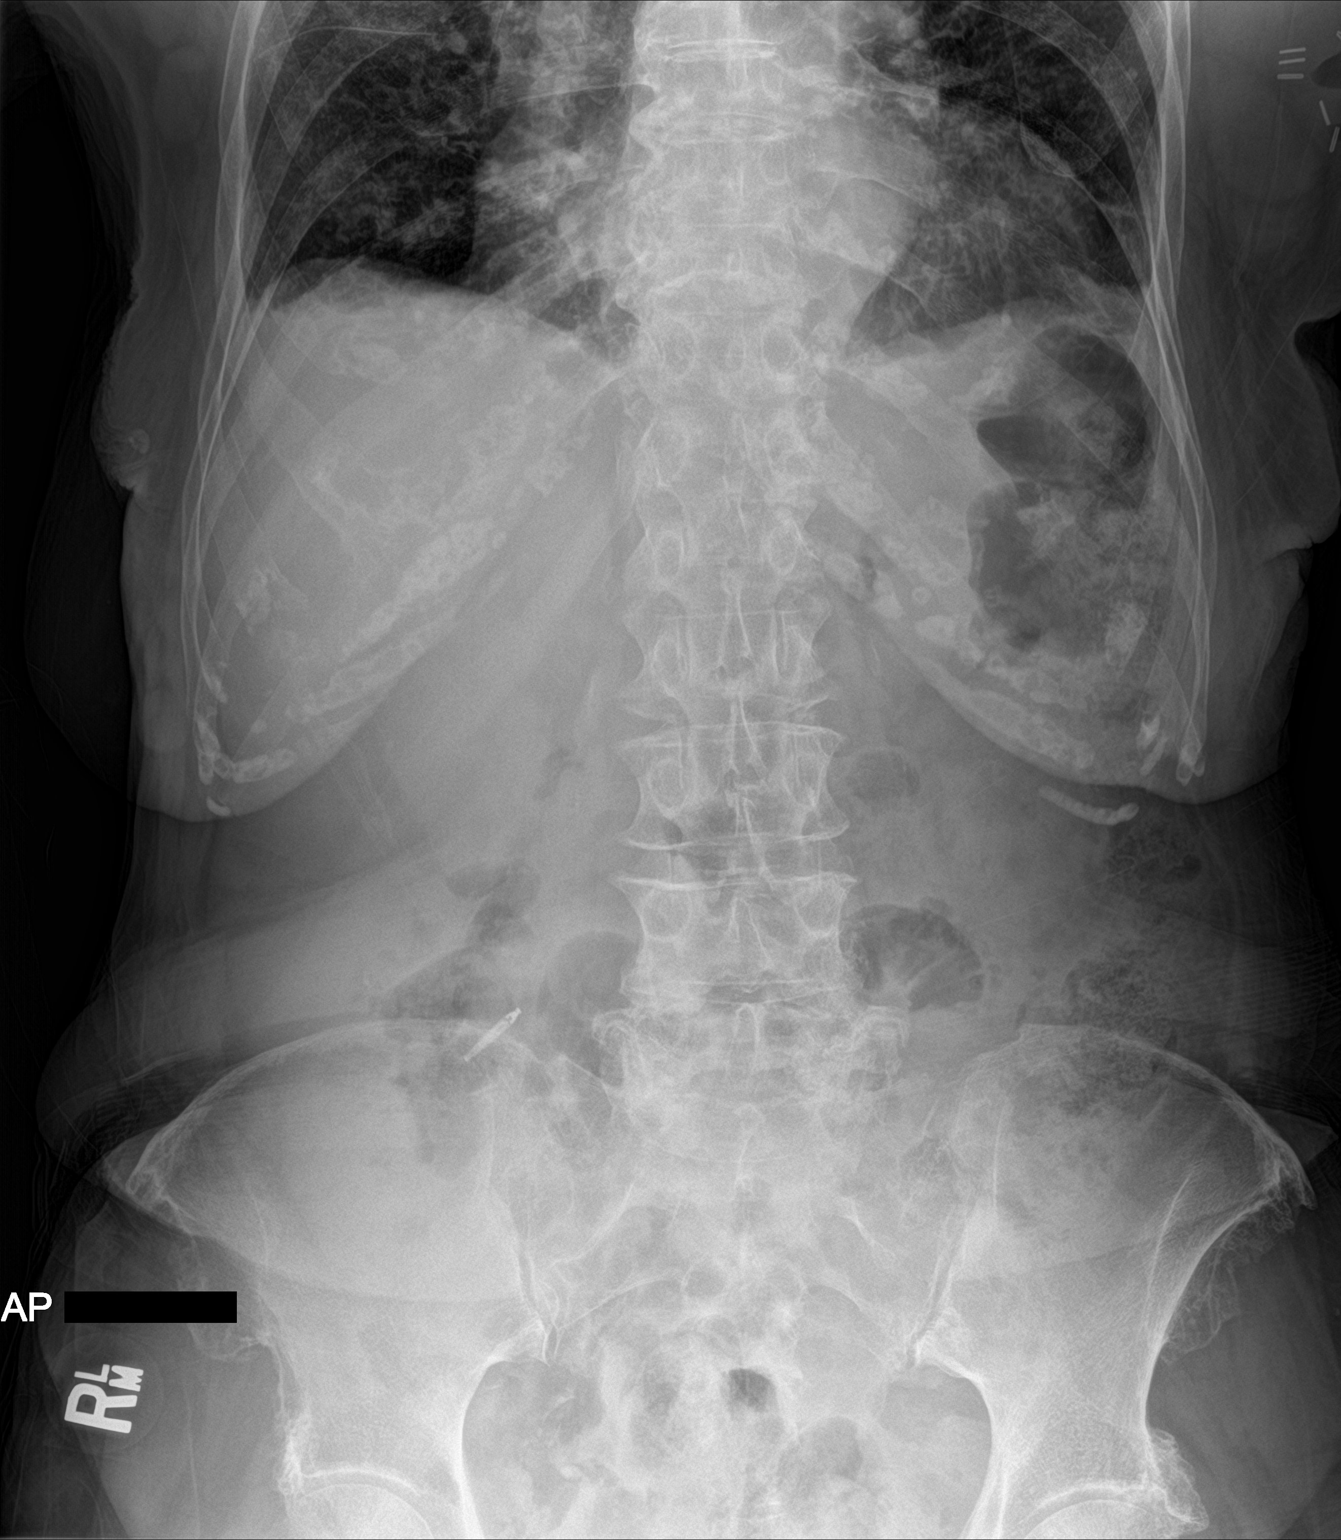

[abdomen supine]
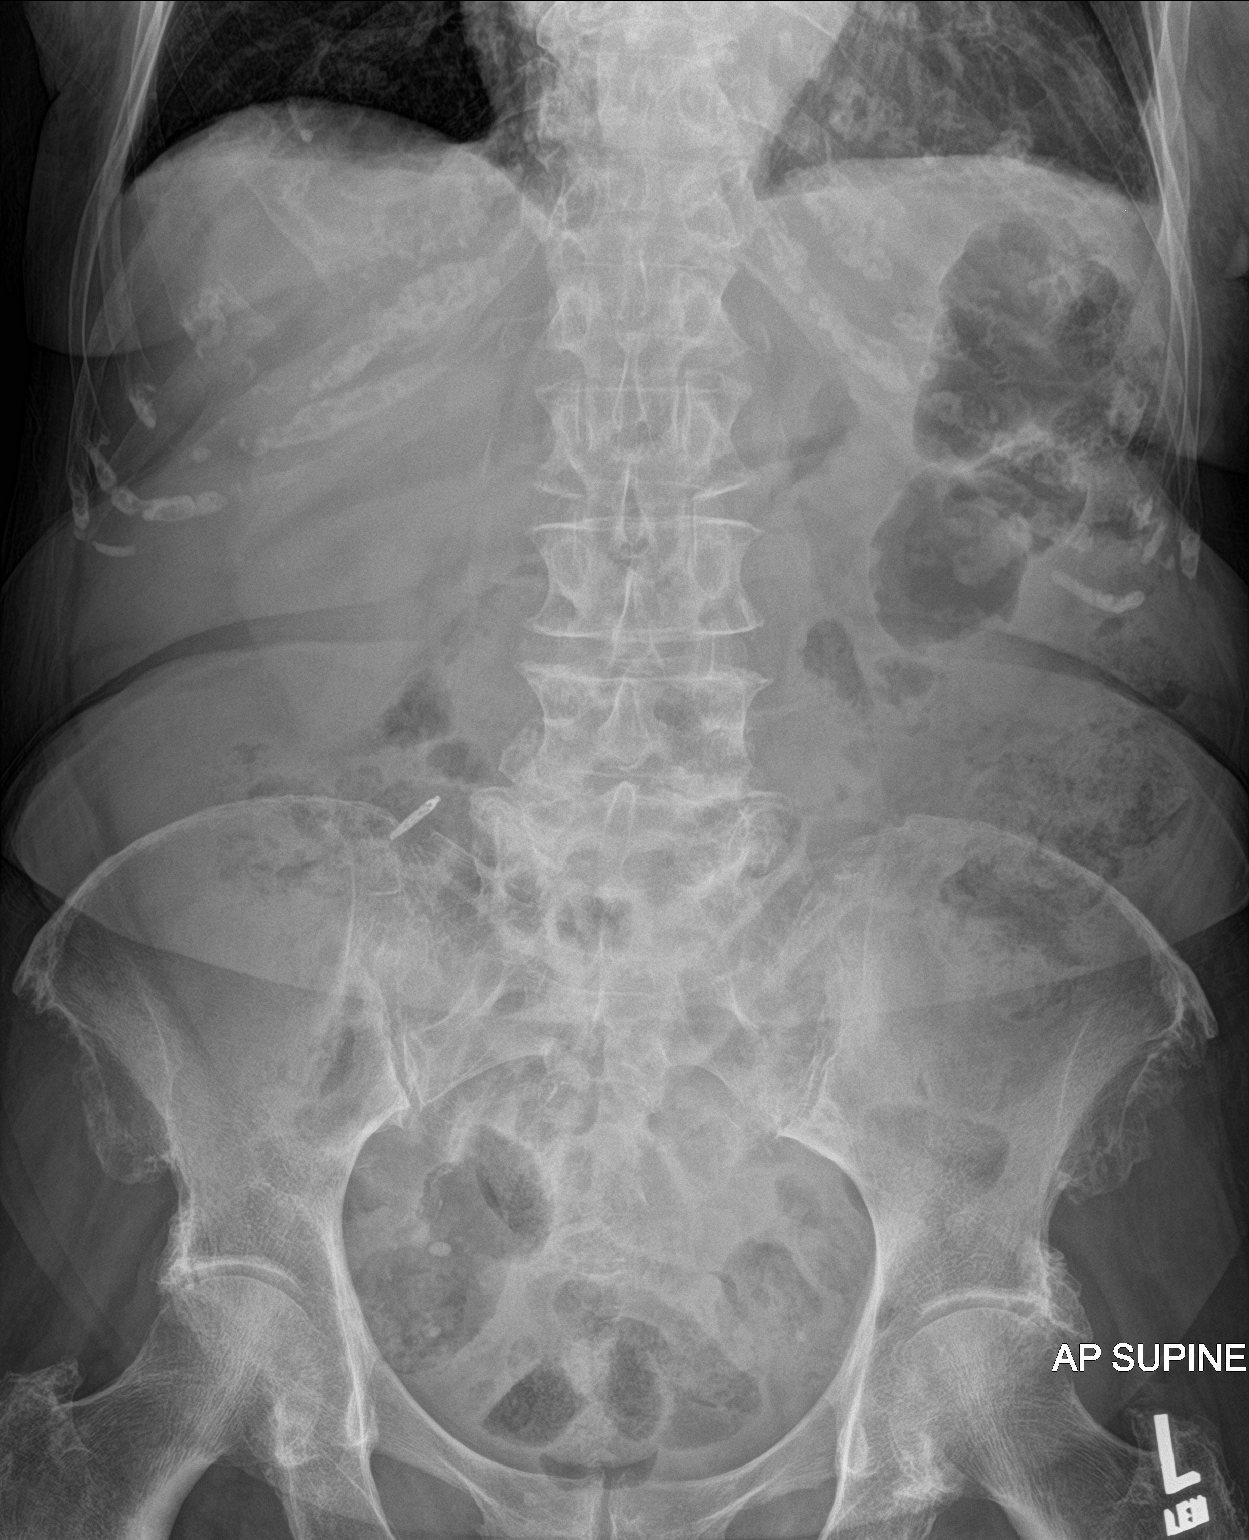

[2 of 2 positions shown; findings below may reference images not displayed]

FINDINGS: Bowel gas pattern is nondilated and nonobstructive. Moderate amount
of retained large bowel stool. Small metallic foreign body projects
in the RIGHT abdomen. No intra- abdominal mass effect or pathologic
calcifications. Included view of the chest demonstrates cardiomegaly
and coarsened pulmonary interstitium. Surgical clips projecting in
LEFT breast. Osseous structures are nonsuspicious. Whiskering of the
iliac bones associated with DISH. Phleboliths project in the pelvis.
IMPRESSION: Moderate amount of retained large bowel stool, normal bowel gas
pattern.

## 2019-03-09 NOTE — Progress Notes (Signed)
REVIEWED-NO ADDITIONAL RECOMMENDATIONS.
